# Patient Record
Sex: Male | Born: 1945 | Race: White | Hispanic: No | Marital: Married | Smoking: Former smoker
Health system: Southern US, Community
[De-identification: ages and names within clinical notes are randomized; demographics above are authoritative.]

## PROBLEM LIST (undated history)

## (undated) DIAGNOSIS — K5790 Diverticulosis of intestine, part unspecified, without perforation or abscess without bleeding: Secondary | ICD-10-CM

## (undated) DIAGNOSIS — M797 Fibromyalgia: Secondary | ICD-10-CM

## (undated) DIAGNOSIS — H269 Unspecified cataract: Secondary | ICD-10-CM

## (undated) DIAGNOSIS — K219 Gastro-esophageal reflux disease without esophagitis: Secondary | ICD-10-CM

## (undated) DIAGNOSIS — E785 Hyperlipidemia, unspecified: Secondary | ICD-10-CM

## (undated) DIAGNOSIS — Z905 Acquired absence of kidney: Secondary | ICD-10-CM

## (undated) DIAGNOSIS — N400 Enlarged prostate without lower urinary tract symptoms: Secondary | ICD-10-CM

## (undated) DIAGNOSIS — I10 Essential (primary) hypertension: Secondary | ICD-10-CM

## (undated) DIAGNOSIS — K635 Polyp of colon: Secondary | ICD-10-CM

## (undated) DIAGNOSIS — J45909 Unspecified asthma, uncomplicated: Secondary | ICD-10-CM

## (undated) HISTORY — DX: Hyperlipidemia, unspecified: E78.5

## (undated) HISTORY — DX: Essential (primary) hypertension: I10

## (undated) HISTORY — PX: OTHER SURGICAL HISTORY: SHX169

## (undated) HISTORY — DX: Acquired absence of kidney: Z90.5

## (undated) HISTORY — PX: APPENDECTOMY: SHX54

## (undated) HISTORY — DX: Polyp of colon: K63.5

## (undated) HISTORY — DX: Gastro-esophageal reflux disease without esophagitis: K21.9

## (undated) HISTORY — DX: Diverticulosis of intestine, part unspecified, without perforation or abscess without bleeding: K57.90

## (undated) HISTORY — PX: EYE SURGERY: SHX253

## (undated) HISTORY — PX: COLONOSCOPY W/ POLYPECTOMY: SHX1380

## (undated) HISTORY — DX: Unspecified asthma, uncomplicated: J45.909

## (undated) HISTORY — DX: Fibromyalgia: M79.7

## (undated) HISTORY — DX: Benign prostatic hyperplasia without lower urinary tract symptoms: N40.0

## (undated) HISTORY — PX: CHOLECYSTECTOMY: SHX55

## (undated) HISTORY — DX: Unspecified cataract: H26.9

## (undated) HISTORY — PX: ROTATOR CUFF REPAIR: SHX139

---

## 1994-04-03 HISTORY — PX: NEPHRECTOMY: SHX65

## 2010-09-08 ENCOUNTER — Encounter: Payer: Self-pay | Admitting: Physician Assistant

## 2010-09-09 ENCOUNTER — Encounter: Payer: Self-pay | Admitting: Physician Assistant

## 2011-04-06 DIAGNOSIS — E559 Vitamin D deficiency, unspecified: Secondary | ICD-10-CM | POA: Diagnosis not present

## 2011-04-06 DIAGNOSIS — E119 Type 2 diabetes mellitus without complications: Secondary | ICD-10-CM | POA: Diagnosis not present

## 2011-04-07 DIAGNOSIS — E119 Type 2 diabetes mellitus without complications: Secondary | ICD-10-CM | POA: Diagnosis not present

## 2011-07-28 DIAGNOSIS — N400 Enlarged prostate without lower urinary tract symptoms: Secondary | ICD-10-CM | POA: Diagnosis not present

## 2011-08-02 DIAGNOSIS — E109 Type 1 diabetes mellitus without complications: Secondary | ICD-10-CM | POA: Diagnosis not present

## 2011-08-02 DIAGNOSIS — H2510 Age-related nuclear cataract, unspecified eye: Secondary | ICD-10-CM | POA: Diagnosis not present

## 2011-08-02 DIAGNOSIS — H40019 Open angle with borderline findings, low risk, unspecified eye: Secondary | ICD-10-CM | POA: Diagnosis not present

## 2011-10-09 DIAGNOSIS — IMO0001 Reserved for inherently not codable concepts without codable children: Secondary | ICD-10-CM | POA: Diagnosis not present

## 2011-10-09 DIAGNOSIS — I10 Essential (primary) hypertension: Secondary | ICD-10-CM | POA: Diagnosis not present

## 2011-10-09 DIAGNOSIS — E785 Hyperlipidemia, unspecified: Secondary | ICD-10-CM | POA: Diagnosis not present

## 2011-10-09 DIAGNOSIS — E559 Vitamin D deficiency, unspecified: Secondary | ICD-10-CM | POA: Diagnosis not present

## 2012-01-10 DIAGNOSIS — E785 Hyperlipidemia, unspecified: Secondary | ICD-10-CM | POA: Diagnosis not present

## 2012-01-10 DIAGNOSIS — IMO0001 Reserved for inherently not codable concepts without codable children: Secondary | ICD-10-CM | POA: Diagnosis not present

## 2012-01-10 DIAGNOSIS — R05 Cough: Secondary | ICD-10-CM | POA: Diagnosis not present

## 2012-01-10 DIAGNOSIS — R059 Cough, unspecified: Secondary | ICD-10-CM | POA: Diagnosis not present

## 2012-01-10 DIAGNOSIS — Z23 Encounter for immunization: Secondary | ICD-10-CM | POA: Diagnosis not present

## 2012-01-10 DIAGNOSIS — I10 Essential (primary) hypertension: Secondary | ICD-10-CM | POA: Diagnosis not present

## 2012-04-16 DIAGNOSIS — E559 Vitamin D deficiency, unspecified: Secondary | ICD-10-CM | POA: Diagnosis not present

## 2012-04-16 DIAGNOSIS — Z125 Encounter for screening for malignant neoplasm of prostate: Secondary | ICD-10-CM | POA: Diagnosis not present

## 2012-04-16 DIAGNOSIS — I10 Essential (primary) hypertension: Secondary | ICD-10-CM | POA: Diagnosis not present

## 2012-04-16 DIAGNOSIS — M109 Gout, unspecified: Secondary | ICD-10-CM | POA: Diagnosis not present

## 2012-04-16 DIAGNOSIS — E785 Hyperlipidemia, unspecified: Secondary | ICD-10-CM | POA: Diagnosis not present

## 2012-04-16 DIAGNOSIS — IMO0001 Reserved for inherently not codable concepts without codable children: Secondary | ICD-10-CM | POA: Diagnosis not present

## 2012-04-16 DIAGNOSIS — N400 Enlarged prostate without lower urinary tract symptoms: Secondary | ICD-10-CM | POA: Diagnosis not present

## 2012-07-22 ENCOUNTER — Encounter: Payer: Self-pay | Admitting: Family Medicine

## 2012-07-22 ENCOUNTER — Encounter: Payer: Self-pay | Admitting: *Deleted

## 2012-07-22 ENCOUNTER — Ambulatory Visit (INDEPENDENT_AMBULATORY_CARE_PROVIDER_SITE_OTHER): Payer: Medicare Other | Admitting: Family Medicine

## 2012-07-22 VITALS — BP 125/82 | HR 61 | Temp 97.8°F | Ht 67.0 in | Wt 186.8 lb

## 2012-07-22 DIAGNOSIS — Z905 Acquired absence of kidney: Secondary | ICD-10-CM

## 2012-07-22 DIAGNOSIS — I10 Essential (primary) hypertension: Secondary | ICD-10-CM

## 2012-07-22 DIAGNOSIS — Q602 Renal agenesis, unspecified: Secondary | ICD-10-CM | POA: Diagnosis not present

## 2012-07-22 DIAGNOSIS — M109 Gout, unspecified: Secondary | ICD-10-CM

## 2012-07-22 DIAGNOSIS — Q605 Renal hypoplasia, unspecified: Secondary | ICD-10-CM

## 2012-07-22 DIAGNOSIS — E119 Type 2 diabetes mellitus without complications: Secondary | ICD-10-CM

## 2012-07-22 DIAGNOSIS — E8881 Metabolic syndrome: Secondary | ICD-10-CM

## 2012-07-22 DIAGNOSIS — E785 Hyperlipidemia, unspecified: Secondary | ICD-10-CM

## 2012-07-22 LAB — HEPATIC FUNCTION PANEL
ALT: 13 U/L (ref 0–53)
AST: 10 U/L (ref 0–37)
Albumin: 4 g/dL (ref 3.5–5.2)
Alkaline Phosphatase: 71 U/L (ref 39–117)
Bilirubin, Direct: 0.1 mg/dL (ref 0.0–0.3)
Indirect Bilirubin: 0.3 mg/dL (ref 0.0–0.9)
Total Bilirubin: 0.4 mg/dL (ref 0.3–1.2)
Total Protein: 6.9 g/dL (ref 6.0–8.3)

## 2012-07-22 LAB — BASIC METABOLIC PANEL WITH GFR
BUN: 21 mg/dL (ref 6–23)
CO2: 27 mEq/L (ref 19–32)
Calcium: 9.5 mg/dL (ref 8.4–10.5)
Chloride: 103 mEq/L (ref 96–112)
Creat: 1.28 mg/dL (ref 0.50–1.35)
GFR, Est African American: 67 mL/min
GFR, Est Non African American: 58 mL/min — ABNORMAL LOW
Glucose, Bld: 208 mg/dL — ABNORMAL HIGH (ref 70–99)
Potassium: 5.2 mEq/L (ref 3.5–5.3)
Sodium: 138 mEq/L (ref 135–145)

## 2012-07-22 LAB — URIC ACID: Uric Acid, Serum: 5.6 mg/dL (ref 4.0–6.0)

## 2012-07-22 LAB — POCT UA - MICROALBUMIN: Microalbumin Ur, POC: POSITIVE mg/dL

## 2012-07-22 NOTE — Progress Notes (Signed)
Patient ID: Jeffrey Rubio, male   DOB: Nov 11, 1945, 67 y.o.   MRN: [id number] SUBJECTIVE: HPI: Patient is here for follow up of Diabetes Mellitus/ Hyperlipidemia/hypertension. .Symptoms of DM:has had no Nocturia ,deniesUrinary Frequency ,denies Blurred vision ,deniesDizziness,denies.Dysuria,deniesparesthesias, deniesextremity pain or ulcers.Marland Kitchendenieschest pain. .has hadan annual eye exam. do check the feet. doescheck CBGs. Average CBG:_216_______.Marland Kitchen deniesto episodes of hypoglycemia. doeshave an emergency hypoglycemic plan. admits toCompliance with medications. deniesProblems with medications. Denies compliance with diet. Loves to eat.   PMH/PSH: reviewed/updated in Epic  SH/FH: reviewed/updated in Epic  Allergies: reviewed/updated in Epic  Medications: reviewed/updated in Epic  Immunizations: reviewed/updated in Epic  ROS: As above in the HPI. All other systems are stable or negative.  OBJECTIVE: APPEARANCE:  Patient in no acute distress.The patient appeared well nourished and normally developed. Acyanotic. Waist: 40 3/4 inches, central obesity VITAL SIGNS:BP 125/82  Pulse 61  Temp(Src) 97.8 F (36.6 C) (Oral)  Ht 5\' 7"  (1.702 m)  Wt 186 lb 12.8 oz (84.732 kg)  BMI 29.25 kg/m2   SKIN: warm and  Dry without overt rashes, tattoos and scars  HEAD and Neck: without JVD, Head and scalp: normal Eyes:No scleral icterus. Fundi normal, eye movements normal. Ears: Auricle normal, canal normal, Tympanic membranes normal, insufflation normal. Nose: normal Throat: normal Neck & thyroid: normal  CHEST & LUNGS: Chest wall: normal Lungs: Clear  CVS: Reveals the PMI to be normally located. Regular rhythm, First and Second Heart sounds are normal,  absence of murmurs, rubs or gallops. Peripheral vasculature: Radial pulses: normal Dorsal pedis pulses: normal Posterior pulses: normal  ABDOMEN:  Appearance: normal Benign,, no organomegaly, no masses, no Abdominal Aortic  enlargement. No Guarding , no rebound. No Bruits. Bowel sounds: normal  RECTAL: N/A GU: N/A  EXTREMETIES: nonedematous. Both Femoral and Pedal pulses are normal.  MUSCULOSKELETAL:  Spine: normal Joints: intact  NEUROLOGIC: oriented to time,place and person; nonfocal. Strength is normal Sensory is normal Reflexes are normal Cranial Nerves are normal.  ASSESSMENT: Type II or unspecified type diabetes mellitus without mention of complication, not stated as uncontrolled - Plan: POCT glycosylated hemoglobin (Hb A1C), POCT UA - Microalbumin, Microalbumin, urine  Single kidney  HTN (hypertension) - Plan: BASIC METABOLIC PANEL WITH GFR  HLD (hyperlipidemia) - Plan: Hepatic function panel, NMR Lipoprofile with Lipids  Gout - Plan: Uric acid  Metabolic syndrome  When he was young he had stones in his kidney and it was barely surviving and  Eventually it died.  PLAN: Orders Placed This Encounter  Procedures  . BASIC METABOLIC PANEL WITH GFR  . Hepatic function panel  . NMR Lipoprofile with Lipids  . Uric acid  . Microalbumin, urine  . POCT glycosylated hemoglobin (Hb A1C)  . POCT UA - Microalbumin   Results for orders placed in visit on 07/22/12 (from the past 24 hour(s))  POCT UA - MICROALBUMIN     Status: None   Collection Time    07/22/12 10:34 AM      Result Value Range   Microalbumin Ur, POC positive     Meds ordered this encounter  Medications  . amLODipine (NORVASC) 5 MG tablet    Sig:   . benazepril (LOTENSIN) 10 MG tablet    Sig:   . pioglitazone (ACTOS) 15 MG tablet    Sig:   . JANUVIA 100 MG tablet    Sig:   . traMADol (ULTRAM) 50 MG tablet    Sig:   counselled for 35 minutes on diet exercise and weight loss,  and therapeutic lifestyle changes. Handouts given in the AVS and  Discussed these as well. Annual eye exam. Daily foot checks. RTC in 3 months. Await labs.      Dr Woodroe Mode Recommendations  Diet and Exercise discussed with  patient.  For nutrition information, I recommend books:  1).Eat to Live by Dr Monico Hoar. 2).Prevent and Reverse Heart Disease by Dr Suzzette Righter. 3.) Reversing Diabetes by Dr Lequita Asal. Exercise recommendations are:  If unable to walk, then the patient can exercise in a chair 3 times a day. By flapping arms like a bird gently and raising legs outwards to the front.  If ambulatory, the patient can go for walks for 30 minutes 3 times a week. Then increase the intensity and duration as tolerated.  Goal is to try to attain exercise frequency to 5 times a week.  If applicable: Best to perform resistance exercises (machines or weights) 2 days a week and cardio type exercises 3 days per week.  Omega Durante P. [place] Charon, M.D.

## 2012-07-22 NOTE — Patient Instructions (Addendum)
Dr Woodroe Mode Recommendations  Diet and Exercise discussed with patient.  For nutrition information, I recommend books:  1).Eat to Live by Dr Monico Hoar. 2).Prevent and Reverse Heart Disease by Dr Suzzette Righter. 3.) Reversing Diabetes by Dr Lequita Asal. Exercise recommendations are:  If unable to walk, then the patient can exercise in a chair 3 times a day. By flapping arms like a bird gently and raising legs outwards to the front.  If ambulatory, the patient can go for walks for 30 minutes 3 times a week. Then increase the intensity and duration as tolerated.  Goal is to try to attain exercise frequency to 5 times a week.  If applicable: Best to perform resistance exercises (machines or weights) 2 days a week and cardio type exercises 3 days per week.  Diabetes and Exercise Regular exercise is important and can help:   Control blood glucose (sugar).  Decrease blood pressure.    Control blood lipids (cholesterol, triglycerides).  Improve overall health. BENEFITS FROM EXERCISE  Improved fitness.  Improved flexibility.  Improved endurance.  Increased bone density.  Weight control.  Increased muscle strength.  Decreased body fat.  Improvement of the body's use of insulin, a hormone.  Increased insulin sensitivity.  Reduction of insulin needs.  Reduced stress and tension.  Helps you feel better. People with diabetes who add exercise to their lifestyle gain additional benefits, including:  Weight loss.  Reduced appetite.  Improvement of the body's use of blood glucose.  Decreased risk factors for heart disease:  Lowering of cholesterol and triglycerides.  Raising the level of good cholesterol (high-density lipoproteins, HDL).  Lowering blood sugar.  Decreased blood pressure. TYPE 1 DIABETES AND EXERCISE  Exercise will usually lower your blood glucose.  If blood glucose is greater than 240 mg/dl, check urine ketones. If ketones  are present, do not exercise.  Location of the insulin injection sites may need to be adjusted with exercise. Avoid injecting insulin into areas of the body that will be exercised. For example, avoid injecting insulin into:  The arms when playing tennis.  The legs when jogging. For more information, discuss this with your caregiver.  Keep a record of:  Food intake.  Type and amount of exercise.  Expected peak times of insulin action.  Blood glucose levels. Do this before, during, and after exercise. Review your records with your caregiver. This will help you to develop guidelines for adjusting food intake and insulin amounts.  TYPE 2 DIABETES AND EXERCISE  Regular physical activity can help control blood glucose.  Exercise is important because it may:  Increase the body's sensitivity to insulin.  Improve blood glucose control.  Exercise reduces the risk of heart disease. It decreases serum cholesterol and triglycerides. It also lowers blood pressure.  Those who take insulin or oral hypoglycemic agents should watch for signs of hypoglycemia. These signs include dizziness, shaking, sweating, chills, and confusion.  Body water is lost during exercise. It must be replaced. This will help to avoid loss of body fluids (dehydration) or heat stroke. Be sure to talk to your caregiver before starting an exercise program to make sure it is safe for you. Remember, any activity is better than none.  Document Released: 06/10/2003 Document Revised: 06/12/2011 Document Reviewed: 09/24/2008 [facility] Patient Information 2013 [place], [place].   Diabetes, Type 2 Diabetes is a long-lasting (chronic) disease. In type 2 diabetes, the pancreas does not make enough insulin (a hormone), and the body does not respond normally  to the insulin that is made. This type of diabetes was also previously called adult-onset diabetes. It usually occurs after the age of 20, but it can occur at any age.  CAUSES   Type 2 diabetes happens because the pancreasis not making enough insulin or your body has trouble using the insulin that your pancreas does make properly. SYMPTOMS   Drinking more than usual.  Urinating more than usual.  Blurred vision.  Dry, itchy skin.  Frequent infections.  Feeling more tired than usual (fatigue). DIAGNOSIS The diagnosis of type 2 diabetes is usually made by one of the following tests:  Fasting blood glucose test. You will not eat for at least 8 hours and then take a blood test.  Random blood glucose test. Your blood glucose (sugar) is checked at any time of the day regardless of when you ate.  Oral glucose tolerance test (OGTT). Your blood glucose is measured after you have not eaten (fasted) and then after you drink a glucose containing beverage. TREATMENT   Healthy eating.  Exercise.  Medicine, if needed.  Monitoring blood glucose.  Seeing your caregiver regularly. HOME CARE INSTRUCTIONS   Check your blood glucose at least once a day. More frequent monitoring may be necessary, depending on your medicines and on how well your diabetes is controlled. Your caregiver will advise you.  Take your medicine as directed by your caregiver.  Do not smoke.  Make wise food choices. Ask your caregiver for information. Weight loss can improve your diabetes.  Learn about low blood glucose (hypoglycemia) and how to treat it.  Get your eyes checked regularly.  Have a yearly physical exam. Have your blood pressure checked and your blood and urine tested.  Wear a pendant or bracelet saying that you have diabetes.  Check your feet every night for cuts, sores, blisters, and redness. Let your caregiver know if you have any problems. SEEK MEDICAL CARE IF:   You have problems keeping your blood glucose in target range.  You have problems with your medicines.  You have symptoms of an illness that do not improve after 24 hours.  You have a sore or wound that  is not healing.  You notice a change in vision or a new problem with your vision.  You have a fever. MAKE SURE YOU:  Understand these instructions.  Will watch your condition.  Will get help right away if you are not doing well or get worse. Document Released: 03/20/2005 Document Revised: 06/12/2011 Document Reviewed: 09/05/2010 [facility] Patient Information 2013 [place], [place].

## 2012-07-23 LAB — NMR LIPOPROFILE WITH LIPIDS
Cholesterol, Total: 175 mg/dL (ref <200)
HDL Particle Number: 42.5 umol/L (ref >=30.5)
HDL Size: 8.6 nm — ABNORMAL LOW (ref >=9.2)
HDL-C: 44 mg/dL (ref >=40)
LDL (calc): 85 mg/dL (ref <100)
LDL Particle Number: 866 nmol/L (ref <1000)
LDL Size: 19.9 nm — ABNORMAL LOW (ref >20.5)
LP-IR Score: 65 — ABNORMAL HIGH (ref <=45)
Large HDL-P: 3.6 umol/L — ABNORMAL LOW (ref >=4.8)
Large VLDL-P: 6 nmol/L — ABNORMAL HIGH (ref <=2.7)
Small LDL Particle Number: 666 nmol/L — ABNORMAL HIGH (ref <=527)
Triglycerides: 228 mg/dL — ABNORMAL HIGH (ref <150)
VLDL Size: 42 nm (ref <=46.6)

## 2012-07-23 LAB — MICROALBUMIN, URINE: Microalb, Ur: 2.39 mg/dL — ABNORMAL HIGH (ref 0.00–1.89)

## 2012-07-24 ENCOUNTER — Other Ambulatory Visit: Payer: Self-pay | Admitting: Physician Assistant

## 2012-07-24 NOTE — Telephone Encounter (Signed)
PLEASE PRINT. MAIL ORDER. NOTIFY PT WHEN READY. LAST OV 07/22/12.

## 2012-08-03 ENCOUNTER — Telehealth: Payer: Self-pay

## 2012-08-03 ENCOUNTER — Encounter: Payer: Self-pay | Admitting: Family Medicine

## 2012-08-03 NOTE — Telephone Encounter (Signed)
April Do you have results on his Hgba1c   Thanks

## 2012-08-05 DIAGNOSIS — H40019 Open angle with borderline findings, low risk, unspecified eye: Secondary | ICD-10-CM | POA: Diagnosis not present

## 2012-08-05 DIAGNOSIS — H2510 Age-related nuclear cataract, unspecified eye: Secondary | ICD-10-CM | POA: Diagnosis not present

## 2012-08-05 DIAGNOSIS — E109 Type 1 diabetes mellitus without complications: Secondary | ICD-10-CM | POA: Diagnosis not present

## 2012-08-06 ENCOUNTER — Telehealth: Payer: Self-pay | Admitting: Family Medicine

## 2012-08-06 LAB — POCT GLYCOSYLATED HEMOGLOBIN (HGB A1C): Hemoglobin A1C: 8.7

## 2012-08-07 ENCOUNTER — Other Ambulatory Visit: Payer: Self-pay | Admitting: Family Medicine

## 2012-08-07 ENCOUNTER — Other Ambulatory Visit: Payer: Self-pay | Admitting: *Deleted

## 2012-08-07 DIAGNOSIS — E119 Type 2 diabetes mellitus without complications: Secondary | ICD-10-CM

## 2012-08-07 MED ORDER — GLIMEPIRIDE 4 MG PO TABS: 4.0000 mg | ORAL_TABLET | Freq: Every day | ORAL | Status: DC

## 2012-08-07 MED ORDER — GLIMEPIRIDE 4 MG PO TABS: 2.0000 mg | ORAL_TABLET | Freq: Every day | ORAL | Status: DC

## 2012-08-07 MED ORDER — PIOGLITAZONE HCL 30 MG PO TABS: 30.0000 mg | ORAL_TABLET | Freq: Every day | ORAL | Status: DC

## 2012-08-07 NOTE — Telephone Encounter (Signed)
rx is up front for mail order [name] notified him through [employer]

## 2012-08-08 ENCOUNTER — Telehealth: Payer: Self-pay

## 2012-08-08 DIAGNOSIS — E119 Type 2 diabetes mellitus without complications: Secondary | ICD-10-CM

## 2012-08-08 MED ORDER — SITAGLIPTIN PHOSPHATE 100 MG PO TABS: 100.0000 mg | ORAL_TABLET | Freq: Every day | ORAL | Status: DC

## 2012-08-08 NOTE — Telephone Encounter (Signed)
Pt picked up rx januvia 100mg  90 day supple

## 2012-10-22 ENCOUNTER — Encounter: Payer: Self-pay | Admitting: Family Medicine

## 2012-10-22 ENCOUNTER — Ambulatory Visit (INDEPENDENT_AMBULATORY_CARE_PROVIDER_SITE_OTHER): Payer: Medicare Other | Admitting: Family Medicine

## 2012-10-22 VITALS — BP 127/80 | HR 57 | Temp 97.0°F | Ht 65.75 in | Wt 185.0 lb

## 2012-10-22 DIAGNOSIS — E1169 Type 2 diabetes mellitus with other specified complication: Secondary | ICD-10-CM | POA: Insufficient documentation

## 2012-10-22 DIAGNOSIS — E785 Hyperlipidemia, unspecified: Secondary | ICD-10-CM

## 2012-10-22 DIAGNOSIS — E1159 Type 2 diabetes mellitus with other circulatory complications: Secondary | ICD-10-CM | POA: Insufficient documentation

## 2012-10-22 DIAGNOSIS — I10 Essential (primary) hypertension: Secondary | ICD-10-CM

## 2012-10-22 DIAGNOSIS — E119 Type 2 diabetes mellitus without complications: Secondary | ICD-10-CM | POA: Diagnosis not present

## 2012-10-22 DIAGNOSIS — I152 Hypertension secondary to endocrine disorders: Secondary | ICD-10-CM | POA: Insufficient documentation

## 2012-10-22 LAB — COMPLETE METABOLIC PANEL WITH GFR
ALT: 13 U/L (ref 0–53)
AST: 11 U/L (ref 0–37)
Albumin: 4.3 g/dL (ref 3.5–5.2)
Alkaline Phosphatase: 63 U/L (ref 39–117)
BUN: 23 mg/dL (ref 6–23)
CO2: 24 mEq/L (ref 19–32)
Calcium: 10 mg/dL (ref 8.4–10.5)
Chloride: 104 mEq/L (ref 96–112)
Creat: 1.21 mg/dL (ref 0.50–1.35)
GFR, Est African American: 72 mL/min
GFR, Est Non African American: 62 mL/min
Glucose, Bld: 118 mg/dL — ABNORMAL HIGH (ref 70–99)
Potassium: 5 mEq/L (ref 3.5–5.3)
Sodium: 137 mEq/L (ref 135–145)
Total Bilirubin: 0.4 mg/dL (ref 0.3–1.2)
Total Protein: 7 g/dL (ref 6.0–8.3)

## 2012-10-22 LAB — POCT UA - MICROALBUMIN: Microalbumin Ur, POC: 20 mg/L

## 2012-10-22 LAB — POCT GLYCOSYLATED HEMOGLOBIN (HGB A1C): Hemoglobin A1C: 6.7

## 2012-10-22 MED ORDER — BENAZEPRIL HCL 10 MG PO TABS: 10.0000 mg | ORAL_TABLET | Freq: Every day | ORAL | Status: DC

## 2012-10-22 MED ORDER — AMLODIPINE BESYLATE 5 MG PO TABS: 5.0000 mg | ORAL_TABLET | Freq: Every day | ORAL | Status: DC

## 2012-10-22 MED ORDER — PIOGLITAZONE HCL 30 MG PO TABS: 30.0000 mg | ORAL_TABLET | Freq: Every day | ORAL | Status: DC

## 2012-10-22 MED ORDER — SITAGLIPTIN PHOSPHATE 100 MG PO TABS: 100.0000 mg | ORAL_TABLET | Freq: Every day | ORAL | Status: DC

## 2012-10-22 MED ORDER — PRAVASTATIN SODIUM 80 MG PO TABS: 80.0000 mg | ORAL_TABLET | Freq: Every day | ORAL | Status: DC

## 2012-10-22 MED ORDER — GLIMEPIRIDE 4 MG PO TABS: 4.0000 mg | ORAL_TABLET | Freq: Every day | ORAL | Status: DC

## 2012-10-22 NOTE — Patient Instructions (Addendum)
      Dr Azzie Thiem's Recommendations  Diet and Exercise discussed with patient.  For nutrition information, I recommend books:  1).Eat to Live by Dr Joel Fuhrman. 2).Prevent and Reverse Heart Disease by Dr Caldwell Esselstyn. 3) Dr Neal Barnard's Book:  Program to Reverse Diabetes  Exercise recommendations are:  If unable to walk, then the patient can exercise in a chair 3 times a day. By flapping arms like a bird gently and raising legs outwards to the front.  If ambulatory, the patient can go for walks for 30 minutes 3 times a week. Then increase the intensity and duration as tolerated.  Goal is to try to attain exercise frequency to 5 times a week.  If applicable: Best to perform resistance exercises (machines or weights) 2 days a week and cardio type exercises 3 days per week.  

## 2012-10-22 NOTE — Progress Notes (Signed)
Patient ID: Jeffrey Rubio, male   DOB: [birth year], 67 y.o.   MRN: [id number] SUBJECTIVE: CC: Chief Complaint  Patient presents with  . Follow-up    3 month follow up fasting  needs written rx 90 day concerned about diabetes     HPI: 1) Patient is here for follow up of Diabetes Mellitus: Symptoms of DM: Denies Nocturia ,Denies Urinary Frequency , denies Blurred vision ,deniesDizziness,denies.Dysuria,denies paresthesias, denies extremity pain or ulcers.Marland Kitchendenies chest pain. has had an annual eye exam. do check the feet. Does check CBGs. Average CBG: Denies episodes of hypoglycemia. Does have an emergency hypoglycemic plan. admits toCompliance with medications. Denies Problems with medications.  2) Patient is here for follow up of hypertension: denies Headache;deniesChest Pain;denies weakness;denies Shortness of Breath or Orthopnea;denies Visual changes;denies palpitations;denies cough;denies pedal edema;denies symptoms of TIA or stroke; admits to Compliance with medications. denies Problems with medications.  3) Hyperlipidemia   Past Medical History  Diagnosis Date  . Hypertension   . Diabetes mellitus   . Hyperlipidemia   . Asthmatic bronchitis   . Fibromyalgia   . BPH (benign prostatic hypertrophy)   . Single kidney   . History of nephrectomy, unilateral     left   . Gout   . Colon polyps    Past Surgical History  Procedure Laterality Date  . Nephrectomy  1996    left   . Cholecystectomy     History   Social History  . Marital Status: Married    Spouse Name: N/A    Number of Children: N/A  . Years of Education: N/A   Occupational History  . Not on file.   Social History Main Topics  . Smoking status: Former Smoker    Types: Cigarettes    Quit date: 04/03/1973  . Smokeless tobacco: Not on file  . Alcohol Use: Not on file  . Drug Use: Not on file  . Sexually Active: Not on file   Other Topics Concern  . Not on file   Social History Narrative  .  No narrative on file   Family History  Problem Relation Age of Onset  . Coronary artery disease      family history   . Hypertension      family history    Current Outpatient Prescriptions on File Prior to Visit  Medication Sig Dispense Refill  . Ascorbic Acid (VITAMIN C) 1000 MG tablet Take 1,000 mg by mouth daily.        . Cholecalciferol (VITAMIN D) 2000 UNITS tablet Take 2,000 Units by mouth daily.        Marland Kitchen HYDROcodone-acetaminophen (VICODIN) 5-500 MG per tablet Take 1 tablet by mouth every 6 (six) hours as needed.        . zinc gluconate 50 MG tablet Take 50 mg by mouth daily.        Marland Kitchen albuterol (PROVENTIL,VENTOLIN) 90 MCG/ACT inhaler Inhale 2 puffs into the lungs 4 (four) times daily as needed.        . traMADol (ULTRAM) 50 MG tablet       . valACYclovir (VALTREX) 1000 MG tablet Take 1,000 mg by mouth 2 (two) times daily.         No current facility-administered medications on file prior to visit.   Allergies  Allergen Reactions  . Glimepiride     Pt stated not allergic to this med   . Metformin And Related     Pt stated not allergic to med  . Viibryd (Vilazodone Hcl)  There is no immunization history on file for this patient. Prior to Admission medications   Medication Sig Start Date End Date Taking? Authorizing Provider  Ascorbic Acid (VITAMIN C) 1000 MG tablet Take 1,000 mg by mouth daily.     Yes Historical Provider, MD  Cholecalciferol (VITAMIN D) 2000 UNITS tablet Take 2,000 Units by mouth daily.     Yes Historical Provider, MD  glimepiride (AMARYL) 4 MG tablet Take 1 tablet (4 mg total) by mouth daily before breakfast. 10/22/12  Yes Ileana Ladd, MD  HYDROcodone-acetaminophen (VICODIN) 5-500 MG per tablet Take 1 tablet by mouth every 6 (six) hours as needed.     Yes Historical Provider, MD  pioglitazone (ACTOS) 30 MG tablet Take 1 tablet (30 mg total) by mouth daily. 10/22/12  Yes Ileana Ladd, MD  pravastatin (PRAVACHOL) 80 MG tablet Take 1 tablet (80 mg  total) by mouth daily. 10/22/12  Yes Ileana Ladd, MD  sitaGLIPtin (JANUVIA) 100 MG tablet Take 1 tablet (100 mg total) by mouth daily. 10/22/12  Yes Ileana Ladd, MD  zinc gluconate 50 MG tablet Take 50 mg by mouth daily.     Yes Historical Provider, MD  albuterol (PROVENTIL,VENTOLIN) 90 MCG/ACT inhaler Inhale 2 puffs into the lungs 4 (four) times daily as needed.      Historical Provider, MD  amLODipine (NORVASC) 5 MG tablet Take 1 tablet (5 mg total) by mouth daily. 10/22/12   Ileana Ladd, MD  benazepril (LOTENSIN) 10 MG tablet Take 1 tablet (10 mg total) by mouth daily. 10/22/12   Ileana Ladd, MD  traMADol Janean Sark) 50 MG tablet  04/23/12   Historical Provider, MD  valACYclovir (VALTREX) 1000 MG tablet Take 1,000 mg by mouth 2 (two) times daily.      Historical Provider, MD    ROS: As above in the HPI. All other systems are stable or negative.  OBJECTIVE: APPEARANCE:  Patient in no acute distress.The patient appeared well nourished and normally developed. Acyanotic. Waist: VITAL SIGNS:  SKIN: warm and  Dry without overt rashes, tattoos and scars  HEAD and Neck: without JVD, Head and scalp: normal Eyes:No scleral icterus. Fundi normal, eye movements normal. Ears: Auricle normal, canal normal, Tympanic membranes normal, insufflation normal. Nose: normal Throat: normal Neck & thyroid: normal  CHEST & LUNGS: Chest wall: normal Lungs: Clear  CVS: Reveals the PMI to be normally located. Regular rhythm, First and Second Heart sounds are normal,  absence of murmurs, rubs or gallops. Peripheral vasculature: Radial pulses: normal Dorsal pedis pulses: normal Posterior pulses: normal  ABDOMEN:  Appearance: normal Benign, no organomegaly, no masses, no Abdominal Aortic enlargement. No Guarding , no rebound. No Bruits. Bowel sounds: normal  RECTAL: N/A GU: N/A  EXTREMETIES: nonedematous. Both Femoral and Pedal pulses are normal.  MUSCULOSKELETAL:  Spine:  normal Joints: intact  NEUROLOGIC: oriented to time,place and person; nonfocal. Strength is normal Sensory is normal Reflexes are normal Cranial Nerves are normal.  ASSESSMENT: DM (diabetes mellitus) - Plan: POCT glycosylated hemoglobin (Hb A1C), COMPLETE METABOLIC PANEL WITH GFR, POCT UA - Microalbumin, glimepiride (AMARYL) 4 MG tablet, pioglitazone (ACTOS) 30 MG tablet, sitaGLIPtin (JANUVIA) 100 MG tablet, Microalbumin, urine  Diabetes - Plan: sitaGLIPtin (JANUVIA) 100 MG tablet, Microalbumin, urine  HLD (hyperlipidemia) - Plan: COMPLETE METABOLIC PANEL WITH GFR, NMR Lipoprofile with Lipids, pravastatin (PRAVACHOL) 80 MG tablet  HTN (hypertension) - Plan: COMPLETE METABOLIC PANEL WITH GFR, amLODipine (NORVASC) 5 MG tablet, benazepril (LOTENSIN) 10 MG tablet  PLAN: Orders  Placed This Encounter  Procedures  . COMPLETE METABOLIC PANEL WITH GFR  . NMR Lipoprofile with Lipids  . Microalbumin, urine  . POCT glycosylated hemoglobin (Hb A1C)  . POCT UA - Microalbumin   Meds ordered this encounter  Medications  . DISCONTD: glimepiride (AMARYL) 4 MG tablet    Sig: 4 mg.  . glimepiride (AMARYL) 4 MG tablet    Sig: Take 1 tablet (4 mg total) by mouth daily before breakfast.    Dispense:  90 tablet    Refill:  3  . pioglitazone (ACTOS) 30 MG tablet    Sig: Take 1 tablet (30 mg total) by mouth daily.    Dispense:  90 tablet    Refill:  3  . pravastatin (PRAVACHOL) 80 MG tablet    Sig: Take 1 tablet (80 mg total) by mouth daily.    Dispense:  90 tablet    Refill:  3  . sitaGLIPtin (JANUVIA) 100 MG tablet    Sig: Take 1 tablet (100 mg total) by mouth daily.    Dispense:  90 tablet    Refill:  3  . amLODipine (NORVASC) 5 MG tablet    Sig: Take 1 tablet (5 mg total) by mouth daily.    Dispense:  90 tablet    Refill:  3  . benazepril (LOTENSIN) 10 MG tablet    Sig: Take 1 tablet (10 mg total) by mouth daily.    Dispense:  90 tablet    Refill:  3         Dr Woodroe Mode  Recommendations  Diet and Exercise discussed with patient.  For nutrition information, I recommend books:  1).Eat to Live by Dr Monico Hoar. 2).Prevent and Reverse Heart Disease by Dr Suzzette Righter. 3) Dr Katherina Right Book:  Program to Reverse Diabetes  Exercise recommendations are:  If unable to walk, then the patient can exercise in a chair 3 times a day. By flapping arms like a bird gently and raising legs outwards to the front.  If ambulatory, the patient can go for walks for 30 minutes 3 times a week. Then increase the intensity and duration as tolerated.  Goal is to try to attain exercise frequency to 5 times a week.  If applicable: Best to perform resistance exercises (machines or weights) 2 days a week and cardio type exercises 3 days per week.  Return in about 3 months (around 01/22/2013) for Recheck medical problems.  Benelli Winther P. [place] Charon, M.D.

## 2012-10-23 LAB — MICROALBUMIN, URINE: Microalb, Ur: 0.77 mg/dL (ref 0.00–1.89)

## 2012-10-23 LAB — NMR LIPOPROFILE WITH LIPIDS
Cholesterol, Total: 161 mg/dL (ref <200)
HDL Particle Number: 34.6 umol/L (ref >=30.5)
HDL Size: 8.5 nm — ABNORMAL LOW (ref >=9.2)
HDL-C: 46 mg/dL (ref >=40)
LDL (calc): 80 mg/dL (ref <100)
LDL Particle Number: 753 nmol/L (ref <1000)
LDL Size: 20.2 nm — ABNORMAL LOW (ref >20.5)
LP-IR Score: 67 — ABNORMAL HIGH (ref <=45)
Large HDL-P: <1.3 umol/L — ABNORMAL LOW (ref >=4.8)
Large VLDL-P: 2.7 nmol/L (ref <=2.7)
Small LDL Particle Number: 385 nmol/L (ref <=527)
Triglycerides: 175 mg/dL — ABNORMAL HIGH (ref <150)
VLDL Size: 44.4 nm (ref <=46.6)

## 2012-10-23 NOTE — Progress Notes (Signed)
Quick Note:  Lab result closer to goal. No change in Medications for now. No Change in plans and follow up. Dietary changes and Exercise can bring him to goal and even reduce the amount of medications needed.  He needs to acquire the book Dr Lequita Asal: program to reverse Diabetes. ______

## 2012-11-06 ENCOUNTER — Other Ambulatory Visit: Payer: Self-pay

## 2012-11-15 ENCOUNTER — Encounter: Payer: Self-pay | Admitting: Family Medicine

## 2012-12-03 ENCOUNTER — Encounter: Payer: Self-pay | Admitting: Pharmacist Clinician (PhC)/ Clinical Pharmacy Specialist

## 2012-12-03 ENCOUNTER — Ambulatory Visit (INDEPENDENT_AMBULATORY_CARE_PROVIDER_SITE_OTHER): Payer: Medicare Other | Admitting: Pharmacist Clinician (PhC)/ Clinical Pharmacy Specialist

## 2012-12-03 DIAGNOSIS — E119 Type 2 diabetes mellitus without complications: Secondary | ICD-10-CM

## 2012-12-03 NOTE — Progress Notes (Signed)
Subjective:    Patient ID: Jeffrey Rubio, male    DOB: Sep 26, 1945, 67 y.o.   MRN: [id number]  HPI    Review of Systems  Constitutional: Positive for activity change and fatigue. Negative for fever, chills, diaphoresis, appetite change and unexpected weight change.  HENT: Positive for tinnitus. Negative for hearing loss, ear pain, nosebleeds, congestion, facial swelling, rhinorrhea, sneezing, neck pain, neck stiffness, postnasal drip and ear discharge.   Eyes: Negative.   Respiratory: Negative.   Endocrine: Negative.   Musculoskeletal: Positive for myalgias and back pain.  Psychiatric/Behavioral: Negative.        Objective:   Physical Exam  Constitutional: He is oriented to person, place, and time. He appears well-developed and well-nourished.  Cardiovascular: Normal rate and regular rhythm.   Musculoskeletal: He exhibits no edema and no tenderness.  Neurological: He is alert and oriented to person, place, and time.  Skin: Skin is warm and dry.  Psychiatric: He has a normal mood and affect. His behavior is normal. Judgment and thought content normal.          Assessment & Plan:   Diabetes Follow-Up Visit Chief Complaint:  No chief complaint on file.    Exam Regularity:  RRR Edema:  neg  Polyuria:  neg  Polydipsia:  neg Polyphagia:  neg  BMI:  There is no weight on file to calculate BMI.   Weight changes:  increase General Appearance:  alert, oriented, no acute distress Mood/Affect:  normal  HPI:  Type 2 Diabetes for years  Low fat/carbohydrate diet?  Yes Nicotine Abuse?  No Medication Compliance?  No Exercise?  Yes Alcohol Abuse?  No  Home BG Monitoring:  Checking 2 times a day. Average:  120  High: 200  Low:  100   Lab Results  Component Value Date   HGBA1C 6.7% 10/22/2012    Lab Results  Component Value Date   MICROALBUR 0.77 10/22/2012    Lab Results  Component Value Date   LDLCALC 80 10/22/2012   TRIG 175* 10/22/2012      Assessment: 1.   Diabetes.  Excellent improvement in control, patient highly motivated to make changes 2.  Blood Pressure.  At goal  3.  Lipids.  Excellent control on pravastatin 80mg  4.  Foot Care.  Daily care reviewed 5.  Dental Care.  annual 6.  Eye Care/Exam.  Just had an eye exam, no problems  Recommendations: 1.  Patient is counseled on appropriate foot care. 2.  BP goal < 130/80. 3.  LDL goal of < 100, HDL > 40 and TG < 150. 4.  Eye Exam yearly and Dental Exam every 6 months. 5.  Dietary recommendations:  1700 calorie ADA diet reviewed with patient with extensive CHO counseling 6.  Physical Activity recommendations:  30-45 minutes of walking a day 7.  Medication recommendations at this time are as follows:  Patient stopped [place] 2 weeks ago due to cost >$400 and also stopped actos this past week.  BG reading still around 120 due to patient changing diet at exercising.  He is still taking glmepiride 4mg  a day.  Add back metformin 500mg  bid, patient took it on an empty stomach last time he tried it and got diarrhea.  He will take it after a full meal and let me know if she still has any problems.  He can cut glimepiride to 2mg  as BG levels fall.  His recent serum creatinine level was 1.2.  Will monitor closely since patient has only  1 kidney. 8.  Return to clinic in 4-6 wks   Time spent counseling patient:  45 minutes  Physician time spent with patient:  0 Referring provider:  Modesto Charon   PharmD:  Chari Manning, [facility]

## 2013-01-28 ENCOUNTER — Ambulatory Visit (INDEPENDENT_AMBULATORY_CARE_PROVIDER_SITE_OTHER): Payer: Medicare Other | Admitting: Family Medicine

## 2013-01-28 ENCOUNTER — Encounter: Payer: Self-pay | Admitting: Family Medicine

## 2013-01-28 ENCOUNTER — Ambulatory Visit: Payer: No Typology Code available for payment source | Admitting: Family Medicine

## 2013-01-28 VITALS — BP 136/79 | HR 53 | Temp 97.2°F | Ht 67.5 in | Wt 184.6 lb

## 2013-01-28 DIAGNOSIS — E785 Hyperlipidemia, unspecified: Secondary | ICD-10-CM | POA: Diagnosis not present

## 2013-01-28 DIAGNOSIS — Z23 Encounter for immunization: Secondary | ICD-10-CM | POA: Diagnosis not present

## 2013-01-28 DIAGNOSIS — E119 Type 2 diabetes mellitus without complications: Secondary | ICD-10-CM | POA: Diagnosis not present

## 2013-01-28 DIAGNOSIS — I10 Essential (primary) hypertension: Secondary | ICD-10-CM

## 2013-01-28 LAB — POCT GLYCOSYLATED HEMOGLOBIN (HGB A1C): Hemoglobin A1C: 6.3

## 2013-01-28 NOTE — Progress Notes (Signed)
Patient ID: Jeffrey Rubio, male   DOB: 01-Jul-1945, 67 y.o.   MRN: [id number] SUBJECTIVE: CC: Chief Complaint  Patient presents with  . Follow-up    3 month follow up saw Tammy and following recommendations     HPI: Patient is here for follow up of Diabetes Mellitus/HTN/HLD: Symptoms evaluated: Nocturia once in a while , Denies Urinary Frequency , denies Blurred vision ,deniesDizziness,denies.Dysuria,denies paresthesias, denies extremity pain or ulcers.Marland Kitchendenies chest pain. has had an annual eye exam. do check the feet. Does check CBGs. Average CBG:120s Denies episodes of hypoglycemia. Does have an emergency hypoglycemic plan. admits toCompliance with medications. Denies Problems with medications.  Doing better since seeing the pharmacist for education and medication adjustment. Feels better.  Daily walks and doing well with this.   Past Medical History  Diagnosis Date  . Hypertension   . Diabetes mellitus   . Hyperlipidemia   . Asthmatic bronchitis   . Fibromyalgia   . BPH (benign prostatic hypertrophy)   . Single kidney   . History of nephrectomy, unilateral     left   . Gout   . Colon polyps    Past Surgical History  Procedure Laterality Date  . Nephrectomy  1996    left   . Cholecystectomy     History   Social History  . Marital Status: Married    Spouse Name: N/A    Number of Children: N/A  . Years of Education: N/A   Occupational History  . Not on file.   Social History Main Topics  . Smoking status: Former Smoker    Types: Cigarettes    Quit date: 04/03/1973  . Smokeless tobacco: Not on file  . Alcohol Use: Not on file  . Drug Use: Not on file  . Sexual Activity: Not on file   Other Topics Concern  . Not on file   Social History Narrative  . No narrative on file   Family History  Problem Relation Age of Onset  . Coronary artery disease      family history   . Hypertension      family history    Current Outpatient Prescriptions on  File Prior to Visit  Medication Sig Dispense Refill  . amLODipine (NORVASC) 5 MG tablet Take 1 tablet (5 mg total) by mouth daily.  90 tablet  3  . Ascorbic Acid (VITAMIN C) 1000 MG tablet Take 1,000 mg by mouth daily.        . benazepril (LOTENSIN) 10 MG tablet Take 1 tablet (10 mg total) by mouth daily.  90 tablet  3  . Cholecalciferol (VITAMIN D) 2000 UNITS tablet Take 2,000 Units by mouth daily.        Marland Kitchen glimepiride (AMARYL) 4 MG tablet Take 1 tablet (4 mg total) by mouth daily before breakfast.  90 tablet  3  . pravastatin (PRAVACHOL) 80 MG tablet Take 1 tablet (80 mg total) by mouth daily.  90 tablet  3  . traMADol (ULTRAM) 50 MG tablet       . zinc gluconate 50 MG tablet Take 50 mg by mouth daily.        . valACYclovir (VALTREX) 1000 MG tablet Take 1,000 mg by mouth 2 (two) times daily.         No current facility-administered medications on file prior to visit.   Allergies  Allergen Reactions  . Glimepiride     Pt stated not allergic to this med   . Metformin And Related  Pt stated not allergic to med  . Viibryd [employer] [facility]]    Immunization History  Administered Date(s) Administered  . Influenza,inj,Quad PF,36+ Mos 01/28/2013  . Pneumococcal Conjugate 01/28/2013  . Zoster 01/09/2013   Prior to Admission medications   Medication Sig Start Date End Date Taking? Authorizing Provider  albuterol (PROVENTIL,VENTOLIN) 90 MCG/ACT inhaler Inhale 2 puffs into the lungs 4 (four) times daily as needed.      Historical Provider, MD  amLODipine (NORVASC) 5 MG tablet Take 1 tablet (5 mg total) by mouth daily. 10/22/12   Ileana Ladd, MD  Ascorbic Acid (VITAMIN C) 1000 MG tablet Take 1,000 mg by mouth daily.      Historical Provider, MD  benazepril (LOTENSIN) 10 MG tablet Take 1 tablet (10 mg total) by mouth daily. 10/22/12   Ileana Ladd, MD  Cholecalciferol (VITAMIN D) 2000 UNITS tablet Take 2,000 Units by mouth daily.      Historical Provider, MD  glimepiride (AMARYL) 4 MG  tablet Take 1 tablet (4 mg total) by mouth daily before breakfast. 10/22/12   Ileana Ladd, MD  HYDROcodone-acetaminophen (VICODIN) 5-500 MG per tablet Take 1 tablet by mouth every 6 (six) hours as needed.      Historical Provider, MD  pravastatin (PRAVACHOL) 80 MG tablet Take 1 tablet (80 mg total) by mouth daily. 10/22/12   Ileana Ladd, MD  traMADol Janean Sark) 50 MG tablet  04/23/12   Historical Provider, MD  valACYclovir (VALTREX) 1000 MG tablet Take 1,000 mg by mouth 2 (two) times daily.      Historical Provider, MD  zinc gluconate 50 MG tablet Take 50 mg by mouth daily.      Historical Provider, MD     ROS: As above in the HPI. All other systems are stable or negative.  OBJECTIVE: APPEARANCE:  Patient in no acute distress.The patient appeared well nourished and normally developed. Acyanotic. Waist: VITAL SIGNS:BP 136/79  Pulse 53  Temp(Src) 97.2 F (36.2 C) (Oral)  Ht 5' 7.5" (1.715 m)  Wt 184 lb 9.6 oz (83.734 kg)  BMI 28.47 kg/m2  WM  SKIN: warm and  Dry without overt rashes, tattoos and scars  HEAD and Neck: without JVD, Head and scalp: normal Eyes:No scleral icterus. Fundi normal, eye movements normal. Ears: Auricle normal, canal normal, Tympanic membranes normal, insufflation normal. Nose: normal Throat: normal Neck & thyroid: normal  CHEST & LUNGS: Chest wall: normal Lungs: Clear  CVS: Reveals the PMI to be normally located. Regular rhythm, First and Second Heart sounds are normal,  absence of murmurs, rubs or gallops. Peripheral vasculature: Radial pulses: normal Dorsal pedis pulses: normal Posterior pulses: normal  ABDOMEN:  Appearance: normal Benign, no organomegaly, no masses, no Abdominal Aortic enlargement. No Guarding , no rebound. No Bruits. Bowel sounds: normal  RECTAL: N/A GU: N/A  EXTREMETIES: nonedematous.  MUSCULOSKELETAL:  Spine: normal Joints: intact  NEUROLOGIC: oriented to time,place and person; nonfocal. Strength is  normal Sensory is normal Reflexes are normal Cranial Nerves are normal.  ASSESSMENT: DM (diabetes mellitus) - Plan: POCT glycosylated hemoglobin (Hb A1C), Pneumococcal conjugate vaccine 13-valent less than 5yo IM, Flu Vaccine QUAD 36+ mos PF IM (Fluarix)  HLD (hyperlipidemia) - Plan: CMP14+EGFR, NMR, lipoprofile  HTN (hypertension) - Plan: CMP14+EGFR, Pneumococcal conjugate vaccine 13-valent less than 5yo IM, Flu Vaccine QUAD 36+ mos PF IM (Fluarix)  PLAN:  Orders Placed This Encounter  Procedures  . Pneumococcal conjugate vaccine 13-valent less than 5yo IM  . Flu Vaccine QUAD 36+  mos PF IM (Fluarix)  . CMP14+EGFR  . NMR, lipoprofile  . POCT glycosylated hemoglobin (Hb A1C)   Meds ordered this encounter  Medications  . metFORMIN (GLUCOPHAGE) 500 MG tablet    Sig: 500 mg 2 (two) times daily with a meal.   . DISCONTD: pioglitazone (ACTOS) 30 MG tablet    Sig:   . DISCONTD: ZOSTAVAX 16109 UNT/0.65ML injection    Sig:    Medications Discontinued During This Encounter  Medication Reason  . albuterol (PROVENTIL,VENTOLIN) 90 MCG/ACT inhaler Completed Course  . HYDROcodone-acetaminophen (VICODIN) 5-500 MG per tablet Completed Course  . pioglitazone (ACTOS) 30 MG tablet Completed Course  . ZOSTAVAX 60454 UNT/0.65ML injection Completed Course       Dr Woodroe Mode Recommendations  For nutrition information, I recommend books:  1).Eat to Live by Dr Monico Hoar. 2).Prevent and Reverse Heart Disease by Dr Suzzette Righter. 3) Dr Katherina Right Book:  Program to Reverse Diabetes  Continue with daily walks and exercise.  Return in about 4 months (around 05/31/2013) for Recheck medical problems.  Zorianna Taliaferro P. Modesto Charon, M.D.

## 2013-01-28 NOTE — Patient Instructions (Signed)
      Dr Woodroe Mode Recommendations  For nutrition information, I recommend books:  1).Eat to Live by Dr Monico Hoar. 2).Prevent and Reverse Heart Disease by Dr Suzzette Righter. 3) Dr Katherina Right Book:  Program to Reverse Diabetes  Diabetes and Foot Care Diabetes may cause you to have a poor blood supply (circulation) to your legs and feet. Because of this, the skin may be thinner, break easier, and heal more slowly. You also may have nerve damage in your legs and feet causing decreased feeling. You may not notice minor injuries to your feet that could lead to serious problems or infections. Taking care of your feet is one of the most important things you can do for yourself.  HOME CARE INSTRUCTIONS  Do not go barefoot. Bare feet are easily injured.  Check your feet daily for blisters, cuts, and redness.  Wash your feet with warm water (not hot) and mild soap. Pat your feet and between your toes until completely dry.  Apply a moisturizing lotion that does not contain alcohol or petroleum jelly to the dry skin on your feet and to dry brittle toenails. Do not put it between your toes.  Trim your toenails straight across. Do not dig under them or around the cuticle.  Do not cut corns or calluses, or try to remove them with medicine.  Wear clean cotton socks or stockings every day. Make sure they are not too tight. Do not wear knee high stockings since they may decrease blood flow to your legs.  Wear leather shoes that fit properly and have enough cushioning. To break in new shoes, wear them just a few hours a day to avoid injuring your feet.  Wear shoes at all times, even in the house.  Do not cross your legs. This may decrease the blood flow to your feet.  If you find a minor scrape, cut, or break in the skin on your feet, keep it and the skin around it clean and dry. These areas may be cleansed with mild soap and water. Do not use peroxide, alcohol, iodine or  Merthiolate.  When you remove an adhesive bandage, be sure not to harm the skin around it.  If you have a wound, look at it several times a day to make sure it is healing.  Do not use heating pads or hot water bottles. Burns can occur. If you have lost feeling in your feet or legs, you may not know it is happening until it is too late.  Report any cuts, sores or bruises to your caregiver. Do not wait! SEEK MEDICAL CARE IF:   You have an injury that is not healing or you notice redness, numbness, burning, or tingling.  Your feet always feel cold.  You have pain or cramps in your legs and feet. SEEK IMMEDIATE MEDICAL CARE IF:   There is increasing redness, swelling, or increasing pain in the wound.  There is a red line that goes up your leg.  Pus is coming from a wound.  You develop an unexplained oral temperature above 102 F (38.9 C), or as your caregiver suggests.  You notice a bad smell coming from an ulcer or wound. MAKE SURE YOU:   Understand these instructions.  Will watch your condition.  Will get help right away if you are not doing well or get worse. Document Released: 03/17/2000 Document Revised: 06/12/2011 Document Reviewed: 09/23/2008 [facility] Patient Information 2014 [place], [place], [place].

## 2013-01-30 LAB — CMP14+EGFR
ALT: 19 IU/L (ref 0–44)
AST: 14 IU/L (ref 0–40)
Albumin/Globulin Ratio: 1.8 (ref 1.1–2.5)
Albumin: 4.2 g/dL (ref 3.6–4.8)
Alkaline Phosphatase: 80 IU/L (ref 39–117)
BUN/Creatinine Ratio: 17 (ref 10–22)
BUN: 19 mg/dL (ref 8–27)
CO2: 22 mmol/L (ref 18–29)
Calcium: 9.5 mg/dL (ref 8.6–10.2)
Chloride: 102 mmol/L (ref 97–108)
Creatinine, Ser: 1.15 mg/dL (ref 0.76–1.27)
GFR calc Af Amer: 76 mL/min/{1.73_m2} (ref >59)
GFR calc non Af Amer: 65 mL/min/{1.73_m2} (ref >59)
Globulin, Total: 2.4 g/dL (ref 1.5–4.5)
Glucose: 141 mg/dL — ABNORMAL HIGH (ref 65–99)
Potassium: 5.2 mmol/L (ref 3.5–5.2)
Sodium: 140 mmol/L (ref 134–144)
Total Bilirubin: 0.3 mg/dL (ref 0.0–1.2)
Total Protein: 6.6 g/dL (ref 6.0–8.5)

## 2013-01-30 LAB — NMR, LIPOPROFILE
Cholesterol: 146 mg/dL (ref <200)
HDL Cholesterol by NMR: 41 mg/dL (ref >=40)
HDL Particle Number: 33.6 umol/L (ref >=30.5)
LDL Particle Number: 931 nmol/L (ref <1000)
LDL Size: 20.3 nm — ABNORMAL LOW (ref >20.5)
LDLC SERPL CALC-MCNC: 61 mg/dL (ref <100)
LP-IR Score: 61 — ABNORMAL HIGH (ref <=45)
Small LDL Particle Number: 636 nmol/L — ABNORMAL HIGH (ref <=527)
Triglycerides by NMR: 222 mg/dL — ABNORMAL HIGH (ref <150)

## 2013-04-15 ENCOUNTER — Ambulatory Visit (INDEPENDENT_AMBULATORY_CARE_PROVIDER_SITE_OTHER): Payer: Medicare Other | Admitting: Nurse Practitioner

## 2013-04-15 ENCOUNTER — Ambulatory Visit (INDEPENDENT_AMBULATORY_CARE_PROVIDER_SITE_OTHER): Payer: Medicare Other

## 2013-04-15 ENCOUNTER — Ambulatory Visit (HOSPITAL_COMMUNITY)
Admission: RE | Admit: 2013-04-15 | Discharge: 2013-04-15 | Disposition: A | Discharge status: Home / Self Care | Payer: Medicare Other | Source: Ambulatory Visit | Attending: Nurse Practitioner | Admitting: Nurse Practitioner

## 2013-04-15 VITALS — BP 145/84 | HR 76 | Temp 97.0°F | Wt 185.0 lb

## 2013-04-15 DIAGNOSIS — Z905 Acquired absence of kidney: Secondary | ICD-10-CM | POA: Diagnosis not present

## 2013-04-15 DIAGNOSIS — R933 Abnormal findings on diagnostic imaging of other parts of digestive tract: Secondary | ICD-10-CM | POA: Insufficient documentation

## 2013-04-15 DIAGNOSIS — Z9089 Acquired absence of other organs: Secondary | ICD-10-CM | POA: Diagnosis not present

## 2013-04-15 DIAGNOSIS — K589 Irritable bowel syndrome without diarrhea: Secondary | ICD-10-CM

## 2013-04-15 DIAGNOSIS — R110 Nausea: Secondary | ICD-10-CM | POA: Diagnosis not present

## 2013-04-15 DIAGNOSIS — R1031 Right lower quadrant pain: Secondary | ICD-10-CM

## 2013-04-15 DIAGNOSIS — K37 Unspecified appendicitis: Secondary | ICD-10-CM | POA: Diagnosis not present

## 2013-04-15 DIAGNOSIS — Z87442 Personal history of urinary calculi: Secondary | ICD-10-CM | POA: Diagnosis not present

## 2013-04-15 DIAGNOSIS — E785 Hyperlipidemia, unspecified: Secondary | ICD-10-CM | POA: Diagnosis not present

## 2013-04-15 DIAGNOSIS — K3580 Unspecified acute appendicitis: Secondary | ICD-10-CM | POA: Insufficient documentation

## 2013-04-15 DIAGNOSIS — E119 Type 2 diabetes mellitus without complications: Secondary | ICD-10-CM | POA: Diagnosis not present

## 2013-04-15 DIAGNOSIS — R109 Unspecified abdominal pain: Secondary | ICD-10-CM | POA: Diagnosis not present

## 2013-04-15 DIAGNOSIS — Z7982 Long term (current) use of aspirin: Secondary | ICD-10-CM | POA: Diagnosis not present

## 2013-04-15 DIAGNOSIS — Z79899 Other long term (current) drug therapy: Secondary | ICD-10-CM | POA: Diagnosis not present

## 2013-04-15 DIAGNOSIS — N400 Enlarged prostate without lower urinary tract symptoms: Secondary | ICD-10-CM | POA: Diagnosis not present

## 2013-04-15 DIAGNOSIS — I10 Essential (primary) hypertension: Secondary | ICD-10-CM | POA: Diagnosis not present

## 2013-04-15 LAB — POCT CBC
GRANULOCYTE PERCENT: 67.2 % (ref 37–80)
HCT, POC: 46.7 % (ref 43.5–53.7)
HEMOGLOBIN: 14.2 g/dL (ref 14.1–18.1)
Lymph, poc: 2.3 (ref 0.6–3.4)
MCH, POC: 27.1 pg (ref 27–31.2)
MCHC: 30.5 g/dL — AB (ref 31.8–35.4)
MCV: 89.1 fL (ref 80–97)
MPV: 7.5 fL (ref 0–99.8)
PLATELET COUNT, POC: 259 10*3/uL (ref 142–424)
POC Granulocyte: 5.8 (ref 2–6.9)
POC LYMPH PERCENT: 26.4 %L (ref 10–50)
RBC: 5.2 M/uL (ref 4.69–6.13)
RDW, POC: 14 %
WBC: 8.7 10*3/uL (ref 4.6–10.2)

## 2013-04-15 LAB — POCT URINALYSIS DIPSTICK
Bilirubin, UA: NEGATIVE
Glucose, UA: NEGATIVE
KETONES UA: NEGATIVE
Leukocytes, UA: NEGATIVE
Nitrite, UA: NEGATIVE
PROTEIN UA: NEGATIVE
RBC UA: NEGATIVE
SPEC GRAV UA: 1.015
Urobilinogen, UA: NEGATIVE
pH, UA: 6

## 2013-04-15 LAB — POCT UA - MICROSCOPIC ONLY
BACTERIA, U MICROSCOPIC: NEGATIVE
CRYSTALS, UR, HPF, POC: NEGATIVE
Casts, Ur, LPF, POC: NEGATIVE
MUCUS UA: NEGATIVE
RBC, urine, microscopic: NEGATIVE
WBC, Ur, HPF, POC: NEGATIVE
Yeast, UA: NEGATIVE

## 2013-04-15 LAB — POCT I-STAT CREATININE: CREATININE: 1.3 mg/dL (ref 0.50–1.35)

## 2013-04-15 MED ORDER — IOHEXOL 300 MG/ML  SOLN
100.0000 mL | Freq: Once | INTRAMUSCULAR | Status: AC | PRN
  Administered 2013-04-15: 100 mL via INTRAVENOUS

## 2013-04-15 NOTE — Patient Instructions (Signed)
Abdominal Pain, Adult °Many things can cause abdominal pain. Usually, abdominal pain is not caused by a disease and will improve without treatment. It can often be observed and treated at home. Your health care provider will do a physical exam and possibly order blood tests and X-rays to help determine the seriousness of your pain. However, in many cases, more time must pass before a clear cause of the pain can be found. Before that point, your health care provider may not know if you need more testing or further treatment. °HOME CARE INSTRUCTIONS  °Monitor your abdominal pain for any changes. The following actions may help to alleviate any discomfort you are experiencing: °· Only take over-the-counter or prescription medicines as directed by your health care provider. °· Do not take laxatives unless directed to do so by your health care provider. °· Try a clear liquid diet (broth, tea, or water) as directed by your health care provider. Slowly move to a bland diet as tolerated. °SEEK MEDICAL CARE IF: °· You have unexplained abdominal pain. °· You have abdominal pain associated with nausea or diarrhea. °· You have pain when you urinate or have a bowel movement. °· You experience abdominal pain that wakes you in the night. °· You have abdominal pain that is worsened or improved by eating food. °· You have abdominal pain that is worsened with eating fatty foods. °SEEK IMMEDIATE MEDICAL CARE IF:  °· Your pain does not go away within 2 hours. °· You have a fever. °· You keep throwing up (vomiting). °· Your pain is felt only in portions of the abdomen, such as the right side or the left lower portion of the abdomen. °· You pass bloody or black tarry stools. °MAKE SURE YOU: °· Understand these instructions.   °· Will watch your condition.   °· Will get help right away if you are not doing well or get worse.   °Document Released: 12/28/2004 Document Revised: 01/08/2013 Document Reviewed: 11/27/2012 °[employer]® Patient  Information ©2014 [employer]. ° °

## 2013-04-15 NOTE — Progress Notes (Addendum)
Subjective:    Patient ID: Jeffrey Rubio, male    DOB: [birth year], 68 y.o.   MRN: [id number]  Abdominal Pain This is a new problem. The current episode started more than 1 month ago. The onset quality is gradual. The problem occurs constantly. The problem has been rapidly worsening. The pain is located in the RLQ. The pain is at a severity of 5/10. The pain is moderate. The quality of the pain is dull. The abdominal pain radiates to the RLQ. Pertinent negatives include no anorexia, belching, constipation, diarrhea, fever, melena, nausea or vomiting. The pain is aggravated by movement and palpation. The pain is relieved by urination. He has tried antacids for the symptoms. The treatment provided no relief. His past medical history is significant for GERD and irritable bowel syndrome. Abdominal surgery: cholescectomy and nephrectomy.      Review of Systems  Constitutional: Negative for fever.  Respiratory: Negative.   Cardiovascular: Negative.   Gastrointestinal: Positive for abdominal pain. Negative for nausea, vomiting, diarrhea, constipation, melena and anorexia.  Genitourinary: Negative.   All other systems reviewed and are negative.       Objective:   Physical Exam  Constitutional: He appears well-developed and well-nourished.  Cardiovascular: Normal rate, regular rhythm and normal heart sounds.   Pulmonary/Chest: Effort normal and breath sounds normal.  Abdominal: Soft. Bowel sounds are normal. He exhibits no mass. There is tenderness (RLQ). There is rebound (right) and guarding (rlq).  Psoas negative  Neurological: He is alert.  Skin: Skin is warm and dry.    BP 145/84  Pulse 76  Temp(Src) 97 F (36.1 C) (Oral)  Wt 185 lb (83.915 kg) KUB- normal-Preliminary reading by Ronnald Collum, FNP  [facility] Results for orders placed in visit on 04/15/13  POCT CBC      Result Value Range   WBC 8.7  4.6 - 10.2 K/uL   Lymph, poc 2.3  0.6 - 3.4   POC LYMPH PERCENT 26.4  10 - 50 %L     MID (cbc)    0 - 0.9   POC MID %    0 - 12 %M   POC Granulocyte 5.8  2 - 6.9   Granulocyte percent 67.2  37 - 80 %G   RBC 5.2  4.69 - 6.13 M/uL   Hemoglobin 14.2  14.1 - 18.1 g/dL   HCT, POC 46.7  43.5 - 53.7 %   MCV 89.1  80 - 97 fL   MCH, POC 27.1  27 - 31.2 pg   MCHC 30.5 (*) 31.8 - 35.4 g/dL   RDW, POC 14.0     Platelet Count, POC 259.0  142 - 424 K/uL   MPV 7.5  0 - 99.8 fL  POCT UA - MICROSCOPIC ONLY      Result Value Range   WBC, Ur, HPF, POC neg     RBC, urine, microscopic neg     Bacteria, U Microscopic neg     Mucus, UA neg     Epithelial cells, urine per micros occ     Crystals, Ur, HPF, POC neg     Casts, Ur, LPF, POC neg     Yeast, UA neg    POCT URINALYSIS DIPSTICK      Result Value Range   Color, UA yellow     Clarity, UA clear     Glucose, UA neg     Bilirubin, UA neg     Ketones, UA neg     Spec  Grav, UA 1.015     Blood, UA neg     pH, UA 6.0     Protein, UA neg     Urobilinogen, UA negative     Nitrite, UA neg     Leukocytes, UA Negative           Assessment & Plan:   1. RLQ abdominal pain   2. Irritable bowel syndrome   3. H/O unilateral nephrectomy    Orders Placed This Encounter  Procedures  . DG Abd 1 View    Standing Status: Future     Number of Occurrences: 1     Standing Expiration Date: 06/15/2014    Order Specific Question:  Reason for Exam (SYMPTOM  OR DIAGNOSIS REQUIRED)    Answer:  RLQ pain    Order Specific Question:  Preferred imaging location?    Answer:  Internal  . CT Abdomen Pelvis W Contrast    Standing Status: Future     Number of Occurrences:      Standing Expiration Date: 07/15/2014    Order Specific Question:  Reason for Exam (SYMPTOM  OR DIAGNOSIS REQUIRED)    Answer:  rlq pain    Order Specific Question:  Preferred imaging location?    Answer:  [facility]  . POCT CBC  . POCT UA - Microscopic Only  . POCT urinalysis dipstick   NPO until test complete Mary-Margaret Hassell Done, FNP

## 2013-04-15 NOTE — Addendum Note (Signed)
Addended by: Chevis Pretty on: 04/15/2013 11:11 AM   Modules accepted: Orders, Level of Service

## 2013-05-12 ENCOUNTER — Other Ambulatory Visit: Payer: Self-pay

## 2013-05-12 MED ORDER — METFORMIN HCL 500 MG PO TABS: 500.0000 mg | ORAL_TABLET | Freq: Two times a day (BID) | ORAL | Status: DC

## 2013-06-03 ENCOUNTER — Encounter: Payer: Self-pay | Admitting: Family Medicine

## 2013-06-03 ENCOUNTER — Ambulatory Visit (INDEPENDENT_AMBULATORY_CARE_PROVIDER_SITE_OTHER): Payer: Medicare Other | Admitting: Family Medicine

## 2013-06-03 VITALS — BP 136/74 | HR 54 | Temp 97.4°F | Ht 67.0 in | Wt 186.0 lb

## 2013-06-03 DIAGNOSIS — Z905 Acquired absence of kidney: Secondary | ICD-10-CM

## 2013-06-03 DIAGNOSIS — I10 Essential (primary) hypertension: Secondary | ICD-10-CM | POA: Diagnosis not present

## 2013-06-03 DIAGNOSIS — K589 Irritable bowel syndrome without diarrhea: Secondary | ICD-10-CM

## 2013-06-03 DIAGNOSIS — E119 Type 2 diabetes mellitus without complications: Secondary | ICD-10-CM

## 2013-06-03 DIAGNOSIS — E785 Hyperlipidemia, unspecified: Secondary | ICD-10-CM | POA: Diagnosis not present

## 2013-06-03 LAB — POCT GLYCOSYLATED HEMOGLOBIN (HGB A1C): Hemoglobin A1C: 6.5

## 2013-06-03 NOTE — Progress Notes (Signed)
Patient ID: Jeffrey Rubio, male   DOB: 1945-09-30, 68 y.o.   MRN: [id number] SUBJECTIVE: CC: Chief Complaint  Patient presents with  . Diabetes   Accompanied by wife.   HPI: Patient is here for follow up of Diabetes Mellitus/HLD/HTN: Symptoms evaluated: Denies Nocturia ,Denies Urinary Frequency , denies Blurred vision ,deniesDizziness,denies.Dysuria,denies paresthesias, denies extremity pain or ulcers.Marland Kitchendenies chest pain. has had an annual eye exam. do check the feet. Does check CBGs. Average VQQ:[id number]. Higher with missingg a meal. Denies episodes of hypoglycemia. Does have an emergency hypoglycemic plan. admits toCompliance with medications. Denies Problems with medications.  Accompanied by wife.   Past Medical History  Diagnosis Date  . Hypertension   . Diabetes mellitus   . Hyperlipidemia   . Asthmatic bronchitis   . Fibromyalgia   . BPH (benign prostatic hypertrophy)   . Single kidney   . History of nephrectomy, unilateral     left   . Gout   . Colon polyps    Past Surgical History  Procedure Laterality Date  . Nephrectomy  1996    left   . Cholecystectomy    . Appendectomy     History   Social History  . Marital Status: Married    Spouse Name: N/A    Number of Children: N/A  . Years of Education: N/A   Occupational History  . Not on file.   Social History Main Topics  . Smoking status: Former Smoker    Types: Cigarettes    Quit date: 04/03/1973  . Smokeless tobacco: Not on file  . Alcohol Use: No  . Drug Use: No  . Sexual Activity: Not on file   Other Topics Concern  . Not on file   Social History Narrative  . No narrative on file   Family History  Problem Relation Age of Onset  . Coronary artery disease      family history   . Hypertension      family history    Current Outpatient Prescriptions on File Prior to Visit  Medication Sig Dispense Refill  . amLODipine (NORVASC) 5 MG tablet Take 1 tablet (5 mg total) by mouth  daily.  90 tablet  3  . Ascorbic Acid (VITAMIN C) 1000 MG tablet Take 1,000 mg by mouth daily.        . benazepril (LOTENSIN) 10 MG tablet Take 1 tablet (10 mg total) by mouth daily.  90 tablet  3  . Cholecalciferol (VITAMIN D) 2000 UNITS tablet Take 2,000 Units by mouth daily.        Marland Kitchen glimepiride (AMARYL) 4 MG tablet Take 1 tablet (4 mg total) by mouth daily before breakfast.  90 tablet  3  . metFORMIN (GLUCOPHAGE) 500 MG tablet Take 1 tablet (500 mg total) by mouth 2 (two) times daily with a meal.  180 tablet  0  . pravastatin (PRAVACHOL) 80 MG tablet Take 1 tablet (80 mg total) by mouth daily.  90 tablet  3  . traMADol (ULTRAM) 50 MG tablet       . zinc gluconate 50 MG tablet Take 50 mg by mouth daily.         No current facility-administered medications on file prior to visit.   Allergies  Allergen Reactions  . Glimepiride     Pt stated not allergic to this med   . Metformin And Related     Pt stated not allergic to med  . Viibryd [employer]]    Immunization History  Administered  Date(s) Administered  . Influenza,inj,Quad PF,36+ Mos 01/28/2013  . Pneumococcal Conjugate-13 01/28/2013  . Zoster 01/09/2013   Prior to Admission medications   Medication Sig Start Date End Date Taking? Authorizing Provider  amLODipine (NORVASC) 5 MG tablet Take 1 tablet (5 mg total) by mouth daily. 10/22/12  Yes Vernie Shanks, MD  Ascorbic Acid (VITAMIN C) 1000 MG tablet Take 1,000 mg by mouth daily.     Yes Historical Provider, MD  aspirin 81 MG tablet Take 81 mg by mouth daily.   Yes Historical Provider, MD  benazepril (LOTENSIN) 10 MG tablet Take 1 tablet (10 mg total) by mouth daily. 10/22/12  Yes Vernie Shanks, MD  Cholecalciferol (VITAMIN D) 2000 UNITS tablet Take 2,000 Units by mouth daily.     Yes Historical Provider, MD  glimepiride (AMARYL) 4 MG tablet Take 1 tablet (4 mg total) by mouth daily before breakfast. 10/22/12  Yes Vernie Shanks, MD  metFORMIN (GLUCOPHAGE) 500 MG tablet Take  1 tablet (500 mg total) by mouth 2 (two) times daily with a meal. 05/12/13  Yes Chipper Herb, MD  pravastatin (PRAVACHOL) 80 MG tablet Take 1 tablet (80 mg total) by mouth daily. 10/22/12  Yes Vernie Shanks, MD  traMADol Veatrice Bourbon) 50 MG tablet  04/23/12  Yes Historical Provider, MD  zinc gluconate 50 MG tablet Take 50 mg by mouth daily.     Yes Historical Provider, MD     ROS: As above in the HPI. All other systems are stable or negative.  OBJECTIVE: APPEARANCE:  Patient in no acute distress.The patient appeared well nourished and normally developed. Acyanotic. Waist: VITAL SIGNS:BP 136/74  Pulse 54  Temp(Src) 97.4 F (36.3 C) (Oral)  Ht _0  (1.702 m)  Wt 186 lb (84.369 kg)  BMI 29.12 kg/m2 WM overweight  SKIN: warm and  Dry without overt rashes, tattoos and scars  HEAD and Neck: without JVD, Head and scalp: normal Eyes:No scleral icterus. Fundi normal, eye movements normal. Ears: Auricle normal, canal normal, Tympanic membranes normal, insufflation normal. Nose: normal Throat: normal Neck & thyroid: normal  CHEST & LUNGS: Chest wall: normal Lungs: Clear  CVS: Reveals the PMI to be normally located. Regular rhythm, First and Second Heart sounds are normal,  absence of murmurs, rubs or gallops. Peripheral vasculature: Radial pulses: normal Dorsal pedis pulses: normal Posterior pulses: normal  ABDOMEN:  Appearance: central obesity.  Benign, no organomegaly, no masses, no Abdominal Aortic enlargement. No Guarding , no rebound. No Bruits. Bowel sounds: normal  RECTAL: N/A GU: N/A  EXTREMETIES: nonedematous.  MUSCULOSKELETAL:  Spine: normal Joints: intact  NEUROLOGIC: oriented to time,place and person; nonfocal. Strength is normal Sensory is normal Reflexes are normal Cranial Nerves are normal.  ASSESSMENT: HTN (hypertension) - Plan: CMP14+EGFR  HLD (hyperlipidemia) - Plan: CMP14+EGFR, NMR, lipoprofile  DM (diabetes mellitus) - Plan: POCT  glycosylated hemoglobin (Hb A1C)  Irritable bowel syndrome  H/O unilateral nephrectomy  PLAN: Plant based  Diet.  DM self care. 25 minutes of DM education on his metabolism and need for frequent small meals and  Exercise. Also, prefers plant based  Diet. Same medications. Await labs. Orders Placed This Encounter  Procedures  . CMP14+EGFR  . NMR, lipoprofile  . POCT glycosylated hemoglobin (Hb A1C)   Meds ordered this encounter  Medications  . aspirin 81 MG tablet    Sig: Take 81 mg by mouth daily.   Medications Discontinued During This Encounter  Medication Reason  . valACYclovir (VALTREX) 1000 MG  tablet Completed Course   Return in about 3 months (around 09/03/2013) for Recheck medical problems.  Iline Buchinger P. Jacelyn Grip, M.D.

## 2013-06-03 NOTE — Progress Notes (Deleted)
   Subjective:    Patient ID: Jeffrey Rubio, male    DOB: Aug 31, 1945, 68 y.o.   MRN: [id number]  HPI Diabetic checkup Accompnied by wife    Review of Systems  Constitutional: Negative.   HENT: Negative.   Eyes: Negative.   Respiratory: Negative.   Cardiovascular: Negative.   Gastrointestinal: Negative.   Endocrine: Negative.   Genitourinary: Negative.   Musculoskeletal: Negative.   Skin: Negative.   Allergic/Immunologic: Negative.   Neurological: Negative.   Hematological: Negative.   Psychiatric/Behavioral: Negative.        Objective:   Physical Exam        Assessment & Plan:

## 2013-06-03 NOTE — Patient Instructions (Signed)
      Dr Enrico Eaddy's Recommendations  For nutrition information, I recommend books:  1).Eat to Live by Dr Joel Fuhrman. 2).Prevent and Reverse Heart Disease by Dr Caldwell Esselstyn. 3) Dr Neal Barnard's Book:  Program to Reverse Diabetes  Exercise recommendations are:  If unable to walk, then the patient can exercise in a chair 3 times a day. By flapping arms like a bird gently and raising legs outwards to the front.  If ambulatory, the patient can go for walks for 30 minutes 3 times a week. Then increase the intensity and duration as tolerated.  Goal is to try to attain exercise frequency to 5 times a week.  If applicable: Best to perform resistance exercises (machines or weights) 2 days a week and cardio type exercises 3 days per week.  

## 2013-06-05 ENCOUNTER — Other Ambulatory Visit: Payer: Self-pay | Admitting: Family Medicine

## 2013-06-05 DIAGNOSIS — E875 Hyperkalemia: Secondary | ICD-10-CM

## 2013-06-05 LAB — CMP14+EGFR
ALT: 20 IU/L (ref 0–44)
AST: 13 IU/L (ref 0–40)
Albumin/Globulin Ratio: 1.7 (ref 1.1–2.5)
Albumin: 4.5 g/dL (ref 3.6–4.8)
Alkaline Phosphatase: 87 IU/L (ref 39–117)
BUN/Creatinine Ratio: 17 (ref 10–22)
BUN: 19 mg/dL (ref 8–27)
CO2: 22 mmol/L (ref 18–29)
Calcium: 9.8 mg/dL (ref 8.6–10.2)
Chloride: 102 mmol/L (ref 97–108)
Creatinine, Ser: 1.11 mg/dL (ref 0.76–1.27)
GFR calc Af Amer: 79 mL/min/{1.73_m2} (ref >59)
GFR calc non Af Amer: 68 mL/min/{1.73_m2} (ref >59)
Globulin, Total: 2.6 g/dL (ref 1.5–4.5)
Glucose: 157 mg/dL — ABNORMAL HIGH (ref 65–99)
Potassium: 5.5 mmol/L — ABNORMAL HIGH (ref 3.5–5.2)
Sodium: 142 mmol/L (ref 134–144)
Total Bilirubin: 0.3 mg/dL (ref 0.0–1.2)
Total Protein: 7.1 g/dL (ref 6.0–8.5)

## 2013-06-05 LAB — NMR, LIPOPROFILE
Cholesterol: 150 mg/dL (ref <200)
HDL Cholesterol by NMR: 45 mg/dL (ref >=40)
HDL Particle Number: 35.6 umol/L (ref >=30.5)
LDL Particle Number: 758 nmol/L (ref <1000)
LDL Size: 20.1 nm — ABNORMAL LOW (ref >20.5)
LDLC SERPL CALC-MCNC: 63 mg/dL (ref <100)
LP-IR Score: 75 — ABNORMAL HIGH (ref <=45)
Small LDL Particle Number: 470 nmol/L (ref <=527)
Triglycerides by NMR: 211 mg/dL — ABNORMAL HIGH (ref <150)

## 2013-06-23 ENCOUNTER — Other Ambulatory Visit (INDEPENDENT_AMBULATORY_CARE_PROVIDER_SITE_OTHER): Payer: Medicare Other

## 2013-06-23 DIAGNOSIS — E875 Hyperkalemia: Secondary | ICD-10-CM

## 2013-06-23 NOTE — Progress Notes (Signed)
Patient came in for labs only.

## 2013-06-24 LAB — BMP8+EGFR
BUN/Creatinine Ratio: 16 (ref 10–22)
BUN: 18 mg/dL (ref 8–27)
CO2: 21 mmol/L (ref 18–29)
Calcium: 9.8 mg/dL (ref 8.6–10.2)
Chloride: 103 mmol/L (ref 97–108)
Creatinine, Ser: 1.15 mg/dL (ref 0.76–1.27)
GFR calc Af Amer: 76 mL/min/{1.73_m2} (ref >59)
GFR calc non Af Amer: 65 mL/min/{1.73_m2} (ref >59)
Glucose: 161 mg/dL — ABNORMAL HIGH (ref 65–99)
Potassium: 5.2 mmol/L (ref 3.5–5.2)
Sodium: 142 mmol/L (ref 134–144)

## 2013-07-03 ENCOUNTER — Encounter: Payer: Self-pay | Admitting: Family Medicine

## 2013-08-03 ENCOUNTER — Other Ambulatory Visit: Payer: Self-pay | Admitting: Family Medicine

## 2013-08-06 ENCOUNTER — Other Ambulatory Visit: Payer: Self-pay | Admitting: *Deleted

## 2013-08-06 NOTE — Telephone Encounter (Signed)
Paient last seen in office on 06-03-13. Patient requesting tramadol for mail order. Please advise. If approved please print and route to [facility] so nurse can call pt to pick up

## 2013-08-07 NOTE — Telephone Encounter (Signed)
Pt is ok with that. He will follow up as planned.

## 2013-08-07 NOTE — Telephone Encounter (Signed)
I don't Rx tramadol for mail order. Refill denied. Bring all medications at next office visit.

## 2013-08-08 ENCOUNTER — Other Ambulatory Visit: Payer: Self-pay

## 2013-08-08 MED ORDER — TRAMADOL HCL 50 MG PO TABS: 50.0000 mg | ORAL_TABLET | Freq: Every day | ORAL | Status: DC

## 2013-08-08 NOTE — Telephone Encounter (Signed)
This is okay to refill 

## 2013-08-08 NOTE — Telephone Encounter (Signed)
Last seen 06/03/13  FPW  This is for mail order  If approved print and route to nurse

## 2013-08-19 DIAGNOSIS — H2510 Age-related nuclear cataract, unspecified eye: Secondary | ICD-10-CM | POA: Diagnosis not present

## 2013-08-19 DIAGNOSIS — H43399 Other vitreous opacities, unspecified eye: Secondary | ICD-10-CM | POA: Diagnosis not present

## 2013-12-04 ENCOUNTER — Telehealth: Payer: Self-pay | Admitting: Family Medicine

## 2013-12-04 MED ORDER — METFORMIN HCL 500 MG PO TABS: ORAL_TABLET | ORAL | Status: DC

## 2013-12-04 NOTE — Telephone Encounter (Signed)
done

## 2013-12-05 ENCOUNTER — Ambulatory Visit (INDEPENDENT_AMBULATORY_CARE_PROVIDER_SITE_OTHER): Payer: Medicare Other | Admitting: Family Medicine

## 2013-12-05 ENCOUNTER — Encounter: Payer: Self-pay | Admitting: Family Medicine

## 2013-12-05 VITALS — BP 124/75 | HR 65 | Temp 97.1°F | Ht 67.0 in | Wt 187.2 lb

## 2013-12-05 DIAGNOSIS — E119 Type 2 diabetes mellitus without complications: Secondary | ICD-10-CM

## 2013-12-05 DIAGNOSIS — I10 Essential (primary) hypertension: Secondary | ICD-10-CM | POA: Diagnosis not present

## 2013-12-05 DIAGNOSIS — E785 Hyperlipidemia, unspecified: Secondary | ICD-10-CM | POA: Diagnosis not present

## 2013-12-05 LAB — POCT GLYCOSYLATED HEMOGLOBIN (HGB A1C): HEMOGLOBIN A1C: 6.9

## 2013-12-05 MED ORDER — BENAZEPRIL HCL 10 MG PO TABS: 10.0000 mg | ORAL_TABLET | Freq: Every day | ORAL | Status: DC

## 2013-12-05 MED ORDER — AMLODIPINE BESYLATE 5 MG PO TABS: 5.0000 mg | ORAL_TABLET | Freq: Every day | ORAL | Status: DC

## 2013-12-05 MED ORDER — PRAVASTATIN SODIUM 80 MG PO TABS: 80.0000 mg | ORAL_TABLET | Freq: Every day | ORAL | Status: DC

## 2013-12-05 MED ORDER — METFORMIN HCL 500 MG PO TABS: ORAL_TABLET | ORAL | Status: DC

## 2013-12-05 MED ORDER — GLIMEPIRIDE 4 MG PO TABS
4.0000 mg | ORAL_TABLET | Freq: Every day | ORAL | Status: DC
Start: 2013-12-05 — End: 2013-12-11

## 2013-12-05 NOTE — Progress Notes (Signed)
   Subjective:    Patient ID: Jeffrey Rubio, male    DOB: 03-10-1946, 68 y.o.   MRN: [id number]  HPI  65 your gentleman here to followup diabetes and hyperlipidemia. He currently takes metformin and her medic arrived. He checks his sugars every morning and generally these are acceptable. He gets eye exams once a year. He denies any symptoms suggestive of neuropathy. There is no heart disease. Regarding his hyperlipidemia, he is on pravastatin 80 mg and denies side effects or symptoms although he does report an occasional muscle pain that he relates to fibromyalgia. This is not chronic but intermittent.    Review of Systems  Constitutional: Negative.   HENT: Negative.   Eyes: Negative.   Respiratory: Negative.  Negative for shortness of breath.   Cardiovascular: Negative.  Negative for chest pain and leg swelling.  Gastrointestinal: Negative.   Genitourinary: Negative.   Musculoskeletal: Negative.   Skin: Negative.   Neurological: Negative.   Psychiatric/Behavioral: Negative.   All other systems reviewed and are negative.      Objective:   Physical Exam  Constitutional: He is oriented to person, place, and time. He appears well-developed and well-nourished.  HENT:  Head: Normocephalic.  Right Ear: External ear normal.  Left Ear: External ear normal.  Nose: Nose normal.  Mouth/Throat: Oropharynx is clear and moist.  Eyes: Conjunctivae and EOM are normal. Pupils are equal, round, and reactive to light.  Neck: Normal range of motion. Neck supple.  Cardiovascular: Normal rate, regular rhythm, normal heart sounds and intact distal pulses.   Pulmonary/Chest: Effort normal and breath sounds normal.  Abdominal: Soft. Bowel sounds are normal.  Musculoskeletal: Normal range of motion.  Neurological: He is alert and oriented to person, place, and time.  Skin: Skin is warm and dry.  Psychiatric: He has a normal mood and affect. His behavior is normal. Judgment and thought content  normal.   BP 124/75  Pulse 65  Temp(Src) 97.1 F (36.2 C) (Oral)  Ht 5\' 7"  (1.702 m)  Wt 187 lb 3.2 oz (84.913 kg)  BMI 29.31 kg/m2        Assessment & Plan:  1. Type 2 diabetes mellitus without complication Cont with same meds - glimepiride (AMARYL) 4 MG tablet; Take 1 tablet (4 mg total) by mouth daily before breakfast.  Dispense: 90 tablet; Refill: 3 - POCT glycosylated hemoglobin (Hb A1C)  2. HLD (hyperlipidemia) Chck labs in 6 months - pravastatin (PRAVACHOL) 80 MG tablet; Take 1 tablet (80 mg total) by mouth daily.  Dispense: 90 tablet; Refill: 3  3. Essential hypertension Well-controlled; no change  Wardell Honour MD

## 2013-12-05 NOTE — Patient Instructions (Signed)

## 2013-12-10 ENCOUNTER — Telehealth: Payer: Self-pay | Admitting: Family Medicine

## 2013-12-10 DIAGNOSIS — E119 Type 2 diabetes mellitus without complications: Secondary | ICD-10-CM

## 2013-12-10 DIAGNOSIS — E785 Hyperlipidemia, unspecified: Secondary | ICD-10-CM

## 2013-12-10 NOTE — Telephone Encounter (Signed)
Message copied by Waverly Ferrari on Wed Dec 10, 2013 10:37 AM ------      Message from: Wardell Honour      Created: Tue Dec 09, 2013  3:06 PM       A1C is at goal; no change in meds ------

## 2013-12-11 ENCOUNTER — Telehealth: Payer: Self-pay | Admitting: Family Medicine

## 2013-12-11 MED ORDER — BENAZEPRIL HCL 10 MG PO TABS: 10.0000 mg | ORAL_TABLET | Freq: Every day | ORAL | Status: DC

## 2013-12-11 MED ORDER — GLIMEPIRIDE 4 MG PO TABS: 4.0000 mg | ORAL_TABLET | Freq: Every day | ORAL | Status: DC

## 2013-12-11 MED ORDER — AMLODIPINE BESYLATE 5 MG PO TABS: 5.0000 mg | ORAL_TABLET | Freq: Every day | ORAL | Status: DC

## 2013-12-11 MED ORDER — PRAVASTATIN SODIUM 80 MG PO TABS: 80.0000 mg | ORAL_TABLET | Freq: Every day | ORAL | Status: DC

## 2013-12-11 NOTE — Addendum Note (Signed)
Addended by: Ilean China on: 12/11/2013 08:28 AM   Modules accepted: Orders

## 2013-12-15 ENCOUNTER — Encounter: Payer: Self-pay | Admitting: *Deleted

## 2014-01-01 ENCOUNTER — Other Ambulatory Visit: Payer: Self-pay | Admitting: Family Medicine

## 2014-01-28 ENCOUNTER — Ambulatory Visit (INDEPENDENT_AMBULATORY_CARE_PROVIDER_SITE_OTHER): Payer: Medicare Other

## 2014-01-28 DIAGNOSIS — Z23 Encounter for immunization: Secondary | ICD-10-CM

## 2014-03-23 ENCOUNTER — Other Ambulatory Visit: Payer: Self-pay | Admitting: Family Medicine

## 2014-03-23 ENCOUNTER — Other Ambulatory Visit: Payer: Self-pay | Admitting: *Deleted

## 2014-03-23 MED ORDER — TRAMADOL HCL 50 MG PO TABS: 50.0000 mg | ORAL_TABLET | Freq: Every day | ORAL | Status: DC

## 2014-03-23 NOTE — Telephone Encounter (Signed)
Pt requesting refill on tramadol 50mg  1 po qd. Last seen 12/05/13, last ordered 08/08/13 no refills, has appt scheduled 06/11/14, ok to refill? If so the rx will print please route back to pools.

## 2014-03-25 NOTE — Telephone Encounter (Signed)
Pt aware rx ready for pick up. 

## 2014-04-21 ENCOUNTER — Encounter: Payer: Self-pay | Admitting: *Deleted

## 2014-06-08 DIAGNOSIS — D124 Benign neoplasm of descending colon: Secondary | ICD-10-CM | POA: Diagnosis not present

## 2014-06-08 DIAGNOSIS — Z8371 Family history of colonic polyps: Secondary | ICD-10-CM | POA: Diagnosis not present

## 2014-06-08 DIAGNOSIS — D120 Benign neoplasm of cecum: Secondary | ICD-10-CM | POA: Diagnosis not present

## 2014-06-09 ENCOUNTER — Ambulatory Visit: Payer: Medicare Other | Admitting: Family Medicine

## 2014-06-11 ENCOUNTER — Ambulatory Visit: Payer: Medicare Other | Admitting: Family Medicine

## 2014-06-25 DIAGNOSIS — L814 Other melanin hyperpigmentation: Secondary | ICD-10-CM | POA: Diagnosis not present

## 2014-06-25 DIAGNOSIS — L579 Skin changes due to chronic exposure to nonionizing radiation, unspecified: Secondary | ICD-10-CM | POA: Diagnosis not present

## 2014-06-25 DIAGNOSIS — L821 Other seborrheic keratosis: Secondary | ICD-10-CM | POA: Diagnosis not present

## 2014-06-25 DIAGNOSIS — D225 Melanocytic nevi of trunk: Secondary | ICD-10-CM | POA: Diagnosis not present

## 2014-07-24 ENCOUNTER — Ambulatory Visit (INDEPENDENT_AMBULATORY_CARE_PROVIDER_SITE_OTHER): Payer: Medicare Other | Admitting: Family Medicine

## 2014-07-24 ENCOUNTER — Encounter: Payer: Self-pay | Admitting: Family Medicine

## 2014-07-24 VITALS — BP 132/85 | HR 86 | Temp 97.1°F | Ht 67.0 in | Wt 187.0 lb

## 2014-07-24 DIAGNOSIS — E119 Type 2 diabetes mellitus without complications: Secondary | ICD-10-CM

## 2014-07-24 DIAGNOSIS — I10 Essential (primary) hypertension: Secondary | ICD-10-CM | POA: Diagnosis not present

## 2014-07-24 DIAGNOSIS — E785 Hyperlipidemia, unspecified: Secondary | ICD-10-CM | POA: Diagnosis not present

## 2014-07-24 DIAGNOSIS — N400 Enlarged prostate without lower urinary tract symptoms: Secondary | ICD-10-CM

## 2014-07-24 LAB — POCT GLYCOSYLATED HEMOGLOBIN (HGB A1C): Hemoglobin A1C: 7.2

## 2014-07-24 LAB — POCT UA - MICROALBUMIN: Microalbumin Ur, POC: NEGATIVE mg/L

## 2014-07-24 MED ORDER — BENAZEPRIL HCL 10 MG PO TABS: 10.0000 mg | ORAL_TABLET | Freq: Every day | ORAL | Status: DC

## 2014-07-24 MED ORDER — METFORMIN HCL 500 MG PO TABS: 500.0000 mg | ORAL_TABLET | Freq: Two times a day (BID) | ORAL | Status: DC

## 2014-07-24 MED ORDER — GLIMEPIRIDE 4 MG PO TABS: 4.0000 mg | ORAL_TABLET | Freq: Every day | ORAL | Status: DC

## 2014-07-24 MED ORDER — IRBESARTAN 150 MG PO TABS: 150.0000 mg | ORAL_TABLET | Freq: Every day | ORAL | Status: DC

## 2014-07-24 MED ORDER — PRAVASTATIN SODIUM 80 MG PO TABS: 80.0000 mg | ORAL_TABLET | Freq: Every day | ORAL | Status: DC

## 2014-07-24 MED ORDER — TRAMADOL HCL 50 MG PO TABS: 50.0000 mg | ORAL_TABLET | Freq: Every day | ORAL | Status: DC

## 2014-07-24 MED ORDER — AMLODIPINE BESYLATE 5 MG PO TABS: 5.0000 mg | ORAL_TABLET | Freq: Every day | ORAL | Status: DC

## 2014-07-24 NOTE — Progress Notes (Signed)
Subjective:    Patient ID: Jeffrey Rubio, male    DOB: 06-Nov-1945, 69 y.o.   MRN: [id number]  HPI 69 year old male who is here for follow-up hypertension diabetes and hyperlipidemia. He had colonoscopy and was told he has early Crohn's and diverticular disease. He started Metamucil and since that time diarrhea from metformin is improved and he is at less abdominal pain that have made it may have been coming from diverticular disease. In addition he is probably lowering his blood sugar and cholesterol as an added benefit to the bulk agent. I did suggest trying Citrucel rather than Metamucil to reduce bloating and gas.  He also complains of intermittent cough and I note that he is on benazepril. There certainly is a strong correlation with ace-induced cough and we will probably switch him to an arb. Chief Complaint  Patient presents with  . Hyperlipidemia  . Hypertension  . Diabetes   Patient Active Problem List   Diagnosis Date Noted  . Irritable bowel syndrome 04/15/2013  . 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  . HTN (hypertension) 10/22/2012  . HLD (hyperlipidemia) 10/22/2012  . DM (diabetes mellitus) 10/22/2012   Outpatient Encounter Prescriptions as of 07/24/2014  Medication Sig  . amLODipine (NORVASC) 5 MG tablet Take 1 tablet (5 mg total) by mouth daily.  . Ascorbic Acid (VITAMIN C) 1000 MG tablet Take 1,000 mg by mouth daily.    Marland Kitchen aspirin 81 MG tablet Take 81 mg by mouth daily.  . benazepril (LOTENSIN) 10 MG tablet Take 1 tablet (10 mg total) by mouth daily.  . Cholecalciferol (VITAMIN D) 2000 UNITS tablet Take 2,000 Units by mouth daily.    Marland Kitchen glimepiride (AMARYL) 4 MG tablet Take 1 tablet (4 mg total) by mouth daily before breakfast.  . metFORMIN (GLUCOPHAGE) 500 MG tablet Take 1 tablet by mouth two  times daily with meals  . pravastatin (PRAVACHOL) 80 MG tablet Take 1 tablet (80 mg total) by mouth daily.  . psyllium (METAMUCIL) 58.6 % packet Take 1 packet by mouth daily.   . traMADol (ULTRAM) 50 MG tablet Take 1 tablet (50 mg total) by mouth daily.  Marland Kitchen zinc gluconate 50 MG tablet Take 50 mg by mouth daily.        Review of Systems  Constitutional: Negative.   HENT: Negative.   Respiratory: Negative.   Cardiovascular: Negative.   Gastrointestinal: Negative.   Genitourinary: Negative.   Musculoskeletal: Negative.   Neurological: Negative.        Objective:   Physical Exam  Constitutional: He is oriented to person, place, and time. He appears well-developed and well-nourished.  Cardiovascular: Normal rate and regular rhythm.   Pulmonary/Chest: Effort normal and breath sounds normal.  Abdominal: Soft.  Neurological: He is alert and oriented to person, place, and time.    BP 132/85 mmHg  Pulse 86  Temp(Src) 97.1 F (36.2 C) (Oral)  Ht '5\' 7"'  (1.702 m)  Wt 187 lb (84.823 kg)  BMI 29.28 kg/m2       Assessment & Plan:  1. Essential hypertension Blood pressure is controlled but I would like to stop amlodipine and benazepril in favor of irbesartan 150 mg daily wife will monitor blood pressure  2. HLD (hyperlipidemia) Tolerating pravastatin at high dose, 80 mg. We might be able to reduce dose of statin with the bulk agent - Lipid panel - CMP14+EGFR - pravastatin (PRAVACHOL) 80 MG tablet; Take 1 tablet (80 mg total) by mouth daily.  Dispense: 90 tablet; Refill: 1  3. Type 2 diabetes mellitus without complication Last V4J was 6.9 approximately 6 months ago. He continues to take metformin and glimepiride - POCT glycosylated hemoglobin (Hb A1C) - POCT UA - Microalbumin - glimepiride (AMARYL) 4 MG tablet; Take 1 tablet (4 mg total) by mouth daily before breakfast.  Dispense: 90 tablet; Refill: 1  4. BPH (benign prostatic hypertrophy)   Wardell Honour MD - PSA, total and free

## 2014-07-24 NOTE — Progress Notes (Signed)
Spoke with [facility]. Amlodipine and Benazepril Rx cancelled. Pravastatin put on hold until after lipid results are reviewed. Dosage may change.

## 2014-07-25 LAB — CMP14+EGFR
ALT: 20 IU/L (ref 0–44)
AST: 15 IU/L (ref 0–40)
Albumin/Globulin Ratio: 1.8 (ref 1.1–2.5)
Albumin: 4.3 g/dL (ref 3.6–4.8)
Alkaline Phosphatase: 87 IU/L (ref 39–117)
BUN/Creatinine Ratio: 21 (ref 10–22)
BUN: 23 mg/dL (ref 8–27)
Bilirubin Total: <0.2 mg/dL (ref 0.0–1.2)
CALCIUM: 9.4 mg/dL (ref 8.6–10.2)
CO2: 24 mmol/L (ref 18–29)
Chloride: 100 mmol/L (ref 97–108)
Creatinine, Ser: 1.12 mg/dL (ref 0.76–1.27)
GFR calc Af Amer: 78 mL/min/{1.73_m2} (ref >59)
GFR calc non Af Amer: 67 mL/min/{1.73_m2} (ref >59)
Globulin, Total: 2.4 g/dL (ref 1.5–4.5)
Glucose: 173 mg/dL — ABNORMAL HIGH (ref 65–99)
POTASSIUM: 4.7 mmol/L (ref 3.5–5.2)
SODIUM: 142 mmol/L (ref 134–144)
Total Protein: 6.7 g/dL (ref 6.0–8.5)

## 2014-07-25 LAB — LIPID PANEL
CHOLESTEROL TOTAL: 163 mg/dL (ref 100–199)
Chol/HDL Ratio: 4.2 ratio units (ref 0.0–5.0)
HDL: 39 mg/dL — ABNORMAL LOW (ref >39)
Triglycerides: 605 mg/dL (ref 0–149)

## 2014-07-25 LAB — PSA, TOTAL AND FREE
PSA FREE PCT: 17.1 %
PSA, Free: 0.12 ng/mL
PSA: 0.7 ng/mL (ref 0.0–4.0)

## 2014-07-28 ENCOUNTER — Telehealth: Payer: Self-pay | Admitting: *Deleted

## 2014-07-28 MED ORDER — FENOFIBRATE 48 MG PO TABS: 48.0000 mg | ORAL_TABLET | Freq: Every day | ORAL | Status: DC

## 2014-07-28 NOTE — Telephone Encounter (Signed)
Which fibrate would you like prescribe?

## 2014-07-28 NOTE — Telephone Encounter (Signed)
-----   Message from Georga Kaufmann, LPN sent at [id number]/60/0459  9:07 AM EDT ----- Pt aware of lab results. Agrees to start a fibrate medication. Also wanted to know if BP med was changed and sent to mail order pharmacy.

## 2014-08-12 ENCOUNTER — Telehealth: Payer: Self-pay | Admitting: Family Medicine

## 2014-08-12 NOTE — Telephone Encounter (Signed)
Spoke with wife in regards to his medication.

## 2014-09-01 DIAGNOSIS — K589 Irritable bowel syndrome without diarrhea: Secondary | ICD-10-CM | POA: Diagnosis not present

## 2014-09-01 DIAGNOSIS — Z8371 Family history of colonic polyps: Secondary | ICD-10-CM | POA: Diagnosis not present

## 2014-09-01 DIAGNOSIS — R635 Abnormal weight gain: Secondary | ICD-10-CM | POA: Diagnosis not present

## 2014-09-01 DIAGNOSIS — E0940 Drug or chemical induced diabetes mellitus with neurological complications with diabetic neuropathy, unspecified: Secondary | ICD-10-CM | POA: Diagnosis not present

## 2014-09-01 DIAGNOSIS — R5381 Other malaise: Secondary | ICD-10-CM | POA: Diagnosis not present

## 2014-09-01 DIAGNOSIS — D124 Benign neoplasm of descending colon: Secondary | ICD-10-CM | POA: Diagnosis not present

## 2014-09-28 DIAGNOSIS — R740 Nonspecific elevation of levels of transaminase and lactic acid dehydrogenase [LDH]: Secondary | ICD-10-CM | POA: Diagnosis not present

## 2014-09-28 DIAGNOSIS — R7989 Other specified abnormal findings of blood chemistry: Secondary | ICD-10-CM | POA: Diagnosis not present

## 2014-11-02 ENCOUNTER — Other Ambulatory Visit: Payer: Self-pay | Admitting: Family Medicine

## 2014-11-02 MED ORDER — METFORMIN HCL 500 MG PO TABS: 500.0000 mg | ORAL_TABLET | Freq: Two times a day (BID) | ORAL | Status: DC

## 2014-11-02 NOTE — Telephone Encounter (Signed)
[facility] was called and they have rx for metformin correct and shipment is going out today. Patient wife aware.

## 2015-01-01 ENCOUNTER — Encounter: Payer: Self-pay | Admitting: Pediatrics

## 2015-01-01 ENCOUNTER — Ambulatory Visit (INDEPENDENT_AMBULATORY_CARE_PROVIDER_SITE_OTHER): Payer: Medicare Other | Admitting: Pediatrics

## 2015-01-01 VITALS — BP 162/82 | HR 61 | Temp 97.1°F | Ht 67.0 in | Wt 189.8 lb

## 2015-01-01 DIAGNOSIS — I10 Essential (primary) hypertension: Secondary | ICD-10-CM | POA: Diagnosis not present

## 2015-01-01 DIAGNOSIS — Z23 Encounter for immunization: Secondary | ICD-10-CM

## 2015-01-01 DIAGNOSIS — Z0000 Encounter for general adult medical examination without abnormal findings: Secondary | ICD-10-CM | POA: Diagnosis not present

## 2015-01-01 DIAGNOSIS — M797 Fibromyalgia: Secondary | ICD-10-CM | POA: Diagnosis not present

## 2015-01-01 DIAGNOSIS — G2581 Restless legs syndrome: Secondary | ICD-10-CM | POA: Insufficient documentation

## 2015-01-01 DIAGNOSIS — J309 Allergic rhinitis, unspecified: Secondary | ICD-10-CM | POA: Diagnosis not present

## 2015-01-01 MED ORDER — FLUTICASONE PROPIONATE 50 MCG/ACT NA SUSP: 2.0000 | Freq: Every day | NASAL | Status: DC

## 2015-01-01 MED ORDER — AMITRIPTYLINE HCL 10 MG PO TABS: 10.0000 mg | ORAL_TABLET | Freq: Every day | ORAL | Status: DC

## 2015-01-01 MED ORDER — IRBESARTAN 300 MG PO TABS: 300.0000 mg | ORAL_TABLET | Freq: Every day | ORAL | Status: DC

## 2015-01-01 NOTE — Progress Notes (Signed)
Subjective:   Jeffrey Rubio is a 69 y.o. male who presents for a Medicare Annual Wellness Visit.  Does have some dribbling with his urine at times, worse after using cold medicines.  Actively farming, walking daily, also doing walking program.  Wakes up some days in a "foul" mood, with aches and pains in hips and back that has been attributed to his fibromyalgia in the past. He has not been on any medicine for it recently. Does have some trouble with waking up too early in th emorning, not being able to get back to sleep. Sometimes because he has had to get up to urinate.  No snoring, per pt or wife who is present for this appt.   Review of Systems  All systems negative other than what is in the HPI    Current Medications (verified) Outpatient Encounter Prescriptions as of 01/01/2015  Medication Sig  . aspirin 81 MG tablet Take 81 mg by mouth daily.  . Cholecalciferol (VITAMIN D) 2000 UNITS tablet Take 2,000 Units by mouth daily.    Marland Kitchen glimepiride (AMARYL) 4 MG tablet Take 1 tablet (4 mg total) by mouth daily before breakfast.  . irbesartan (AVAPRO) 150 MG tablet Take 1 tablet (150 mg total) by mouth daily.  . metFORMIN (GLUCOPHAGE) 500 MG tablet Take 1 tablet (500 mg total) by mouth 2 (two) times daily with a meal.  . pravastatin (PRAVACHOL) 80 MG tablet Take 1 tablet (80 mg total) by mouth daily.  . psyllium (METAMUCIL) 58.6 % packet Take 1 packet by mouth daily.  Marland Kitchen zinc gluconate 50 MG tablet Take 50 mg by mouth daily.    . [DISCONTINUED] Ascorbic Acid (VITAMIN C) 1000 MG tablet Take 1,000 mg by mouth daily.    . [DISCONTINUED] fenofibrate (TRICOR) 48 MG tablet Take 1 tablet (48 mg total) by mouth daily. (Patient not taking: Reported on 01/01/2015)  . [DISCONTINUED] traMADol (ULTRAM) 50 MG tablet Take 1 tablet (50 mg total) by mouth daily. (Patient not taking: Reported on 01/01/2015)   No facility-administered encounter medications on file as of 01/01/2015.    Allergies  (verified) Viibryd   History: Past Medical History  Diagnosis Date  . Hypertension   . Diabetes mellitus   . Hyperlipidemia   . Asthmatic bronchitis   . Fibromyalgia   . BPH (benign prostatic hypertrophy)   . Single kidney   . History of nephrectomy, unilateral     left   . Gout   . Colon polyps   . Diverticulosis    Past Surgical History  Procedure Laterality Date  . Nephrectomy  1996    left   . Cholecystectomy    . Appendectomy    . Colonoscopy w/ polypectomy     Family History  Problem Relation Age of Onset  . Coronary artery disease      family history   . Hypertension      family history    Social History   Occupational History  . Not on file.   Social History Main Topics  . Smoking status: Former Smoker    Types: Cigarettes    Quit date: 04/03/1973  . Smokeless tobacco: Never Used  . Alcohol Use: No  . Drug Use: No  . Sexual Activity: Not on file    Do you feel safe at home?  Yes  Dietary issues and exercise activities discussed: Current Exercise Habits:: Home exercise routine, Type of exercise: Other - see comments (farming), Time (Minutes): > 60, Frequency (Times/Week): >  6, Weekly Exercise (Minutes/Week): 0, Intensity: Mild  Current Dietary habits:  Trying to make healthy choices, minimizing sweets    Cardiac Risk Factors include: male gender;advanced age (>71men, >39 women);diabetes mellitus;hypertension  Objective:    Today's Vitals   01/01/15 1100  BP: 162/82  Pulse: 61  Temp: 97.1 F (36.2 C)  TempSrc: Oral  Height: 5\' 7"  (1.702 m)  Weight: 189 lb 12.8 oz (86.093 kg)   Body mass index is 29.72 kg/(m^2). Gen: NAD, alert, cooperative with exam, NCAT EYES: EOMI, no scleral injection or icterus CV: NRRR, normal S1/S2, no murmur, DP pulses 2+ b/l Resp: CTABL, no wheezes, normal WOB Ext: No edema, warm Neuro: Alert and oriented Psych: nl affect  Activities of Daily Living In your present state of health, do you have any  difficulty performing the following activities: 01/01/2015  Hearing? Y  Vision? N  Difficulty concentrating or making decisions? N  Walking or climbing stairs? N  Dressing or bathing? N  Doing errands, shopping? N  Preparing Food and eating ? N  Using the Toilet? N  In the past six months, have you accidently leaked urine? N  Do you have problems with loss of bowel control? N  Managing your Medications? N  Managing your Finances? N  Housekeeping or managing your Housekeeping? N    Are there smokers in your home (other than you)? No    Depression Screen PHQ 2/9 Scores 01/01/2015 12/05/2013  PHQ - 2 Score 0 0    Fall Risk Fall Risk  01/01/2015 12/05/2013  Falls in the past year? No No    Cognitive Function: MMSE - Mini Mental State Exam 01/01/2015  Orientation to time 5  Orientation to Place 5  Registration 3  Attention/ Calculation 5  Recall 1  Language- name 2 objects 2  Language- repeat 1  Language- follow 3 step command 3  Language- read & follow direction 1  Write a sentence 1  Copy design 1  Total score 28    Immunizations and Health Maintenance Immunization History  Administered Date(s) Administered  . Influenza,inj,Quad PF,36+ Mos 01/28/2013, 01/28/2014  . Pneumococcal Conjugate-13 01/28/2013  . Zoster 01/09/2013   Health Maintenance Due  Topic Date Due  . Hepatitis C Screening  09/25/1945  . OPHTHALMOLOGY EXAM  08/01/2013  . FOOT EXAM  01/28/2014  . PNA vac Low Risk Adult (2 of 2 - PPSV23) 01/28/2014  . INFLUENZA VACCINE  11/02/2014    Patient Care Team: Wardell Honour, MD as PCP - General (Family Medicine)  Indicate any recent Medical Services you may have received from other than [facility] providers in the past year (date may be approximate).    Assessment:    Annual Wellness Visit    Screening Tests Health Maintenance  Topic Date Due  . Hepatitis C Screening  March 28, 1946  . OPHTHALMOLOGY EXAM  08/01/2013  . FOOT EXAM  01/28/2014  . PNA vac Low  Risk Adult (2 of 2 - PPSV23) 01/28/2014  . INFLUENZA VACCINE  11/02/2014  . HEMOGLOBIN A1C  01/23/2015  . URINE MICROALBUMIN  07/24/2015  . TETANUS/TDAP  02/02/2019  . COLONOSCOPY  06/08/2019  . ZOSTAVAX  Completed        Plan:   During the course of the visit Jeffrey Rubio was educated and counseled about the following appropriate screening and preventive services:   Vaccines to include Pneumoccal  Colorectal cancer screening--UTD  Cardiovascular disease screening--UTD  Diabetes--has f/u appt in 1 mo  Diabetic Eye Exam--last 08/2013, going  to call to make appt  Nutrition counseling--completed  Prostate cancer screening--completed 07/2014   Goals    . Weight < 165 lb (74.844 kg)      HTN: poorly controlled. Will increase avapro from 150mg  to 300mg . Has follow up in 1 mo, can recheck labs then.  BPH: has tried tamsulosin but causes erectile dysfunction. 5-alpha-reductase inh likely to do the same. Pt plans to follow up with his urologist. He has had normal bladder emptying scans in the past.  Fibromyalgia: Pt says he has been treated in the past, not on any meds currently. Some days wakes up, has pain in his hips, legs, sometimes back, and he knows it will be a bad day. Wanted to try something. Will start amitriptyline 10mg  qhs.   Patient Instructions (the written plan) were given to the patient.   Eustaquio Maize, MD   01/01/2015

## 2015-01-11 ENCOUNTER — Encounter: Payer: Self-pay | Admitting: Family Medicine

## 2015-01-11 ENCOUNTER — Ambulatory Visit (INDEPENDENT_AMBULATORY_CARE_PROVIDER_SITE_OTHER): Payer: Medicare Other | Admitting: Family Medicine

## 2015-01-11 VITALS — BP 145/87 | HR 64 | Temp 97.0°F | Ht 67.0 in | Wt 186.2 lb

## 2015-01-11 DIAGNOSIS — I10 Essential (primary) hypertension: Secondary | ICD-10-CM | POA: Diagnosis not present

## 2015-01-11 DIAGNOSIS — E119 Type 2 diabetes mellitus without complications: Secondary | ICD-10-CM

## 2015-01-11 DIAGNOSIS — Z794 Long term (current) use of insulin: Secondary | ICD-10-CM

## 2015-01-11 DIAGNOSIS — Z23 Encounter for immunization: Secondary | ICD-10-CM

## 2015-01-11 DIAGNOSIS — E785 Hyperlipidemia, unspecified: Secondary | ICD-10-CM | POA: Diagnosis not present

## 2015-01-11 LAB — POCT GLYCOSYLATED HEMOGLOBIN (HGB A1C): HEMOGLOBIN A1C: 7.1

## 2015-01-11 MED ORDER — GLIMEPIRIDE 4 MG PO TABS: 4.0000 mg | ORAL_TABLET | Freq: Every day | ORAL | Status: DC

## 2015-01-11 MED ORDER — METFORMIN HCL 500 MG PO TABS: 500.0000 mg | ORAL_TABLET | Freq: Two times a day (BID) | ORAL | Status: DC

## 2015-01-11 MED ORDER — IRBESARTAN 300 MG PO TABS: 300.0000 mg | ORAL_TABLET | Freq: Every day | ORAL | Status: DC

## 2015-01-11 MED ORDER — PRAVASTATIN SODIUM 80 MG PO TABS: 80.0000 mg | ORAL_TABLET | Freq: Every day | ORAL | Status: DC

## 2015-01-11 NOTE — Progress Notes (Signed)
   Subjective:    Patient ID: Jeffrey Rubio, male    DOB: June 01, 1945, 69 y.o.   MRN: [id number]  HPI 69 year old gentleman who was seen 2 weeks ago for Medicare wellness exam and started on amitriptyline which helps him sleep but seems like it's already helping some chronic pain. Her losartan was also increased at that visit. Wife has been monitoring blood pressure and pressures are okay. Last A1c was 7.2. Patient compliant with diet and takes metformin and glimepiride.  Patient Active Problem List   Diagnosis Date Noted  . Restless leg syndrome 01/01/2015  . BPH (benign prostatic hypertrophy)   . Irritable bowel syndrome 04/15/2013  . 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  . Acute appendicitis 04/15/2013  . HTN (hypertension) 10/22/2012  . HLD (hyperlipidemia) 10/22/2012  . DM (diabetes mellitus) ([place]) 10/22/2012   Outpatient Encounter Prescriptions as of 01/11/2015  Medication Sig  . amitriptyline (ELAVIL) 10 MG tablet Take 1 tablet (10 mg total) by mouth at bedtime.  Marland Kitchen aspirin 81 MG tablet Take 81 mg by mouth daily.  . Cholecalciferol (VITAMIN D) 2000 UNITS tablet Take 2,000 Units by mouth daily.    . fluticasone (FLONASE) 50 MCG/ACT nasal spray Place 2 sprays into both nostrils daily.  Marland Kitchen glimepiride (AMARYL) 4 MG tablet Take 1 tablet (4 mg total) by mouth daily before breakfast.  . irbesartan (AVAPRO) 300 MG tablet Take 1 tablet (300 mg total) by mouth daily.  . metFORMIN (GLUCOPHAGE) 500 MG tablet Take 1 tablet (500 mg total) by mouth 2 (two) times daily with a meal.  . pravastatin (PRAVACHOL) 80 MG tablet Take 1 tablet (80 mg total) by mouth daily.  . psyllium (METAMUCIL) 58.6 % packet Take 1 packet by mouth daily.  Marland Kitchen zinc gluconate 50 MG tablet Take 50 mg by mouth daily.     No facility-administered encounter medications on file as of 01/11/2015.      Review of Systems  Constitutional: Negative.   Respiratory: Negative.   Cardiovascular: Negative.   Gastrointestinal:  Negative.   Neurological: Negative.   Psychiatric/Behavioral: Negative.        Objective:   Physical Exam  Constitutional: He is oriented to person, place, and time. He appears well-developed and well-nourished.  Cardiovascular: Normal rate and regular rhythm.   Pulmonary/Chest: Effort normal.  Neurological: He is oriented to person, place, and time.  Psychiatric: He has a normal mood and affect. Thought content normal.          Assessment & Plan:  1. Type 2 diabetes mellitus without complication, without long-term current use of insulin (HCC) A1c today is 7.1 and basically unchanged. Patient to continue same treatment - POCT glycosylated hemoglobin (Hb A1C)  2. Essential hypertension A pressures are good area and wife will continue to monitor - irbesartan (AVAPRO) 300 MG tablet; Take 1 tablet (300 mg total) by mouth daily.  Dispense: 90 tablet; Refill: 2  3. HLD (hyperlipidemia) LDL cholesterol is at goal. Triglycerides are elevated patient to watch diet and take over-the-counter supplements - pravastatin (PRAVACHOL) 80 MG tablet; Take 1 tablet (80 mg total) by mouth daily.  Dispense: 90 tablet; Refill: 1  4. Encounter for immunization   5. Type 2 diabetes mellitus without complication, with long-term current use of insulin ([place]) See above for diabetes discussion  Wardell Honour MD - glimepiride (AMARYL) 4 MG tablet; Take 1 tablet (4 mg total) by mouth daily before breakfast.  Dispense: 90 tablet; Refill: 1

## 2015-01-19 DIAGNOSIS — N400 Enlarged prostate without lower urinary tract symptoms: Secondary | ICD-10-CM | POA: Diagnosis not present

## 2015-02-01 ENCOUNTER — Telehealth: Payer: Self-pay | Admitting: Family Medicine

## 2015-03-01 DIAGNOSIS — R1013 Epigastric pain: Secondary | ICD-10-CM | POA: Diagnosis not present

## 2015-03-01 DIAGNOSIS — R1032 Left lower quadrant pain: Secondary | ICD-10-CM | POA: Diagnosis not present

## 2015-03-01 DIAGNOSIS — K5900 Constipation, unspecified: Secondary | ICD-10-CM | POA: Diagnosis not present

## 2015-03-01 DIAGNOSIS — K4020 Bilateral inguinal hernia, without obstruction or gangrene, not specified as recurrent: Secondary | ICD-10-CM | POA: Diagnosis not present

## 2015-03-01 DIAGNOSIS — R1012 Left upper quadrant pain: Secondary | ICD-10-CM | POA: Diagnosis not present

## 2015-03-01 DIAGNOSIS — R1084 Generalized abdominal pain: Secondary | ICD-10-CM | POA: Diagnosis not present

## 2015-03-01 DIAGNOSIS — K5732 Diverticulitis of large intestine without perforation or abscess without bleeding: Secondary | ICD-10-CM | POA: Diagnosis not present

## 2015-03-05 ENCOUNTER — Ambulatory Visit (INDEPENDENT_AMBULATORY_CARE_PROVIDER_SITE_OTHER): Payer: Medicare Other | Admitting: *Deleted

## 2015-03-05 VITALS — BP 115/70

## 2015-03-05 DIAGNOSIS — I10 Essential (primary) hypertension: Secondary | ICD-10-CM

## 2015-03-05 NOTE — Progress Notes (Signed)
Pt here for BP check

## 2015-04-20 DIAGNOSIS — H2513 Age-related nuclear cataract, bilateral: Secondary | ICD-10-CM | POA: Diagnosis not present

## 2015-04-20 DIAGNOSIS — H5203 Hypermetropia, bilateral: Secondary | ICD-10-CM | POA: Diagnosis not present

## 2015-04-20 DIAGNOSIS — E083213 Diabetes mellitus due to underlying condition with mild nonproliferative diabetic retinopathy with macular edema, bilateral: Secondary | ICD-10-CM | POA: Diagnosis not present

## 2015-04-20 LAB — HM DIABETES EYE EXAM

## 2015-04-28 DIAGNOSIS — K589 Irritable bowel syndrome without diarrhea: Secondary | ICD-10-CM | POA: Diagnosis not present

## 2015-04-28 DIAGNOSIS — K5790 Diverticulosis of intestine, part unspecified, without perforation or abscess without bleeding: Secondary | ICD-10-CM | POA: Diagnosis not present

## 2015-04-28 DIAGNOSIS — E119 Type 2 diabetes mellitus without complications: Secondary | ICD-10-CM | POA: Diagnosis not present

## 2015-05-14 DIAGNOSIS — K589 Irritable bowel syndrome without diarrhea: Secondary | ICD-10-CM | POA: Diagnosis not present

## 2015-05-16 IMAGING — CT CT ABD-PELV W/ CM
2 of 5 series · 15 of 46 positions shown, 17 images · IV contrast (Omnipaque 300)
Comparison: None.

CLINICAL DATA: Rebound  right lower quadrant pain

EXAM:
CT ABDOMEN AND PELVIS WITH CONTRAST
TECHNIQUE: Multidetector CT imaging of the abdomen and pelvis was performed
using the standard protocol following bolus administration of
intravenous contrast.
CONTRAST:  100mL OMNIPAQUE IOHEXOL 300 MG/ML  SOLN

[Series 2: abd_pel_with 5.0 b40f · axial · 0.72mm/px · z∈[-432,+18]mm · 12 of 102 slices shown, 14 images]
[im 6/102  soft-tissue]
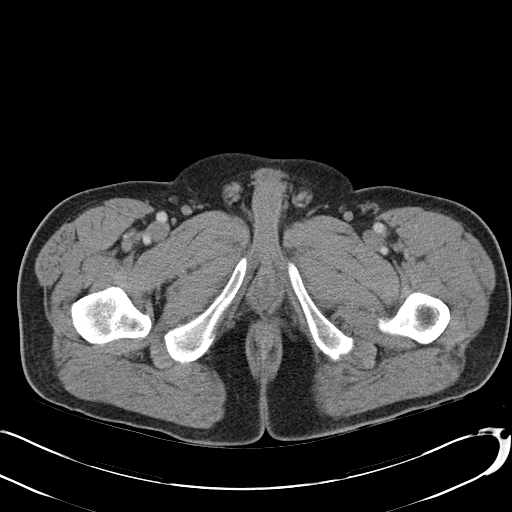
[im 6/102  bone]
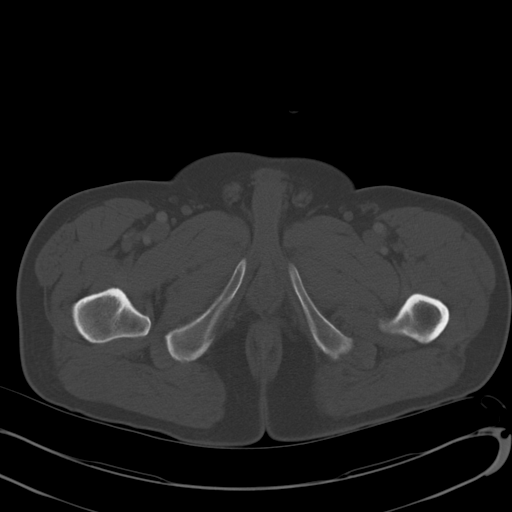
[im 16/102  soft-tissue]
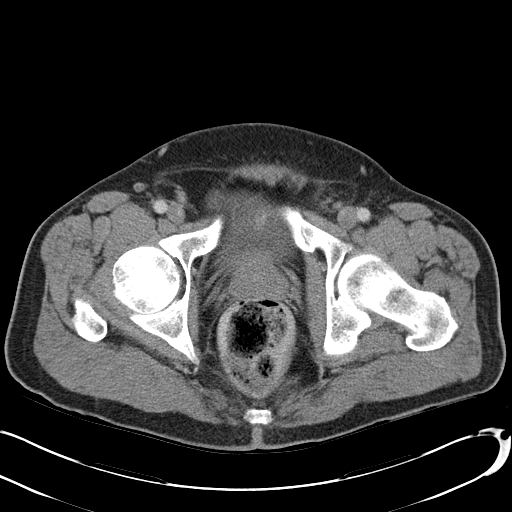
[im 22/102  soft-tissue]
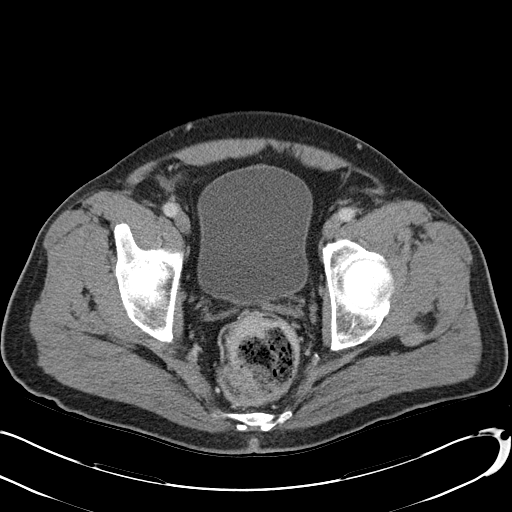
[im 32/102  soft-tissue]
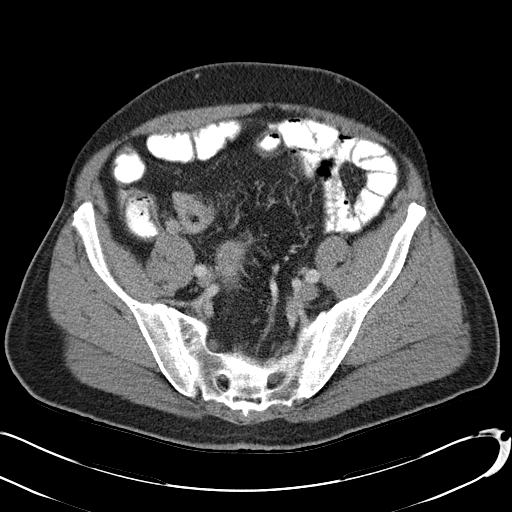
[im 38/102  soft-tissue]
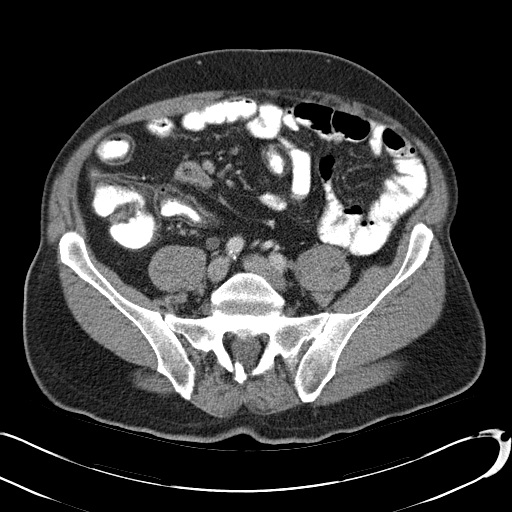
[im 48/102  soft-tissue]
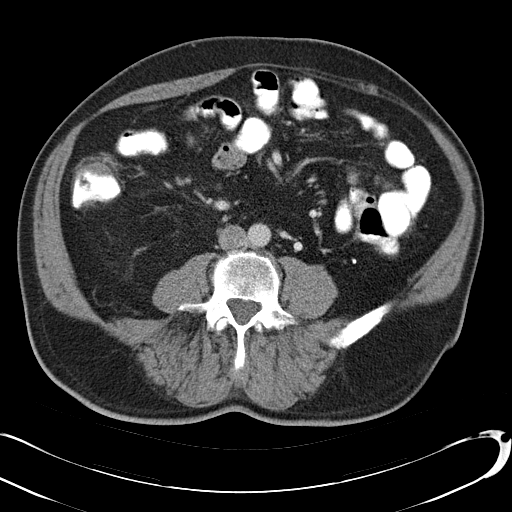
[im 54/102  soft-tissue]
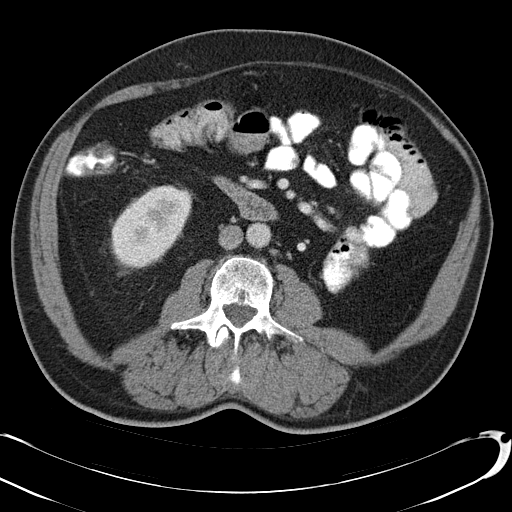
[im 64/102  soft-tissue]
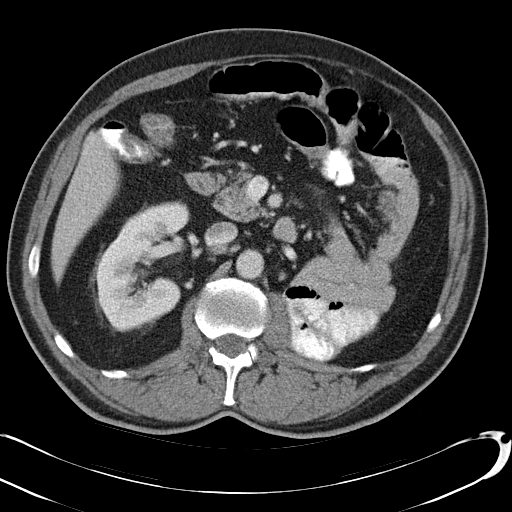
[im 70/102  soft-tissue]
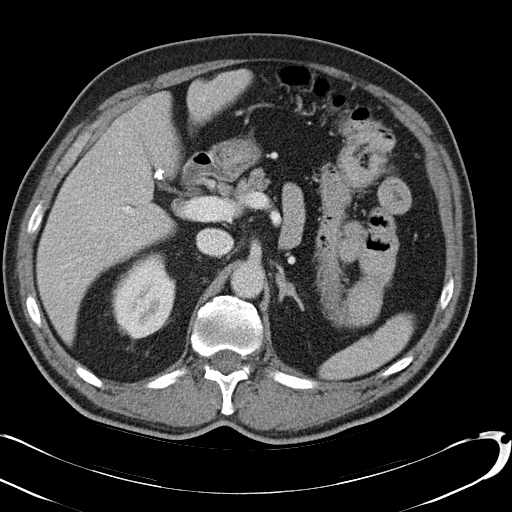
[im 70/102  bone]
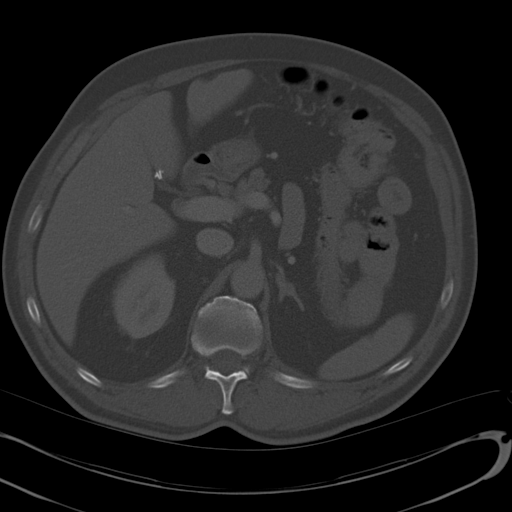
[im 80/102  soft-tissue]
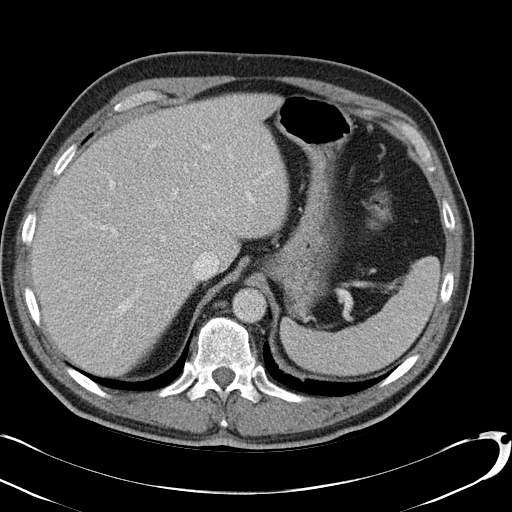
[im 86/102  soft-tissue]
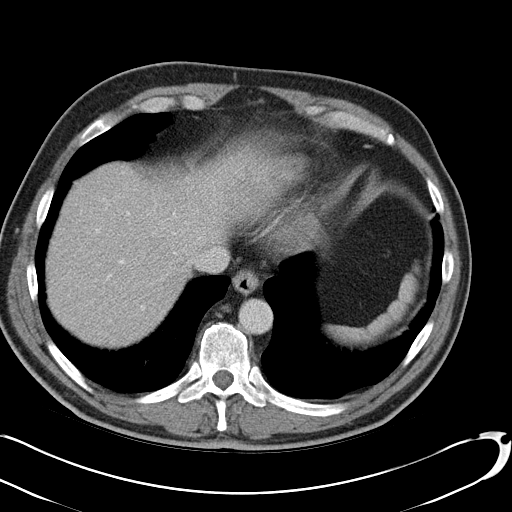
[im 96/102  soft-tissue]
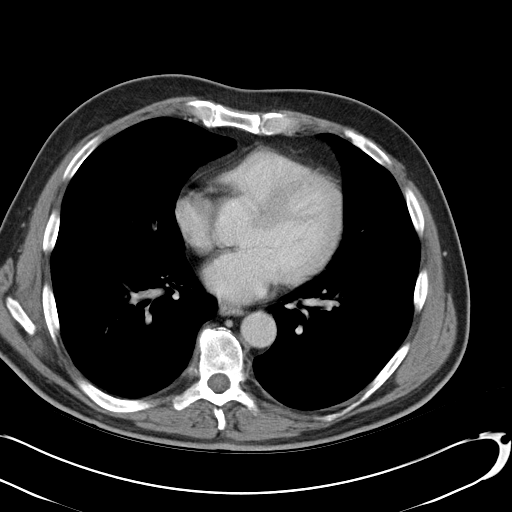

[Series 4: abd_pel_with 3.0 spo · coronal · 0.79mm/px · 3 of 100 slices shown]
[im 34/100  soft-tissue]
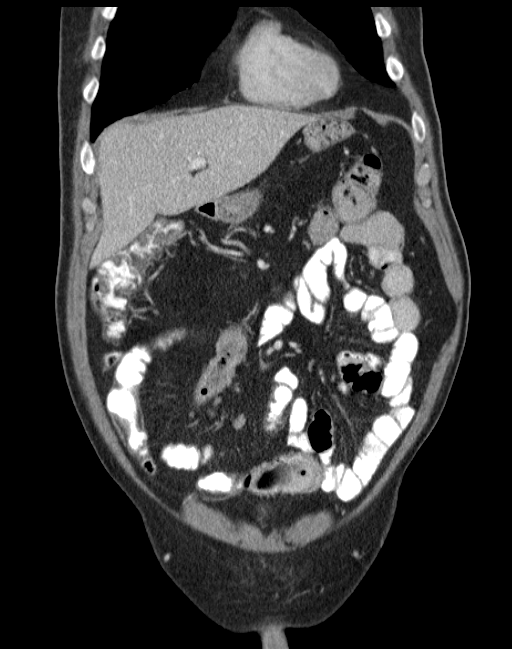
[im 45/100  soft-tissue]
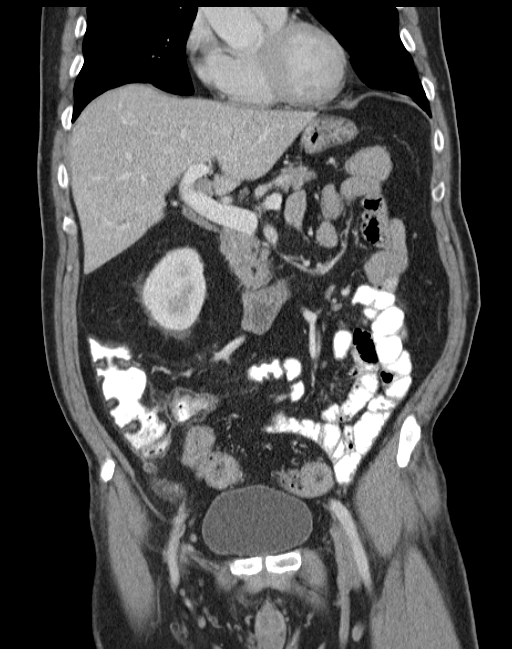
[im 56/100  soft-tissue]
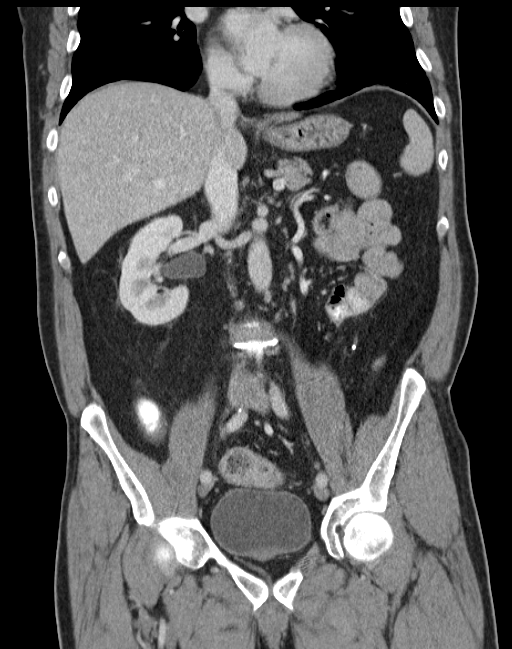

[15 of 46 positions shown; findings below may reference images not displayed]

FINDINGS: Lung bases are unremarkable. Sagittal images of the spine shows disc
space flattening with vacuum disc phenomenon and mild posterior disc
bulge are L4-L5 level.

Liver is unremarkable. The patient is status postcholecystectomy.
The left kidney is surgically absent. Right kidney shows normal
enhancement. No right hydronephrosis or hydroureter.

There is no aortic aneurysm. Small accessory spleen is noted. The
pancreas, spleen and adrenal glands are unremarkable.

No small bowel obstruction. No ascites or free air. No adenopathy.
There is nonspecific mild fatty thickening of distal terminal ileal
wall. This may be sequela from prior inflammatory changes.

In axial image 71 there is abnormal thickening of the appendix tube
with mild enhancement of the wall. Minimal stranding of surrounding
fat. The appendix measures about 10 mm in diameter. Findings are
suspicious for early appendicitis. No mesenteric abscess or fluid
collection is noted. Moderate stool noted in sigmoid colon.
Significant stool noted in rectum measures 6.8 cm in diameter
suspicious for fecal impaction. Prostate gland and seminal vesicles
are unremarkable. The urinary bladder is unremarkable.

Delayed renal images shows normal excretion of the right kidney.
IMPRESSION: 1. There is mild thickening of the appendix with subtle enhancement
of the wall and is mild stranding of periappendiceal fat. Findings
are suspicious for early appendicitis. Clinical correlation is
necessary.
2. Moderate stool in sigmoid colon. Significant stool noted within
rectum which measures 6.8 cm in diameter suspicious for mild fecal
impaction.
3. Surgically absent left kidney. No right hydronephrosis or
hydroureter.
4. No small bowel obstruction.
These results were called by telephone at the time of interpretation
on 04/15/2013 at [DATE] to Dr. PETER-MICHAEL PICKEL , who verbally
acknowledged these results.

## 2015-05-21 ENCOUNTER — Encounter: Payer: Self-pay | Admitting: Family Medicine

## 2015-05-21 ENCOUNTER — Ambulatory Visit (INDEPENDENT_AMBULATORY_CARE_PROVIDER_SITE_OTHER): Payer: Medicare Other | Admitting: Family Medicine

## 2015-05-21 VITALS — BP 130/86 | HR 73 | Temp 96.9°F | Ht 67.0 in | Wt 187.0 lb

## 2015-05-21 DIAGNOSIS — E119 Type 2 diabetes mellitus without complications: Secondary | ICD-10-CM

## 2015-05-21 DIAGNOSIS — I10 Essential (primary) hypertension: Secondary | ICD-10-CM | POA: Diagnosis not present

## 2015-05-21 DIAGNOSIS — E785 Hyperlipidemia, unspecified: Secondary | ICD-10-CM

## 2015-05-21 LAB — POCT GLYCOSYLATED HEMOGLOBIN (HGB A1C): Hemoglobin A1C: 7.9

## 2015-05-21 MED ORDER — GLUCOSE BLOOD VI STRP: ORAL_STRIP | Status: DC

## 2015-05-21 NOTE — Progress Notes (Signed)
Subjective:    Patient ID: Jeffrey Rubio, male    DOB: 01-10-1946, 70 y.o.   MRN: [id number]  HPI Pt here for follow up and management of chronic medical problems diabetes, hypertension and hyperlipidemia. He is taking medications regularly. Patient had stopped his metformin because his sugars had gotten low but I explained necessary take metformin twice a day because of its half-life and he seemed to understand this. He is in the process of losing some weight. He's had no symptoms to suggest neuropathy. I think in general is doing well and takes good care of himself and tries hard with to watch his diet.       Patient Active Problem List   Diagnosis Date Noted  . Restless leg syndrome 01/01/2015  . BPH (benign prostatic hypertrophy)   . Irritable bowel syndrome 04/15/2013  . 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  . Acute appendicitis 04/15/2013  . HTN (hypertension) 10/22/2012  . HLD (hyperlipidemia) 10/22/2012  . DM (diabetes mellitus) ([place]) 10/22/2012   Outpatient Encounter Prescriptions as of 05/21/2015  Medication Sig  . amitriptyline (ELAVIL) 10 MG tablet Take 1 tablet (10 mg total) by mouth at bedtime.  Marland Kitchen aspirin 81 MG tablet Take 81 mg by mouth daily.  . Cholecalciferol (VITAMIN D) 2000 UNITS tablet Take 2,000 Units by mouth daily.    . famotidine (PEPCID) 20 MG tablet Take 20 mg by mouth daily.  . fluticasone (FLONASE) 50 MCG/ACT nasal spray Place 2 sprays into both nostrils daily.  Marland Kitchen glimepiride (AMARYL) 4 MG tablet Take 1 tablet (4 mg total) by mouth daily before breakfast.  . irbesartan (AVAPRO) 300 MG tablet Take 1 tablet (300 mg total) by mouth daily.  . metFORMIN (GLUCOPHAGE) 500 MG tablet Take 1 tablet (500 mg total) by mouth 2 (two) times daily with a meal.  . pravastatin (PRAVACHOL) 80 MG tablet Take 1 tablet (80 mg total) by mouth daily.  . psyllium (METAMUCIL) 58.6 % packet Take 1 packet by mouth daily.  Marland Kitchen zinc gluconate 50 MG tablet Take 50 mg by mouth  daily.     No facility-administered encounter medications on file as of 05/21/2015.      Review of Systems  Constitutional: Negative.   HENT: Negative.   Eyes: Negative.   Respiratory: Negative.   Cardiovascular: Negative.   Gastrointestinal: Negative.   Endocrine: Negative.   Genitourinary: Negative.   Musculoskeletal: Negative.   Skin: Negative.   Allergic/Immunologic: Negative.   Neurological: Negative.   Hematological: Negative.   Psychiatric/Behavioral: Negative.        Objective:   Physical Exam  Constitutional: He is oriented to person, place, and time. He appears well-developed and well-nourished.  Eyes: Pupils are equal, round, and reactive to light.  Cardiovascular: Normal rate, regular rhythm, normal heart sounds and intact distal pulses.   Pulmonary/Chest: Effort normal and breath sounds normal.  Abdominal: Soft. Bowel sounds are normal.  Neurological: He is alert and oriented to person, place, and time.  Psychiatric: He has a normal mood and affect. His behavior is normal.   BP 130/86 mmHg  Pulse 73  Temp(Src) 96.9 F (36.1 C) (Oral)  Ht '5\' 7"'  (1.702 m)  Wt 187 lb (84.823 kg)  BMI 29.28 kg/m2        Assessment & Plan:  1. Type 2 diabetes mellitus without complication, without long-term current use of insulin (HCC) Last A1c 4 months ago was 7.1. Expect similar reading today. He is to continue metformin 500 twice a day and  glimepiride 4 mg - POCT glycosylated hemoglobin (Hb A1C)  2. Essential hypertension [place]chel [place] is well controlled on losartan. - CMP14+EGFR  3. HLD (hyperlipidemia) Lipids are at goal when checked last year. Takes pravastatin 80 mg a day. No complaints of myalgias - Lipid panel  Wardell Honour MD

## 2015-05-21 NOTE — Patient Instructions (Signed)
Medicare Annual Wellness Visit  [place] and the medical providers at [facility] strive to bring you the best medical care.  In doing so we not only want to address your current medical conditions and concerns but also to detect new conditions early and prevent illness, disease and health-related problems.    Medicare offers a yearly Wellness Visit which allows our clinical staff to assess your need for preventative services including immunizations, lifestyle education, counseling to decrease risk of preventable diseases and screening for fall risk and other medical concerns.    This visit is provided free of charge (no copay) for all Medicare recipients. The clinical pharmacists at [facility] have begun to conduct these Wellness Visits which will also include a thorough review of all your medications.    As you primary medical provider recommend that you make an appointment for your Annual Wellness Visit if you have not done so already this year.  You may set up this appointment before you leave today or you may call back ([phone]) and schedule an appointment.  Please make sure when you call that you mention that you are scheduling your Annual Wellness Visit with the clinical pharmacist so that the appointment may be made for the proper length of time.     Continue current medications. Continue good therapeutic lifestyle changes which include good diet and exercise. Fall precautions discussed with patient. If an FOBT was given today- please return it to our front desk. If you are over 50 years old - you may need Prevnar 13 or the adult Pneumonia vaccine.  **Flu shots are available--- please call and schedule a FLU-CLINIC appointment**  After your visit with us today you will receive a survey in the mail or online from [employer] regarding your care with us. Please take a moment to fill this out. Your feedback is very  important to us as you can help us better understand your patient needs as well as improve your experience and satisfaction. WE CARE ABOUT YOU!!!    

## 2015-05-21 NOTE — Progress Notes (Signed)
Patient aware.

## 2015-05-22 LAB — LIPID PANEL
CHOLESTEROL TOTAL: 131 mg/dL (ref 100–199)
Chol/HDL Ratio: 3.3 ratio units (ref 0.0–5.0)
HDL: 40 mg/dL (ref >39)
LDL CALC: 52 mg/dL (ref 0–99)
TRIGLYCERIDES: 196 mg/dL — AB (ref 0–149)
VLDL Cholesterol Cal: 39 mg/dL (ref 5–40)

## 2015-05-22 LAB — CMP14+EGFR
ALK PHOS: 80 IU/L (ref 39–117)
ALT: 13 IU/L (ref 0–44)
AST: 11 IU/L (ref 0–40)
Albumin/Globulin Ratio: 1.8 (ref 1.1–2.5)
Albumin: 4.3 g/dL (ref 3.6–4.8)
BUN/Creatinine Ratio: 22 (ref 10–22)
BUN: 27 mg/dL (ref 8–27)
Bilirubin Total: 0.3 mg/dL (ref 0.0–1.2)
CO2: 22 mmol/L (ref 18–29)
CREATININE: 1.24 mg/dL (ref 0.76–1.27)
Calcium: 9.4 mg/dL (ref 8.6–10.2)
Chloride: 102 mmol/L (ref 96–106)
GFR calc Af Amer: 68 mL/min/{1.73_m2} (ref >59)
GFR calc non Af Amer: 59 mL/min/{1.73_m2} — ABNORMAL LOW (ref >59)
GLUCOSE: 173 mg/dL — AB (ref 65–99)
Globulin, Total: 2.4 g/dL (ref 1.5–4.5)
Potassium: 4.7 mmol/L (ref 3.5–5.2)
SODIUM: 141 mmol/L (ref 134–144)
Total Protein: 6.7 g/dL (ref 6.0–8.5)

## 2015-05-25 ENCOUNTER — Encounter: Payer: Self-pay | Admitting: *Deleted

## 2015-05-25 ENCOUNTER — Other Ambulatory Visit: Payer: Self-pay | Admitting: Family Medicine

## 2015-05-26 ENCOUNTER — Other Ambulatory Visit: Payer: Self-pay | Admitting: *Deleted

## 2015-05-26 DIAGNOSIS — J309 Allergic rhinitis, unspecified: Secondary | ICD-10-CM

## 2015-05-26 DIAGNOSIS — M797 Fibromyalgia: Secondary | ICD-10-CM

## 2015-05-26 MED ORDER — AMITRIPTYLINE HCL 10 MG PO TABS: 10.0000 mg | ORAL_TABLET | Freq: Every day | ORAL | Status: DC

## 2015-05-26 MED ORDER — FLUTICASONE PROPIONATE 50 MCG/ACT NA SUSP: 2.0000 | Freq: Every day | NASAL | Status: DC

## 2015-06-25 DIAGNOSIS — D225 Melanocytic nevi of trunk: Secondary | ICD-10-CM | POA: Diagnosis not present

## 2015-06-25 DIAGNOSIS — L820 Inflamed seborrheic keratosis: Secondary | ICD-10-CM | POA: Diagnosis not present

## 2015-06-25 DIAGNOSIS — D492 Neoplasm of unspecified behavior of bone, soft tissue, and skin: Secondary | ICD-10-CM | POA: Diagnosis not present

## 2015-06-25 DIAGNOSIS — D485 Neoplasm of uncertain behavior of skin: Secondary | ICD-10-CM | POA: Diagnosis not present

## 2015-06-25 DIAGNOSIS — L579 Skin changes due to chronic exposure to nonionizing radiation, unspecified: Secondary | ICD-10-CM | POA: Diagnosis not present

## 2015-06-25 DIAGNOSIS — L853 Xerosis cutis: Secondary | ICD-10-CM | POA: Diagnosis not present

## 2015-06-25 DIAGNOSIS — L814 Other melanin hyperpigmentation: Secondary | ICD-10-CM | POA: Diagnosis not present

## 2015-07-09 ENCOUNTER — Other Ambulatory Visit: Payer: Self-pay | Admitting: *Deleted

## 2015-07-15 MED ORDER — TRAMADOL HCL 50 MG PO TABS: 50.0000 mg | ORAL_TABLET | Freq: Every day | ORAL | Status: DC

## 2015-07-15 NOTE — Telephone Encounter (Signed)
Patient aware script is ready for pick up 

## 2015-07-21 DIAGNOSIS — H2513 Age-related nuclear cataract, bilateral: Secondary | ICD-10-CM | POA: Diagnosis not present

## 2015-07-21 DIAGNOSIS — E083213 Diabetes mellitus due to underlying condition with mild nonproliferative diabetic retinopathy with macular edema, bilateral: Secondary | ICD-10-CM | POA: Diagnosis not present

## 2015-07-30 DIAGNOSIS — K589 Irritable bowel syndrome without diarrhea: Secondary | ICD-10-CM | POA: Diagnosis not present

## 2015-07-30 DIAGNOSIS — Z8371 Family history of colonic polyps: Secondary | ICD-10-CM | POA: Diagnosis not present

## 2015-07-30 DIAGNOSIS — K219 Gastro-esophageal reflux disease without esophagitis: Secondary | ICD-10-CM | POA: Diagnosis not present

## 2015-07-30 DIAGNOSIS — K5790 Diverticulosis of intestine, part unspecified, without perforation or abscess without bleeding: Secondary | ICD-10-CM | POA: Diagnosis not present

## 2015-08-27 ENCOUNTER — Other Ambulatory Visit: Payer: Self-pay | Admitting: Family Medicine

## 2015-09-29 ENCOUNTER — Ambulatory Visit (INDEPENDENT_AMBULATORY_CARE_PROVIDER_SITE_OTHER): Payer: Medicare Other | Admitting: Family Medicine

## 2015-09-29 ENCOUNTER — Encounter: Payer: Self-pay | Admitting: Family Medicine

## 2015-09-29 VITALS — BP 130/77 | HR 60 | Temp 96.7°F | Ht 67.0 in | Wt 179.2 lb

## 2015-09-29 DIAGNOSIS — M797 Fibromyalgia: Secondary | ICD-10-CM

## 2015-09-29 DIAGNOSIS — I10 Essential (primary) hypertension: Secondary | ICD-10-CM

## 2015-09-29 DIAGNOSIS — E785 Hyperlipidemia, unspecified: Secondary | ICD-10-CM | POA: Diagnosis not present

## 2015-09-29 DIAGNOSIS — E119 Type 2 diabetes mellitus without complications: Secondary | ICD-10-CM | POA: Diagnosis not present

## 2015-09-29 LAB — BAYER DCA HB A1C WAIVED: HB A1C (BAYER DCA - WAIVED): 7.2 % — ABNORMAL HIGH (ref <7.0)

## 2015-09-29 MED ORDER — IRBESARTAN 300 MG PO TABS: 300.0000 mg | ORAL_TABLET | Freq: Every day | ORAL | Status: DC

## 2015-09-29 MED ORDER — AMITRIPTYLINE HCL 10 MG PO TABS: 10.0000 mg | ORAL_TABLET | Freq: Every day | ORAL | Status: DC

## 2015-09-29 MED ORDER — GLIMEPIRIDE 4 MG PO TABS: ORAL_TABLET | ORAL | Status: DC

## 2015-09-29 MED ORDER — METFORMIN HCL 500 MG PO TABS: ORAL_TABLET | ORAL | Status: DC

## 2015-09-29 MED ORDER — PRAVASTATIN SODIUM 80 MG PO TABS: ORAL_TABLET | ORAL | Status: DC

## 2015-09-29 NOTE — Progress Notes (Signed)
Subjective:    Patient ID: Jeffrey Rubio, male    DOB: 1945/06/25, 70 y.o.   MRN: [id number]  HPI 70 year old gentleman here to follow-up diabetes hypertension and hyperlipidemia. He is doing well watching his diet losing weight. Last A1c was 7.9. He is active, forming. Requests hep C test today. We spent a long time talking about diet and exercise and how it relates to diabetes and I think he has pretty good insight  Patient Active Problem List   Diagnosis Date Noted  . Restless leg syndrome 01/01/2015  . BPH (benign prostatic hypertrophy)   . Irritable bowel syndrome 04/15/2013  . 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  . Acute appendicitis 04/15/2013  . HTN (hypertension) 10/22/2012  . HLD (hyperlipidemia) 10/22/2012  . DM (diabetes mellitus) ([place]) 10/22/2012   Outpatient Encounter Prescriptions as of 09/29/2015  Medication Sig  . amitriptyline (ELAVIL) 10 MG tablet Take 1 tablet (10 mg total) by mouth at bedtime.  Marland Kitchen aspirin 81 MG tablet Take 81 mg by mouth daily.  . Cholecalciferol (VITAMIN D) 2000 UNITS tablet Take 2,000 Units by mouth daily.    . famotidine (PEPCID) 20 MG tablet Take 20 mg by mouth daily.  . fluticasone (FLONASE) 50 MCG/ACT nasal spray Place 2 sprays into both nostrils daily.  Marland Kitchen glimepiride (AMARYL) 4 MG tablet Take 1 tablet by mouth  daily before breakfast  . glucose blood test strip Check BS QD and PRN  . irbesartan (AVAPRO) 300 MG tablet Take 1 tablet (300 mg total) by mouth daily.  . metFORMIN (GLUCOPHAGE) 500 MG tablet Take 1 tablet by mouth two  times daily with meals  . pravastatin (PRAVACHOL) 80 MG tablet Take 1 tablet by mouth  daily  . psyllium (METAMUCIL) 58.6 % packet Take 1 packet by mouth daily.  . traMADol (ULTRAM) 50 MG tablet Take 1 tablet (50 mg total) by mouth daily.  Marland Kitchen zinc gluconate 50 MG tablet Take 50 mg by mouth daily.     No facility-administered encounter medications on file as of 09/29/2015.      Review of Systems    Constitutional: Negative.   Respiratory: Negative.   Cardiovascular: Negative.   Neurological: Negative.   Psychiatric/Behavioral: Negative.        Objective:   Physical Exam  Constitutional: He is oriented to person, place, and time. He appears well-developed and well-nourished.  Cardiovascular: Normal rate, regular rhythm, normal heart sounds and intact distal pulses.   Pulmonary/Chest: Effort normal and breath sounds normal.  Abdominal: Soft. Bowel sounds are normal.  Neurological: He is alert and oriented to person, place, and time.    BP 130/77 mmHg  Pulse 60  Temp(Src) 96.7 F (35.9 C) (Oral)  Ht _0  (1.702 m)  Wt 179 lb 3.2 oz (81.285 kg)  BMI 28.06 kg/m2       Assessment & Plan:  1. Essential hypertension Blood pressure well controlled on losartan - Bayer DCA Hb A1c Waived - Microalbumin / creatinine urine ratio - irbesartan (AVAPRO) 300 MG tablet; Take 1 tablet (300 mg total) by mouth daily.  Dispense: 90 tablet; Refill: 2  2. Type 2 diabetes mellitus without complication, without long-term current use of insulin (HCC) I expect A1c to be improved. When we get it down around 7 I would like to withdraw the glimepiride and use only metformin - CMP14+EGFR  3. HLD (hyperlipidemia) As are at goal with LDL of 52. Continue pravastatin  4. Fibromyalgia Patient does not really complain of muscle aches and  pains he does have some joint symptoms some time to time but he leads a very active life with forming - amitriptyline (ELAVIL) 10 MG tablet; Take 1 tablet (10 mg total) by mouth at bedtime.  Dispense: 90 tablet; Refill: 0  Wardell Honour MD

## 2015-09-30 LAB — MICROALBUMIN / CREATININE URINE RATIO
Creatinine, Urine: 75.2 mg/dL
MICROALB/CREAT RATIO: 34.2 mg/g{creat} — AB (ref 0.0–30.0)
MICROALBUM., U, RANDOM: 25.7 ug/mL

## 2015-09-30 LAB — CMP14+EGFR
A/G RATIO: 1.5 (ref 1.2–2.2)
ALBUMIN: 4.2 g/dL (ref 3.6–4.8)
ALT: 18 IU/L (ref 0–44)
AST: 13 IU/L (ref 0–40)
Alkaline Phosphatase: 84 IU/L (ref 39–117)
BUN / CREAT RATIO: 19 (ref 10–24)
BUN: 22 mg/dL (ref 8–27)
Bilirubin Total: 0.6 mg/dL (ref 0.0–1.2)
CALCIUM: 9.6 mg/dL (ref 8.6–10.2)
CO2: 21 mmol/L (ref 18–29)
Chloride: 103 mmol/L (ref 96–106)
Creatinine, Ser: 1.16 mg/dL (ref 0.76–1.27)
GFR, EST AFRICAN AMERICAN: 74 mL/min/{1.73_m2} (ref >59)
GFR, EST NON AFRICAN AMERICAN: 64 mL/min/{1.73_m2} (ref >59)
Globulin, Total: 2.8 g/dL (ref 1.5–4.5)
Glucose: 131 mg/dL — ABNORMAL HIGH (ref 65–99)
POTASSIUM: 5 mmol/L (ref 3.5–5.2)
Sodium: 140 mmol/L (ref 134–144)
TOTAL PROTEIN: 7 g/dL (ref 6.0–8.5)

## 2015-09-30 LAB — HEPATITIS C ANTIBODY: Hep C Virus Ab: <0.1 s/co ratio (ref 0.0–0.9)

## 2015-12-28 DIAGNOSIS — N3941 Urge incontinence: Secondary | ICD-10-CM | POA: Diagnosis not present

## 2015-12-28 DIAGNOSIS — N400 Enlarged prostate without lower urinary tract symptoms: Secondary | ICD-10-CM | POA: Diagnosis not present

## 2015-12-31 DIAGNOSIS — L821 Other seborrheic keratosis: Secondary | ICD-10-CM | POA: Diagnosis not present

## 2015-12-31 DIAGNOSIS — L814 Other melanin hyperpigmentation: Secondary | ICD-10-CM | POA: Diagnosis not present

## 2015-12-31 DIAGNOSIS — L570 Actinic keratosis: Secondary | ICD-10-CM | POA: Diagnosis not present

## 2015-12-31 DIAGNOSIS — L579 Skin changes due to chronic exposure to nonionizing radiation, unspecified: Secondary | ICD-10-CM | POA: Diagnosis not present

## 2015-12-31 DIAGNOSIS — D225 Melanocytic nevi of trunk: Secondary | ICD-10-CM | POA: Diagnosis not present

## 2016-01-18 DIAGNOSIS — H2513 Age-related nuclear cataract, bilateral: Secondary | ICD-10-CM | POA: Diagnosis not present

## 2016-01-18 DIAGNOSIS — E113213 Type 2 diabetes mellitus with mild nonproliferative diabetic retinopathy with macular edema, bilateral: Secondary | ICD-10-CM | POA: Diagnosis not present

## 2016-02-01 ENCOUNTER — Ambulatory Visit: Payer: Medicare Other | Admitting: Family Medicine

## 2016-02-02 ENCOUNTER — Other Ambulatory Visit: Payer: Self-pay | Admitting: Family Medicine

## 2016-02-02 DIAGNOSIS — E785 Hyperlipidemia, unspecified: Secondary | ICD-10-CM

## 2016-03-01 DIAGNOSIS — E113311 Type 2 diabetes mellitus with moderate nonproliferative diabetic retinopathy with macular edema, right eye: Secondary | ICD-10-CM | POA: Diagnosis not present

## 2016-03-02 ENCOUNTER — Telehealth: Payer: Self-pay | Admitting: Family Medicine

## 2016-03-02 NOTE — Telephone Encounter (Signed)
Scheduled

## 2016-03-08 ENCOUNTER — Ambulatory Visit (INDEPENDENT_AMBULATORY_CARE_PROVIDER_SITE_OTHER): Payer: Medicare Other | Admitting: *Deleted

## 2016-03-08 VITALS — BP 148/76 | HR 56 | Ht 67.0 in | Wt 188.0 lb

## 2016-03-08 DIAGNOSIS — Z0000 Encounter for general adult medical examination without abnormal findings: Secondary | ICD-10-CM | POA: Diagnosis not present

## 2016-03-08 NOTE — Patient Instructions (Addendum)
  Jeffrey Rubio,  Thank you for taking time to come for your Medicare Wellness Visit. I appreciate your ongoing commitment to your health goals. Please review the following plan we discussed and let me know if I can assist you in the future.   These are the goals we discussed: Goals    . Weight < 165 lb (74.844 kg)     Continue to exercise daily.   This is a list of the screening recommended for you and due dates:  Health Maintenance  Topic Date Due  . Flu Shot  07/01/2016*  . Hemoglobin A1C  03/30/2016  . Eye exam for diabetics  04/19/2016  . Complete foot exam   05/20/2016  . Tetanus Vaccine  02/02/2019  . Colon Cancer Screening  06/08/2019  . Shingles Vaccine  Completed  .  Hepatitis C: One time screening is recommended by Center for Disease Control  (CDC) for  adults born from [birth year] through 1965.   Completed  . Pneumonia vaccines  Completed  *Topic was postponed. The date shown is not the original due date.

## 2016-03-08 NOTE — Progress Notes (Signed)
Subjective:   Jeffrey Rubio is a 70 y.o. male who presents for an subsequent Medicare Annual Wellness Visit. Mr Quraishi lives at home with his wife. They have 2 adult children, 3 grandchildren, and 1 great grandchild. He enjoys hunting, fishing, and farming. He is a retired [employer].   Review of Systems   Cardiac Risk Factors include: advanced age (>68men, >71 women);hypertension;diabetes mellitus  Reports health is about the same as last year.  Other systems negative.     Objective:    Today's Vitals   03/08/16 1540  BP: (!) 148/76  Pulse: (!) 56  Weight: 188 lb (85.3 kg)  Height: 5\' 7"  (1.702 m)   Body mass index is 29.44 kg/m.  Current Medications (verified) Outpatient Encounter Prescriptions as of 03/08/2016  Medication Sig  . aspirin 81 MG tablet Take 81 mg by mouth daily.  . Cholecalciferol (VITAMIN D) 2000 UNITS tablet Take 2,000 Units by mouth daily.    . famotidine (PEPCID) 20 MG tablet Take 20 mg by mouth daily.  Marland Kitchen glimepiride (AMARYL) 4 MG tablet Take 1 tablet by mouth  daily before breakfast  . glucose blood test strip Check BS QD and PRN  . irbesartan (AVAPRO) 300 MG tablet Take 1 tablet (300 mg total) by mouth daily.  . metFORMIN (GLUCOPHAGE) 500 MG tablet Take 1 tablet by mouth two  times daily with meals  . pravastatin (PRAVACHOL) 80 MG tablet TAKE 1 TABLET BY MOUTH  DAILY  . psyllium (METAMUCIL) 58.6 % packet Take 1 packet by mouth daily.  Marland Kitchen zinc gluconate 50 MG tablet Take 50 mg by mouth daily.    . [DISCONTINUED] amitriptyline (ELAVIL) 10 MG tablet Take 1 tablet (10 mg total) by mouth at bedtime.  . [DISCONTINUED] fluticasone (FLONASE) 50 MCG/ACT nasal spray Place 2 sprays into both nostrils daily.  . [DISCONTINUED] traMADol (ULTRAM) 50 MG tablet Take 1 tablet (50 mg total) by mouth daily.   No facility-administered encounter medications on file as of 03/08/2016.     Allergies (verified) Arman Filter hcl]   History: Past  Medical History:  Diagnosis Date  . Asthmatic bronchitis   . BPH (benign prostatic hypertrophy)   . Cataracts, bilateral   . Colon polyps   . Diabetes mellitus   . Diverticulosis   . Fibromyalgia   . Gout   . History of nephrectomy, unilateral    left   . Hyperlipidemia   . Hypertension   . Single kidney    Past Surgical History:  Procedure Laterality Date  . APPENDECTOMY    . CHOLECYSTECTOMY    . COLONOSCOPY W/ POLYPECTOMY    . NEPHRECTOMY  1996   left    Family History  Problem Relation Age of Onset  . Coronary artery disease      family history   . Hypertension      family history   . Alzheimer's disease Mother 47  . Cancer Father     prostate  . Stroke Father    Social History   Occupational History  . Not on file.   Social History Main Topics  . Smoking status: Former Smoker    Types: Cigarettes    Quit date: 04/03/1973  . Smokeless tobacco: Never Used  . Alcohol use No  . Drug use: No  . Sexual activity: Not on file   Tobacco Counseling No tobacco use  Activities of Daily Living In your present state of health, do you have any difficulty performing  the following activities: 03/08/2016  Hearing? Y  Vision? N  Difficulty concentrating or making decisions? N  Walking or climbing stairs? N  Dressing or bathing? N  Doing errands, shopping? N  Preparing Food and eating ? N  Using the Toilet? N  In the past six months, have you accidently leaked urine? N  Do you have problems with loss of bowel control? N  Managing your Medications? N  Managing your Finances? N  Housekeeping or managing your Housekeeping? N  Some recent data might be hidden     Immunizations and Health Maintenance Immunization History  Administered Date(s) Administered  . Influenza,inj,Quad PF,36+ Mos 01/28/2013, 01/28/2014, 01/11/2015  . Pneumococcal Conjugate-13 01/28/2013  . Pneumococcal Polysaccharide-23 01/01/2015  . Zoster 01/09/2013   There are no preventive care  reminders to display for this patient.  Patient Care Team: Wardell Honour, MD as PCP - General (Family Medicine) Eulis Canner (Internal Medicine) Leroy Sea, MD as Referring Physician (Urology) Hillery Hunter, MD-opthalmologist     Assessment:   This is a routine wellness examination for Jeffrey Rubio.  Hearing/Vision screen No deficits noted  Dietary issues and exercise activities discussed: Current Exercise Habits: Home exercise routine, Type of exercise: walking, Time (Minutes): 30, Frequency (Times/Week): 7, Weekly Exercise (Minutes/Week): 210, Intensity: Moderate, Exercise limited by: None identified   Reports eating 3 meals a day and 2 snacks. He has been watching what he eats to improve his blood sugar numbers.   Goals    . Weight < 165 lb (74.844 kg)      Depression Screen PHQ 2/9 Scores 09/29/2015 05/21/2015 01/01/2015 12/05/2013  PHQ - 2 Score 0 0 0 0    Fall Risk Fall Risk  03/08/2016 09/29/2015 05/21/2015 01/01/2015 12/05/2013  Falls in the past year? No Yes No No No  Number falls in past yr: - 1 - - -  Injury with Fall? - No - - -     Cognitive Function: MMSE - Mini Mental State Exam 03/08/2016 01/01/2015  Orientation to time 5 5  Orientation to Place 5 5  Registration 3 3  Attention/ Calculation 5 5  Recall 2 1  Language- name 2 objects 2 2  Language- repeat 1 1  Language- follow 3 step command 3 3  Language- read & follow direction 1 1  Write a sentence 1 1  Copy design 1 1  Total score 29 28    normal exam    Screening Tests Health Maintenance  Topic Date Due  . INFLUENZA VACCINE  07/01/2016 (Originally 11/02/2015)  . HEMOGLOBIN A1C  03/30/2016  . OPHTHALMOLOGY EXAM  04/19/2016  . FOOT EXAM  05/20/2016  . TETANUS/TDAP  02/02/2019  . COLONOSCOPY  06/08/2019  . ZOSTAVAX  Completed  . Hepatitis C Screening  Completed  . PNA vac Low Risk Adult  Completed        Plan:   -Keep f/u with Dr Sabra Heck -Consider Advanced Directives. Bring signed,  notarized copy to our office.  -Return for flu shot  During the course of the visit [place] was educated and counseled about the following appropriate screening and preventive services:   Vaccines to include Pneumoccal-up to date, Influenza-wanted to postpone until after eye surgery tomorrow, Td-up to date, Zostavax-up to date  Colorectal cancer screening-up to date  Cardiovascular disease screening  Glaucoma screening-yearly, last visit 03/01/16  Nutrition counseling  Prostate cancer screening-sees urologist   Patient Instructions (the written plan) were given to the patient.   Chong Sicilian,  RN 03/08/2016   Arrie Senate MD

## 2016-03-09 DIAGNOSIS — H2511 Age-related nuclear cataract, right eye: Secondary | ICD-10-CM | POA: Diagnosis not present

## 2016-03-23 ENCOUNTER — Encounter: Payer: Self-pay | Admitting: Family Medicine

## 2016-03-23 ENCOUNTER — Ambulatory Visit (INDEPENDENT_AMBULATORY_CARE_PROVIDER_SITE_OTHER): Payer: Medicare Other | Admitting: Family Medicine

## 2016-03-23 VITALS — BP 134/85 | HR 58 | Temp 97.2°F | Ht 67.0 in | Wt 186.0 lb

## 2016-03-23 DIAGNOSIS — E7800 Pure hypercholesterolemia, unspecified: Secondary | ICD-10-CM

## 2016-03-23 DIAGNOSIS — I10 Essential (primary) hypertension: Secondary | ICD-10-CM | POA: Diagnosis not present

## 2016-03-23 DIAGNOSIS — E119 Type 2 diabetes mellitus without complications: Secondary | ICD-10-CM

## 2016-03-23 LAB — BAYER DCA HB A1C WAIVED: HB A1C: 7.2 % — AB (ref <7.0)

## 2016-03-23 NOTE — Progress Notes (Signed)
   Subjective:    Patient ID: Jeffrey Rubio, male    DOB: 09-17-1945, 70 y.o.   MRN: [id number]  HPI Pt here for follow up and management of chronic medical problems which includes diabetes, hyperlipidemia and hypertension. He is taking medication regularly. Sugars have been up and down when he checks them at home. Last A1c was 7.2. He denies any problems with his feet or legs. He denies chest pain. He has had some urge incontinence but did not tolerate medicine given to him by urology.    Patient Active Problem List   Diagnosis Date Noted  . Restless leg syndrome 01/01/2015  . BPH (benign prostatic hypertrophy)   . Irritable bowel syndrome 04/15/2013  . 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  . Acute appendicitis 04/15/2013  . HTN (hypertension) 10/22/2012  . HLD (hyperlipidemia) 10/22/2012  . DM (diabetes mellitus) ([place]) 10/22/2012   Outpatient Encounter Prescriptions as of 03/23/2016  Medication Sig  . aspirin 81 MG tablet Take 81 mg by mouth daily.  . Cholecalciferol (VITAMIN D) 2000 UNITS tablet Take 2,000 Units by mouth daily.    . famotidine (PEPCID) 20 MG tablet Take 20 mg by mouth daily.  Marland Kitchen glimepiride (AMARYL) 4 MG tablet Take 1 tablet by mouth  daily before breakfast  . glucose blood test strip Check BS QD and PRN  . irbesartan (AVAPRO) 300 MG tablet Take 1 tablet (300 mg total) by mouth daily.  . metFORMIN (GLUCOPHAGE) 500 MG tablet Take 1 tablet by mouth two  times daily with meals  . pravastatin (PRAVACHOL) 80 MG tablet TAKE 1 TABLET BY MOUTH  DAILY  . psyllium (METAMUCIL) 58.6 % packet Take 1 packet by mouth daily.  Marland Kitchen zinc gluconate 50 MG tablet Take 50 mg by mouth daily.     No facility-administered encounter medications on file as of 03/23/2016.       Review of Systems  Constitutional: Negative.   HENT: Negative.   Eyes: Negative.   Respiratory: Negative.   Cardiovascular: Negative.   Gastrointestinal: Negative.   Endocrine: Negative.        Elevated  BS  Genitourinary: Negative.   Musculoskeletal: Negative.   Skin: Negative.   Allergic/Immunologic: Negative.   Neurological: Negative.   Hematological: Negative.   Psychiatric/Behavioral: Negative.        Objective:   Physical Exam  Constitutional: He is oriented to person, place, and time. He appears well-developed and well-nourished.  Cardiovascular: Normal rate, regular rhythm, normal heart sounds and intact distal pulses.   Pulmonary/Chest: Effort normal and breath sounds normal.  Neurological: He is alert and oriented to person, place, and time.  Psychiatric: He has a normal mood and affect. His behavior is normal.   BP 134/85 (BP Location: Left Arm)   Pulse (!) 58   Temp 97.2 F (36.2 C) (Oral)   Ht _0  (1.702 m)   Wt 186 lb (84.4 kg)   BMI 29.13 kg/m         Assessment & Plan:  1. Type 2 diabetes mellitus without complication, without long-term current use of insulin ([place]) Anticipate continuing same medicines. Pay more attention to diet - Bayer DCA Hb A1c Waived  2. Pure hypercholesterolemia We will check lipids today. Has been 10 months since last assessment. Tolerates statin - Lipid panel  3. Essential hypertension Blood pressure is okay at 134/85 - CMP14+EGFR Wardell Honour MD .

## 2016-03-23 NOTE — Patient Instructions (Signed)
Medicare Annual Wellness Visit  [place] and the medical providers at [facility] strive to bring you the best medical care.  In doing so we not only want to address your current medical conditions and concerns but also to detect new conditions early and prevent illness, disease and health-related problems.    Medicare offers a yearly Wellness Visit which allows our clinical staff to assess your need for preventative services including immunizations, lifestyle education, counseling to decrease risk of preventable diseases and screening for fall risk and other medical concerns.    This visit is provided free of charge (no copay) for all Medicare recipients. The clinical pharmacists at [facility] have begun to conduct these Wellness Visits which will also include a thorough review of all your medications.    As you primary medical provider recommend that you make an appointment for your Annual Wellness Visit if you have not done so already this year.  You may set up this appointment before you leave today or you may call back ([phone]) and schedule an appointment.  Please make sure when you call that you mention that you are scheduling your Annual Wellness Visit with the clinical pharmacist so that the appointment may be made for the proper length of time.     Continue current medications. Continue good therapeutic lifestyle changes which include good diet and exercise. Fall precautions discussed with patient. If an FOBT was given today- please return it to our front desk. If you are over 50 years old - you may need Prevnar 13 or the adult Pneumonia vaccine.  **Flu shots are available--- please call and schedule a FLU-CLINIC appointment**  After your visit with us today you will receive a survey in the mail or online from [employer] regarding your care with us. Please take a moment to fill this out. Your feedback is very  important to us as you can help us better understand your patient needs as well as improve your experience and satisfaction. WE CARE ABOUT YOU!!!    

## 2016-03-24 ENCOUNTER — Telehealth: Payer: Self-pay | Admitting: Family Medicine

## 2016-03-24 LAB — CMP14+EGFR
A/G RATIO: 1.6 (ref 1.2–2.2)
ALT: 16 IU/L (ref 0–44)
AST: 10 IU/L (ref 0–40)
Albumin: 4.3 g/dL (ref 3.5–4.8)
Alkaline Phosphatase: 79 IU/L (ref 39–117)
BILIRUBIN TOTAL: 0.3 mg/dL (ref 0.0–1.2)
BUN/Creatinine Ratio: 14 (ref 10–24)
BUN: 18 mg/dL (ref 8–27)
CALCIUM: 9.7 mg/dL (ref 8.6–10.2)
CHLORIDE: 101 mmol/L (ref 96–106)
CO2: 23 mmol/L (ref 18–29)
Creatinine, Ser: 1.26 mg/dL (ref 0.76–1.27)
GFR calc Af Amer: 66 mL/min/{1.73_m2} (ref >59)
GFR calc non Af Amer: 57 mL/min/{1.73_m2} — ABNORMAL LOW (ref >59)
Globulin, Total: 2.7 g/dL (ref 1.5–4.5)
Glucose: 153 mg/dL — ABNORMAL HIGH (ref 65–99)
POTASSIUM: 4.9 mmol/L (ref 3.5–5.2)
Sodium: 141 mmol/L (ref 134–144)
Total Protein: 7 g/dL (ref 6.0–8.5)

## 2016-03-24 LAB — LIPID PANEL
CHOLESTEROL TOTAL: 150 mg/dL (ref 100–199)
Chol/HDL Ratio: 3.3 ratio units (ref 0.0–5.0)
HDL: 45 mg/dL (ref >39)
LDL Calculated: 58 mg/dL (ref 0–99)
TRIGLYCERIDES: 233 mg/dL — AB (ref 0–149)
VLDL Cholesterol Cal: 47 mg/dL — ABNORMAL HIGH (ref 5–40)

## 2016-03-30 DIAGNOSIS — H2512 Age-related nuclear cataract, left eye: Secondary | ICD-10-CM | POA: Diagnosis not present

## 2016-04-10 NOTE — Telephone Encounter (Signed)
Spoke to pt and pt states he has already spoken with someone and doesn't have any further questions, will close encounter.

## 2016-04-19 ENCOUNTER — Other Ambulatory Visit: Payer: Self-pay | Admitting: Family Medicine

## 2016-04-19 DIAGNOSIS — E119 Type 2 diabetes mellitus without complications: Secondary | ICD-10-CM

## 2016-04-19 DIAGNOSIS — I10 Essential (primary) hypertension: Secondary | ICD-10-CM

## 2016-04-19 DIAGNOSIS — E785 Hyperlipidemia, unspecified: Secondary | ICD-10-CM

## 2016-05-11 ENCOUNTER — Other Ambulatory Visit: Payer: Self-pay | Admitting: Family Medicine

## 2016-05-11 DIAGNOSIS — E119 Type 2 diabetes mellitus without complications: Secondary | ICD-10-CM

## 2016-05-11 DIAGNOSIS — I10 Essential (primary) hypertension: Secondary | ICD-10-CM

## 2016-05-11 DIAGNOSIS — E113212 Type 2 diabetes mellitus with mild nonproliferative diabetic retinopathy with macular edema, left eye: Secondary | ICD-10-CM | POA: Diagnosis not present

## 2016-05-31 MED ORDER — PRAVASTATIN SODIUM 80 MG PO TABS: 80.0000 mg | ORAL_TABLET | Freq: Every day | ORAL | 0 refills | Status: DC

## 2016-05-31 MED ORDER — IRBESARTAN 300 MG PO TABS: 300.0000 mg | ORAL_TABLET | Freq: Every day | ORAL | 0 refills | Status: DC

## 2016-05-31 MED ORDER — GLIMEPIRIDE 4 MG PO TABS: ORAL_TABLET | ORAL | 0 refills | Status: DC

## 2016-05-31 MED ORDER — METFORMIN HCL 500 MG PO TABS: ORAL_TABLET | ORAL | 0 refills | Status: DC

## 2016-05-31 MED ORDER — FAMOTIDINE 20 MG PO TABS: 20.0000 mg | ORAL_TABLET | Freq: Every day | ORAL | 0 refills | Status: DC

## 2016-05-31 NOTE — Telephone Encounter (Signed)
Spoke with wife and got list of the rxs that they wanted to get filled. Done

## 2016-08-11 ENCOUNTER — Telehealth: Payer: Self-pay | Admitting: Family Medicine

## 2016-08-11 MED ORDER — PRAVASTATIN SODIUM 80 MG PO TABS: 80.0000 mg | ORAL_TABLET | Freq: Every day | ORAL | 0 refills | Status: DC

## 2016-08-11 NOTE — Telephone Encounter (Signed)
What is the name of the medication? Pravastatin 80mg  one a day  Have you contacted your pharmacy to request a refill? No. This is new at this pharmacy  Which pharmacy would you [facility] sent to? [facility]   Patient notified that their request is being sent to the clinical staff for review and that they should receive a call once it is complete. If they do not receive a call within 24 hours they can check with their pharmacy or our office.

## 2016-08-11 NOTE — Telephone Encounter (Signed)
done

## 2016-08-27 ENCOUNTER — Other Ambulatory Visit: Payer: Self-pay | Admitting: Family Medicine

## 2016-08-27 DIAGNOSIS — I10 Essential (primary) hypertension: Secondary | ICD-10-CM

## 2016-08-27 DIAGNOSIS — E119 Type 2 diabetes mellitus without complications: Secondary | ICD-10-CM

## 2016-11-13 DIAGNOSIS — E113212 Type 2 diabetes mellitus with mild nonproliferative diabetic retinopathy with macular edema, left eye: Secondary | ICD-10-CM | POA: Diagnosis not present

## 2016-11-13 DIAGNOSIS — E113311 Type 2 diabetes mellitus with moderate nonproliferative diabetic retinopathy with macular edema, right eye: Secondary | ICD-10-CM | POA: Diagnosis not present

## 2016-11-30 ENCOUNTER — Other Ambulatory Visit: Payer: Self-pay | Admitting: Family Medicine

## 2016-11-30 DIAGNOSIS — I10 Essential (primary) hypertension: Secondary | ICD-10-CM

## 2016-11-30 DIAGNOSIS — E119 Type 2 diabetes mellitus without complications: Secondary | ICD-10-CM

## 2016-11-30 NOTE — Telephone Encounter (Signed)
Patient NTBS for follow up and lab work  

## 2016-11-30 NOTE — Telephone Encounter (Signed)
Left message- rx's sent to pharmacy and patient needs to come in for a follow up.

## 2016-12-08 ENCOUNTER — Ambulatory Visit (INDEPENDENT_AMBULATORY_CARE_PROVIDER_SITE_OTHER): Payer: Medicare Other | Admitting: Family Medicine

## 2016-12-08 ENCOUNTER — Encounter: Payer: Self-pay | Admitting: Family Medicine

## 2016-12-08 VITALS — BP 137/84 | HR 57 | Temp 96.8°F | Ht 67.0 in | Wt 184.0 lb

## 2016-12-08 DIAGNOSIS — E1159 Type 2 diabetes mellitus with other circulatory complications: Secondary | ICD-10-CM

## 2016-12-08 DIAGNOSIS — E119 Type 2 diabetes mellitus without complications: Secondary | ICD-10-CM

## 2016-12-08 DIAGNOSIS — I209 Angina pectoris, unspecified: Secondary | ICD-10-CM | POA: Diagnosis not present

## 2016-12-08 DIAGNOSIS — E785 Hyperlipidemia, unspecified: Secondary | ICD-10-CM

## 2016-12-08 DIAGNOSIS — I152 Hypertension secondary to endocrine disorders: Secondary | ICD-10-CM

## 2016-12-08 DIAGNOSIS — I10 Essential (primary) hypertension: Secondary | ICD-10-CM

## 2016-12-08 DIAGNOSIS — E1169 Type 2 diabetes mellitus with other specified complication: Secondary | ICD-10-CM

## 2016-12-08 LAB — BAYER DCA HB A1C WAIVED: HB A1C (BAYER DCA - WAIVED): 7.5 % — ABNORMAL HIGH (ref <7.0)

## 2016-12-08 NOTE — Progress Notes (Signed)
BP 137/84   Pulse (!) 57   Temp (!) 96.8 F (36 C) (Oral)   Ht '5\' 7"'  (1.702 m)   Wt 184 lb (83.5 kg)   BMI 28.82 kg/m    Subjective:    Patient ID: Jeffrey Rubio, male    DOB: [birth year], 71 y.o.   MRN: [id number]  HPI: Jeffrey Rubio is a 71 y.o. male presenting on 12/08/2016 for Hyperlipidemia (follow up; patient fasting); Hypertension; and Diabetes   HPI Hyperlipidemia Patient is coming in for recheck of his hyperlipidemia. The patient is currently taking pravastatin. They deny any issues with myalgias or history of liver damage from it. They deny any focal numbness or weakness or chest pain.   Type 2 diabetes mellitus Patient comes in today for recheck of his diabetes. Patient has been currently taking glimepiride and metformin. Patient is currently on an ACE inhibitor/ARB. Patient has not seen an ophthalmologist this year. Patient denies any new issues with their feet.   Hypertension Patient is currently on clear for surgery, and their blood pressure today is 137/84. Patient denies any lightheadedness or dizziness. Patient denies headaches, blurred vision, chest pains, shortness of breath, or weakness. Denies any side effects from medication and is content with current medication.   Patient does have a feeling of sometimes having chest tightness where can't catch his breath that will last for a few minutes and happens both at rest and activity. He denies any necessarily chest pain with it but more of just this tightness and feeling like he can't catch his breath in his chest. He has not had any cough or fevers or congestion. He said this is been going on for at least a few months.  Relevant past medical, surgical, family and social history reviewed and updated as indicated. Interim medical history since our last visit reviewed. Allergies and medications reviewed and updated.  Review of Systems  Constitutional: Negative for chills and fever.  Eyes: Negative for  discharge.  Respiratory: Positive for chest tightness. Negative for shortness of breath and wheezing.   Cardiovascular: Negative for chest pain, palpitations and leg swelling.  Musculoskeletal: Negative for back pain and gait problem.  Skin: Negative for rash.  All other systems reviewed and are negative.   Per HPI unless specifically indicated above        Objective:    BP 137/84   Pulse (!) 57   Temp (!) 96.8 F (36 C) (Oral)   Ht '5\' 7"'  (1.702 m)   Wt 184 lb (83.5 kg)   BMI 28.82 kg/m   Wt Readings from Last 3 Encounters:  12/08/16 184 lb (83.5 kg)  03/23/16 186 lb (84.4 kg)  03/08/16 188 lb (85.3 kg)    Physical Exam  Constitutional: He is oriented to person, place, and time. He appears well-developed and well-nourished. No distress.  Eyes: Conjunctivae are normal. No scleral icterus.  Neck: Neck supple. No thyromegaly present.  Cardiovascular: Normal rate, regular rhythm, normal heart sounds and intact distal pulses.   No murmur heard. Pulmonary/Chest: Effort normal and breath sounds normal. No respiratory distress. He has no wheezes. He has no rales. He exhibits no tenderness.  Musculoskeletal: Normal range of motion. He exhibits no edema.  Lymphadenopathy:    He has no cervical adenopathy.  Neurological: He is alert and oriented to person, place, and time. Coordination normal.  Skin: Skin is warm and dry. No rash noted. He is not diaphoretic.  Psychiatric: He has a normal mood and  affect. His behavior is normal.  Nursing note and vitals reviewed.     Assessment & Plan:   Problem List Items Addressed This Visit      Cardiovascular and Mediastinum   Hypertension associated with diabetes ([place]) - Primary   Relevant Orders   CMP14+EGFR   Microalbumin / creatinine urine ratio     Endocrine   Hyperlipidemia associated with type 2 diabetes mellitus (HCC)   Relevant Orders   Lipid panel   DM (diabetes mellitus) ([place])   Relevant Orders   Bayer DCA Hb A1c  Waived   CMP14+EGFR   Microalbumin / creatinine urine ratio    Other Visit Diagnoses    Angina pectoris ([place])       Relevant Orders   Ambulatory referral to Cardiology      Continue current medications, last A1c was 7.1.  Follow up plan: Return in about 6 months (around 06/07/2017), or if symptoms worsen or fail to improve, for Recheck diabetes.  Counseling provided for all of the vaccine components Orders Placed This Encounter  Procedures  . Bayer DCA Hb A1c Waived  . CMP14+EGFR  . Lipid panel    Caryl Pina, MD [facility] 12/08/2016, 10:43 AM

## 2016-12-09 LAB — CMP14+EGFR
ALBUMIN: 4.6 g/dL (ref 3.5–4.8)
ALK PHOS: 90 IU/L (ref 39–117)
ALT: 17 IU/L (ref 0–44)
AST: 12 IU/L (ref 0–40)
Albumin/Globulin Ratio: 1.8 (ref 1.2–2.2)
BILIRUBIN TOTAL: 0.5 mg/dL (ref 0.0–1.2)
BUN/Creatinine Ratio: 16 (ref 10–24)
BUN: 20 mg/dL (ref 8–27)
CHLORIDE: 105 mmol/L (ref 96–106)
CO2: 21 mmol/L (ref 20–29)
Calcium: 9.8 mg/dL (ref 8.6–10.2)
Creatinine, Ser: 1.26 mg/dL (ref 0.76–1.27)
GFR calc Af Amer: 66 mL/min/{1.73_m2} (ref >59)
GFR calc non Af Amer: 57 mL/min/{1.73_m2} — ABNORMAL LOW (ref >59)
GLUCOSE: 166 mg/dL — AB (ref 65–99)
Globulin, Total: 2.6 g/dL (ref 1.5–4.5)
Potassium: 5 mmol/L (ref 3.5–5.2)
SODIUM: 142 mmol/L (ref 134–144)
Total Protein: 7.2 g/dL (ref 6.0–8.5)

## 2016-12-09 LAB — LIPID PANEL
CHOLESTEROL TOTAL: 176 mg/dL (ref 100–199)
Chol/HDL Ratio: 3.9 ratio (ref 0.0–5.0)
HDL: 45 mg/dL (ref >39)
LDL Calculated: 52 mg/dL (ref 0–99)
TRIGLYCERIDES: 397 mg/dL — AB (ref 0–149)
VLDL Cholesterol Cal: 79 mg/dL — ABNORMAL HIGH (ref 5–40)

## 2016-12-09 LAB — MICROALBUMIN / CREATININE URINE RATIO
Creatinine, Urine: 115.7 mg/dL
MICROALB/CREAT RATIO: 30.8 mg/g{creat} — AB (ref 0.0–30.0)
Microalbumin, Urine: 35.6 ug/mL

## 2016-12-11 ENCOUNTER — Encounter: Payer: Self-pay | Admitting: Family Medicine

## 2016-12-11 DIAGNOSIS — E1165 Type 2 diabetes mellitus with hyperglycemia: Principal | ICD-10-CM

## 2016-12-11 DIAGNOSIS — IMO0001 Reserved for inherently not codable concepts without codable children: Secondary | ICD-10-CM

## 2016-12-21 NOTE — Progress Notes (Signed)
Cardiology Office Note   Date:  12/22/2016   ID:  [name], [name] 10/08/1945, MRN [id number]  PCP:  Dettinger, Fransisca Kaufmann, MD  Cardiologist:   Minus Breeding, MD  Referring:  Dettinger, Fransisca Kaufmann, MD  Chief Complaint  Patient presents with  . Shortness of Breath      History of Present Illness: Jeffrey Rubio is a 71 y.o. male who is referred by Dettinger, Fransisca Kaufmann, MD for evaluation of SOB.  He has no past cardiac history. He had a distant stress test. He does have cardiovascular risk factors. He says he's been getting short of breath with activities. He feels like he can't get a deep breath. He sometimes has a cough with some colored sputum production. He doesn't have fevers or chills. 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 He does not however describe chest pressure, neck or arm discomfort. He doesn't describe palpitations, presyncope or syncope. He's had no PND or orthopnea. He's not been walking as much for exercise as he was doing. He gained some weight.  Past Medical History:  Diagnosis Date  . Asthmatic bronchitis   . BPH (benign prostatic hypertrophy)   . Cataracts, bilateral   . Colon polyps   . Diabetes mellitus   . Diverticulosis   . Fibromyalgia   . Gout   . History of nephrectomy, unilateral    left   . Hyperlipidemia   . Hypertension   . Single kidney     Past Surgical History:  Procedure Laterality Date  . APPENDECTOMY    . CHOLECYSTECTOMY    . COLONOSCOPY W/ POLYPECTOMY    . EYE SURGERY     catarat - right   . 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 aspirin 81 MG tablet Take 81 mg by mouth daily.    Jolyne Loa Grape-Goldenseal (BERBERINE COMPLEX) 200-200-50 MG CAPS Take 1 Can by mouth 2 (two) times daily.    . Cholecalciferol (VITAMIN D) 2000 UNITS tablet Take 2,000 Units by mouth daily.      . cyanocobalamin 100 MCG  tablet Take 100 mcg by mouth daily.    . famotidine (PEPCID) 20 MG tablet TAKE 1 TABLET BY MOUTH EVERY DAY 30 tablet 0  . glimepiride (AMARYL) 4 MG tablet TAKE 1 TABLET BY MOUTH EVERY DAY BEFORE BREAKFAST 30 tablet 0  . glucose blood test strip Check BS QD and PRN 100 each 11  . irbesartan (AVAPRO) 300 MG tablet TAKE 1 TABLET BY MOUTH EVERY DAY 30 tablet 0  . magnesium gluconate (MAGONATE) 500 MG tablet Take 500 mg by mouth daily.    . metFORMIN (GLUCOPHAGE) 500 MG tablet TAKE 1 TABLET BY MOUTH TWO TIMES DAILY WITH MEALS 60 tablet 0  . pravastatin (PRAVACHOL) 80 MG tablet Take 1 tablet (80 mg total) by mouth daily. 90 tablet 0  . psyllium (METAMUCIL) 58.6 % packet Take 1 packet by mouth daily.    . traMADol (ULTRAM) 50 MG tablet Take by mouth every 6 (six) hours as needed.    . zinc gluconate 50 MG tablet Take 50 mg by mouth daily.       No current facility-administered medications for this visit.     Allergies:   Viibryd [vilazodone hcl]    Social History:  The patient  reports that he quit smoking about 46 years ago. His  smoking use included Cigarettes. He has a 8.00 pack-year smoking history. He has never used smokeless tobacco. He reports that he does not drink alcohol or use drugs.   Family History:  The patient's family history includes Alzheimer's disease (age of onset: 44) in his mother; Breast cancer in his maternal grandmother; CAD (age of onset: 69) in his father; Cancer in his father; Coronary artery disease in his unknown relative; Diabetes in his paternal grandmother; Hypercalcemia in his brother; Hypertension in his brother, sister, and unknown relative; Kidney Stones in his son; Other in his paternal grandmother; Stroke in his father; Suicidality in his paternal grandfather.    ROS:  Please see the history of present illness.   Otherwise, review of systems are positive for none.   All other systems are reviewed and negative.    PHYSICAL EXAM: VS:  BP (!) 142/76   Pulse (!)  52   Ht 5\' 7"  (1.702 m)   Wt 187 lb (84.8 kg)   BMI 29.29 kg/m  , BMI Body mass index is 29.29 kg/m. GENERAL:  Well appearing HEENT:  Pupils equal round and reactive, fundi not visualized, oral mucosa unremarkable NECK:  No jugular venous distention, waveform within normal limits, carotid upstroke brisk and symmetric, no bruits, no thyromegaly LYMPHATICS:  No cervical, inguinal adenopathy LUNGS:  Clear to auscultation bilaterally BACK:  No CVA tenderness CHEST:  Unremarkable HEART:  PMI not displaced or sustained,S1 and S2 within normal limits, no S3, no S4, no clicks, no rubs, no murmurs ABD:  Flat, positive bowel sounds normal in frequency in pitch, no bruits, no rebound, no guarding, no midline pulsatile mass, no hepatomegaly, no splenomegaly EXT:  2 plus pulses throughout, no edema, no cyanosis no clubbing SKIN:  No rashes no nodules NEURO:  Cranial nerves II through XII grossly intact, motor grossly intact throughout PSYCH:  Cognitively intact, oriented to person place and time    EKG:  EKG is ordered today. The ekg ordered today demonstrates sinus rhythm, rate 52, axis within normal limits, intervals within normal limits, no acute ST-T wave changes.   Recent Labs: 12/08/2016: ALT 17; BUN 20; Creatinine, Ser 1.26; Potassium 5.0; Sodium 142    Lipid Panel    Component Value Date/Time   CHOL 176 12/08/2016 1051   CHOL 161 10/22/2012 1032   TRIG 397 (H) 12/08/2016 1051   TRIG 211 (H) 06/03/2013 0925   TRIG 175 (H) 10/22/2012 1032   HDL 45 12/08/2016 1051   HDL 45 06/03/2013 0925   HDL 46 10/22/2012 1032   CHOLHDL 3.9 12/08/2016 1051   LDLCALC 52 12/08/2016 1051   LDLCALC 63 06/03/2013 0925   LDLCALC 80 10/22/2012 1032      Wt Readings from Last 3 Encounters:  12/22/16 187 lb (84.8 kg)  12/08/16 184 lb (83.5 kg)  03/23/16 186 lb (84.4 kg)      Other studies Reviewed: Additional studies/ records that were reviewed today include: Labs. Review of the above records  demonstrates:  Please see elsewhere in the note.     ASSESSMENT AND PLAN:  SOB:  This certainly could be an anginal equivalent. I will bring the patient back for a POET (Plain Old Exercise Test). This will allow me to screen for obstructive coronary disease, risk stratify and very importantly provide a prescription for exercise.  DM:  His A1C was 7.5.  He is working on this with diet and exercise and we discussed specifics for this.    DYSLIPIDEMIA:  His LDL  was 52 and HDL of 45.  He will remain on meds as listed.     Current medicines are reviewed at length with the patient today.  The patient does not have concerns regarding medicines.  The following changes have been made:  no change  Labs/ tests ordered today include:   Orders Placed This Encounter  Procedures  . Exercise Tolerance Test  . EKG 12-Lead     Disposition:   FU with me as needed.      Signed, Minus Breeding, MD  12/22/2016 2:12 PM    [facility]

## 2016-12-22 ENCOUNTER — Ambulatory Visit (INDEPENDENT_AMBULATORY_CARE_PROVIDER_SITE_OTHER): Payer: Medicare Other | Admitting: Cardiology

## 2016-12-22 ENCOUNTER — Encounter: Payer: Self-pay | Admitting: Cardiology

## 2016-12-22 VITALS — BP 142/76 | HR 52 | Ht 67.0 in | Wt 187.0 lb

## 2016-12-22 DIAGNOSIS — I209 Angina pectoris, unspecified: Secondary | ICD-10-CM

## 2016-12-22 DIAGNOSIS — E785 Hyperlipidemia, unspecified: Secondary | ICD-10-CM

## 2016-12-22 DIAGNOSIS — R0602 Shortness of breath: Secondary | ICD-10-CM

## 2016-12-22 NOTE — Patient Instructions (Signed)
Medication Instructions:  Continue current medications  If you need a refill on your cardiac medications before your next appointment, please call your pharmacy.  Labwork: None Ordered   Testing/Procedures: Your physician has requested that you have an exercise tolerance test. For further information please visit [email or web address]. Please also follow instruction sheet, as given.   Follow-Up: Your physician wants you to follow-up in: As Needed.     Thank you for choosing [facility]!!       

## 2016-12-28 ENCOUNTER — Telehealth (HOSPITAL_COMMUNITY): Payer: Self-pay

## 2016-12-28 NOTE — Telephone Encounter (Signed)
Encounter complete. 

## 2017-01-01 ENCOUNTER — Other Ambulatory Visit: Payer: Self-pay | Admitting: Family

## 2017-01-01 DIAGNOSIS — I10 Essential (primary) hypertension: Secondary | ICD-10-CM

## 2017-01-01 DIAGNOSIS — E119 Type 2 diabetes mellitus without complications: Secondary | ICD-10-CM

## 2017-01-02 ENCOUNTER — Ambulatory Visit (HOSPITAL_COMMUNITY)
Admission: RE | Admit: 2017-01-02 | Discharge: 2017-01-02 | Disposition: A | Discharge status: Home / Self Care | Payer: Medicare Other | Source: Ambulatory Visit | Attending: Cardiovascular Disease | Admitting: Cardiovascular Disease

## 2017-01-02 DIAGNOSIS — R0602 Shortness of breath: Secondary | ICD-10-CM | POA: Diagnosis not present

## 2017-01-02 LAB — EXERCISE TOLERANCE TEST
CHL CUP MPHR: 149 {beats}/min
CSEPPHR: 153 {beats}/min
Estimated workload: 9.4 METS
Exercise duration (min): 7 min
Exercise duration (sec): 36 s
Percent HR: 102 %
RPE: 17
Rest HR: 56 {beats}/min

## 2017-01-08 ENCOUNTER — Telehealth: Payer: Self-pay | Admitting: Cardiology

## 2017-01-08 DIAGNOSIS — Z5181 Encounter for therapeutic drug level monitoring: Secondary | ICD-10-CM

## 2017-01-08 DIAGNOSIS — Z7982 Long term (current) use of aspirin: Secondary | ICD-10-CM

## 2017-01-08 DIAGNOSIS — Z01812 Encounter for preprocedural laboratory examination: Secondary | ICD-10-CM

## 2017-01-08 DIAGNOSIS — Z79899 Other long term (current) drug therapy: Secondary | ICD-10-CM

## 2017-01-08 DIAGNOSIS — R9439 Abnormal result of other cardiovascular function study: Secondary | ICD-10-CM

## 2017-01-08 NOTE — Telephone Encounter (Signed)
Patient called to discuss plans for [facility]. He noted he is flexible w/his schedule this week (except for 10/10). Reviewed his medications and discussed plan to HOLD metformin 24 hrs prior to cath and HOLD glimerpiride AM of procedure. He is aware he needs lab work done prior to procedure and he goes to [facility] for labs.

## 2017-01-08 NOTE — Telephone Encounter (Signed)
@  [facility] [address] [place] [address] Dept: [phone] Loc: [place]  01/08/2017  You are scheduled for a Cardiac Catheterization on Thursday, October 11 with Dr. Peter Martinique.  1. Please arrive at the [facility] (Main Entrance A) at [facility]: [address] [place], [place] [address] at 8:00 AM (two hours before your procedure to ensure your preparation). Free valet parking service is available.   Special note: Every effort is made to have your procedure done on time. Please understand that emergencies sometimes delay scheduled procedures.  2. Diet: Do not eat or drink anything after midnight prior to your procedure except sips of water to take medications.  3. Labs: You will need to have lab work done Tuesday October 9 @ [place]  4. Medication instructions in preparation for your procedure: -- HOLD metformin 24 hours prior to your procedure -- HOLD glimepiride the morning of your procedure On the morning of your procedure, take your Aspirin and any morning medicines NOT listed above.  You may use sips of water.  5. Plan for one night stay--bring personal belongings. 6. Bring a current list of your medications and current insurance cards. 7. You MUST have a responsible person to drive you home. 8. Someone MUST be with you the first 24 hours after you arrive home or your discharge will be delayed. 9. Please wear clothes that are easy to get on and off and wear slip-on shoes.  Thank you for allowing [place] to care for you!   -- [facility]

## 2017-01-08 NOTE — Telephone Encounter (Signed)
Called [facility] cath lab and scheduled LHC with Santiago Glad for Thursday October 11 @ 60 AM with Dr. Martinique - patient to arrive at 8 AM  Called [facility] to inquire about patient have pre-procedure labs at their office. OK to order (future, lab collect, [facility])  Patient called and notified of procedure date/time/location and he agrees with this. He is aware he will need to have labs tomorrow 10/9. MyChart message w/cath letter sent to patient.

## 2017-01-08 NOTE — Telephone Encounter (Signed)
Notes recorded by Minus Breeding, MD on 01/07/2017 at 1:25 PM EDT Discussed the results with the patient. Needs cardiac cath. The patient understands that risks included but are not limited to stroke (1 in 1000), death (1 in 30), kidney failure [usually temporary] (1 in 500), bleeding (1 in 200), allergic reaction [possibly serious] (1 in 200). The patient understands and agrees to proceed.  Please call to arrange the procedure. He will need labs as his are one month old today.

## 2017-01-09 ENCOUNTER — Other Ambulatory Visit: Payer: Medicare Other

## 2017-01-09 DIAGNOSIS — Z5181 Encounter for therapeutic drug level monitoring: Secondary | ICD-10-CM | POA: Diagnosis not present

## 2017-01-09 DIAGNOSIS — Z7982 Long term (current) use of aspirin: Secondary | ICD-10-CM | POA: Diagnosis not present

## 2017-01-09 DIAGNOSIS — Z01812 Encounter for preprocedural laboratory examination: Secondary | ICD-10-CM | POA: Diagnosis not present

## 2017-01-09 DIAGNOSIS — Z79899 Other long term (current) drug therapy: Secondary | ICD-10-CM

## 2017-01-09 LAB — BASIC METABOLIC PANEL
BUN / CREAT RATIO: 15 (ref 10–24)
BUN: 18 mg/dL (ref 8–27)
CHLORIDE: 106 mmol/L (ref 96–106)
CO2: 23 mmol/L (ref 20–29)
Calcium: 9 mg/dL (ref 8.6–10.2)
Creatinine, Ser: 1.19 mg/dL (ref 0.76–1.27)
GFR calc Af Amer: 71 mL/min/{1.73_m2} (ref >59)
GFR calc non Af Amer: 61 mL/min/{1.73_m2} (ref >59)
GLUCOSE: 147 mg/dL — AB (ref 65–99)
POTASSIUM: 4.4 mmol/L (ref 3.5–5.2)
Sodium: 143 mmol/L (ref 134–144)

## 2017-01-09 LAB — CBC
Hematocrit: 39.9 % (ref 37.5–51.0)
Hemoglobin: 13.4 g/dL (ref 13.0–17.7)
MCH: 30.4 pg (ref 26.6–33.0)
MCHC: 33.6 g/dL (ref 31.5–35.7)
MCV: 91 fL (ref 79–97)
Platelets: 249 10*3/uL (ref 150–379)
RBC: 4.41 x10E6/uL (ref 4.14–5.80)
RDW: 14.3 % (ref 12.3–15.4)
WBC: 6.5 10*3/uL (ref 3.4–10.8)

## 2017-01-10 ENCOUNTER — Telehealth: Payer: Self-pay

## 2017-01-10 ENCOUNTER — Encounter: Payer: Medicare Other | Attending: Family Medicine | Admitting: Nutrition

## 2017-01-10 VITALS — Ht 67.0 in | Wt 186.5 lb

## 2017-01-10 DIAGNOSIS — E1165 Type 2 diabetes mellitus with hyperglycemia: Secondary | ICD-10-CM | POA: Insufficient documentation

## 2017-01-10 DIAGNOSIS — Z713 Dietary counseling and surveillance: Secondary | ICD-10-CM | POA: Diagnosis not present

## 2017-01-10 DIAGNOSIS — IMO0002 Reserved for concepts with insufficient information to code with codable children: Secondary | ICD-10-CM

## 2017-01-10 DIAGNOSIS — E118 Type 2 diabetes mellitus with unspecified complications: Secondary | ICD-10-CM

## 2017-01-10 DIAGNOSIS — E669 Obesity, unspecified: Secondary | ICD-10-CM

## 2017-01-10 LAB — PROTIME-INR
INR: 1 (ref 0.8–1.2)
PROTHROMBIN TIME: 10.4 s (ref 9.1–12.0)

## 2017-01-10 LAB — SPECIMEN STATUS REPORT

## 2017-01-10 NOTE — Progress Notes (Signed)
Diabetes Self-Management Education  Visit Type: First/Initial  Appt. Start Time: 0800 Appt. End Time: 0930  01/10/2017  [name], identified by name and date of birth, is a 71 y.o. male with a diagnosis of Diabetes: Type 2. Here with his wife. His wife does most of the cooking. They have been trying to cut out junk food, processed foods, lots of foods with carbs. He admits to skipping meals at times. Works on their farm. He notes blood sugars are getting worse than better even though he has cut out a bunch of food. Drinks a little water.  A1C 7.5%. A1C has been down to 6% in the past. Meds: He is Glucophage 500 mg BID and Glimepiride. Denies low blood sugars. Stays hungry at times and feels like he is starving sometimes. He hasn't been testing blood sugars much lately.  ASSESSMENT  Height 5\' 7"  (1.702 m), weight 186 lb 8 oz (84.6 kg). Body mass index is 29.21 kg/m.      Diabetes Self-Management Education - 01/10/17 0810      Visit Information   Visit Type First/Initial     Initial Visit   Diabetes Type Type 2   Are you currently following a meal plan? No   Are you taking your medications as prescribed? Yes   Date Diagnosed 1990's     Health Coping   How would you rate your overall health? Good     Psychosocial Assessment   Patient Belief/Attitude about Diabetes Motivated to manage diabetes   Self-care barriers None   Self-management support Doctor's office;Family   Other persons present Patient;Spouse/SO   Patient Concerns Nutrition/Meal planning;Medication;Monitoring;Healthy Lifestyle;Problem Solving;Glycemic Control;Weight Control   Special Needs None   Preferred Learning Style No preference indicated   Learning Readiness Change in progress   How often do you need to have someone help you when you read instructions, pamphlets, or other written materials from your doctor or pharmacy? 1 - Never   What is the last grade level you completed in school? 12      Pre-Education Assessment   Patient understands the diabetes disease and treatment process. Needs Review   Patient understands incorporating nutritional management into lifestyle. Needs Review   Patient undertands incorporating physical activity into lifestyle. Needs Review   Patient understands using medications safely. Needs Review   Patient understands monitoring blood glucose, interpreting and using results Needs Review   Patient understands prevention, detection, and treatment of acute complications. Needs Review   Patient understands prevention, detection, and treatment of chronic complications. Needs Review   Patient understands how to develop strategies to address psychosocial issues. Needs Review   Patient understands how to develop strategies to promote health/change behavior. Needs Review     Complications   Last HgB A1C per patient/outside source 7.5 %   Fasting Blood glucose range (mg/dL) 130-179   Postprandial Blood glucose range (mg/dL) 130-179   Number of hypoglycemic episodes per month 0   Number of hyperglycemic episodes per week 3   Can you tell when your blood sugar is high? Yes   What do you do if your blood sugar is high? ni   Have you had a dilated eye exam in the past 12 months? Yes   Have you had a dental exam in the past 12 months? Yes   Are you checking your feet? Yes   How many days per week are you checking your feet? 7     Dietary Intake   Breakfast 1 egg,  sausage, grits 1/2 c and biscuit,  coffee   Lunch ham sandwich with mustard, unsweet tea, stevia   Snack (afternoon) 3-4 peaches slics 1/2 c   Dinner 2 pork tenderoloin, gravy,  water   Snack (evening) cheese  1 oz   Beverage(s) water, unsweet tea     Exercise   Exercise Type Light (walking / raking leaves)   How many days per week to you exercise? 4   How many minutes per day do you exercise? 30   Total minutes per week of exercise 120     Patient Education   Previous Diabetes Education No    Disease state  Explored patient's options for treatment of their diabetes   Nutrition management  Role of diet in the treatment of diabetes and the relationship between the three main macronutrients and blood glucose level;Carbohydrate counting;Food label reading, portion sizes and measuring food.;Meal timing in regards to the patients' current diabetes medication.;Information on hints to eating out and maintain blood glucose control.;Meal options for control of blood glucose level and chronic complications.;Reviewed blood glucose goals for pre and post meals and how to evaluate the patients' food intake on their blood glucose level.   Physical activity and exercise  Role of exercise on diabetes management, blood pressure control and cardiac health.   Medications Reviewed medication adjustment guidelines for hyperglycemia and sick days.   Monitoring Interpreting lab values - A1C, lipid, urine microalbumina.;Identified appropriate SMBG and/or A1C goals.;Taught/evaluated SMBG meter.;Purpose and frequency of SMBG.   Chronic complications Lipid levels, blood glucose control and heart disease;Relationship between chronic complications and blood glucose control;Identified and discussed with patient  current chronic complications;Retinopathy and reason for yearly dilated eye exams;Nephropathy, what it is, prevention of, the use of ACE, ARB's and early detection of through urine microalbumia.;Reviewed with patient heart disease, higher risk of, and prevention   Psychosocial adjustment Role of stress on diabetes     Individualized Goals (developed by patient)   Nutrition Follow meal plan discussed;General guidelines for healthy choices and portions discussed;Adjust meds/carbs with exercise as discussed   Physical Activity Exercise 3-5 times per week;30 minutes per day   Medications take my medication as prescribed   Monitoring  test my blood glucose as discussed   Reducing Risk examine blood glucose patterns      Post-Education Assessment   Patient understands the diabetes disease and treatment process. Needs Review   Patient understands incorporating nutritional management into lifestyle. Needs Review   Patient undertands incorporating physical activity into lifestyle. Needs Review   Patient understands using medications safely. Needs Review   Patient understands monitoring blood glucose, interpreting and using results Needs Review   Patient understands prevention, detection, and treatment of acute complications. Needs Review   Patient understands prevention, detection, and treatment of chronic complications. Needs Review   Patient understands how to develop strategies to address psychosocial issues. Needs Review   Patient understands how to develop strategies to promote health/change behavior. Needs Review     Outcomes   Expected Outcomes Demonstrated interest in learning. Expect positive outcomes   Future DMSE 4-6 wks   Program Status Completed      Individualized Plan for Diabetes Self-Management Training:   Learning Objective:  Patient will have a greater understanding of diabetes self-management. Patient education plan is to attend individual and/or group sessions per assessed needs and concerns.   Plan:   Patient Instructions  Goals Follow Plate Method Eat three meals per day Drink 8 cups of water per day  Don't eat past 7 pm Don't skip meals Walk 30 minutes a day Test blood sugars twice a day Take Metformin AFTER breakfast and dinner.   Expected Outcomes:  Demonstrated interest in learning. Expect positive outcomes  Education material provided: Living Well with Diabetes, Food label handouts, A1C conversion sheet, Meal plan card, My Plate and Carbohydrate counting sheet  If problems or questions, patient to contact team via:  Phone and Email  Future DSME appointment: 4-6 wks

## 2017-01-10 NOTE — Telephone Encounter (Signed)
Left detailed message per DPR  Patient contacted pre-catheterization at [facility] scheduled for:  01/11/2017 @ 1030 Verified arrival time and place:  NT @ 0800 Confirmed AM meds to be taken pre-cath with sip of water: Take ASA Hold Amaryl morning of Hold Metformin last dose 01/09/2017 evening dose  Left this nurse name and # for call back if any questions

## 2017-01-10 NOTE — Patient Instructions (Signed)
Goals Follow Plate Method Eat three meals per day Drink 8 cups of water per day Don't eat past 7 pm Don't skip meals Walk 30 minutes a day Test blood sugars twice a day

## 2017-01-11 ENCOUNTER — Ambulatory Visit (HOSPITAL_COMMUNITY)
Admission: RE | Admit: 2017-01-11 | Discharge: 2017-01-11 | Disposition: A | Discharge status: Home / Self Care | Payer: Medicare Other | Source: Ambulatory Visit | Attending: Cardiology | Admitting: Cardiology

## 2017-01-11 ENCOUNTER — Encounter (HOSPITAL_COMMUNITY)
Admission: RE | Disposition: A | Discharge status: Home / Self Care | Payer: Self-pay | Source: Ambulatory Visit | Attending: Cardiology

## 2017-01-11 DIAGNOSIS — M797 Fibromyalgia: Secondary | ICD-10-CM | POA: Insufficient documentation

## 2017-01-11 DIAGNOSIS — E119 Type 2 diabetes mellitus without complications: Secondary | ICD-10-CM | POA: Diagnosis not present

## 2017-01-11 DIAGNOSIS — Z79899 Other long term (current) drug therapy: Secondary | ICD-10-CM | POA: Insufficient documentation

## 2017-01-11 DIAGNOSIS — E1159 Type 2 diabetes mellitus with other circulatory complications: Secondary | ICD-10-CM | POA: Diagnosis present

## 2017-01-11 DIAGNOSIS — Z7984 Long term (current) use of oral hypoglycemic drugs: Secondary | ICD-10-CM | POA: Insufficient documentation

## 2017-01-11 DIAGNOSIS — Z7982 Long term (current) use of aspirin: Secondary | ICD-10-CM | POA: Insufficient documentation

## 2017-01-11 DIAGNOSIS — E1169 Type 2 diabetes mellitus with other specified complication: Secondary | ICD-10-CM | POA: Diagnosis present

## 2017-01-11 DIAGNOSIS — R9439 Abnormal result of other cardiovascular function study: Secondary | ICD-10-CM | POA: Insufficient documentation

## 2017-01-11 DIAGNOSIS — E785 Hyperlipidemia, unspecified: Secondary | ICD-10-CM | POA: Insufficient documentation

## 2017-01-11 DIAGNOSIS — N400 Enlarged prostate without lower urinary tract symptoms: Secondary | ICD-10-CM | POA: Insufficient documentation

## 2017-01-11 DIAGNOSIS — Z905 Acquired absence of kidney: Secondary | ICD-10-CM | POA: Diagnosis not present

## 2017-01-11 DIAGNOSIS — R0600 Dyspnea, unspecified: Secondary | ICD-10-CM | POA: Diagnosis not present

## 2017-01-11 DIAGNOSIS — J45909 Unspecified asthma, uncomplicated: Secondary | ICD-10-CM | POA: Insufficient documentation

## 2017-01-11 DIAGNOSIS — M109 Gout, unspecified: Secondary | ICD-10-CM | POA: Insufficient documentation

## 2017-01-11 DIAGNOSIS — I10 Essential (primary) hypertension: Secondary | ICD-10-CM | POA: Diagnosis not present

## 2017-01-11 HISTORY — PX: LEFT HEART CATH AND CORONARY ANGIOGRAPHY: CATH118249

## 2017-01-11 LAB — GLUCOSE, CAPILLARY: GLUCOSE-CAPILLARY: 130 mg/dL — AB (ref 65–99)

## 2017-01-11 SURGERY — LEFT HEART CATH AND CORONARY ANGIOGRAPHY
Anesthesia: LOCAL

## 2017-01-11 MED ORDER — ASPIRIN 81 MG PO CHEW
81.0000 mg | CHEWABLE_TABLET | ORAL | Status: DC
Start: 2017-01-11 — End: 2017-01-11

## 2017-01-11 MED ORDER — LIDOCAINE HCL 2 % IJ SOLN
INTRAMUSCULAR | Status: DC | PRN
  Administered 2017-01-11: 2 mL

## 2017-01-11 MED ORDER — HEPARIN SODIUM (PORCINE) 1000 UNIT/ML IJ SOLN
INTRAMUSCULAR | Status: AC
  Filled 2017-01-11: qty 1

## 2017-01-11 MED ORDER — SODIUM CHLORIDE 0.9 % IV SOLN: 250.0000 mL | INTRAVENOUS | Status: DC | PRN

## 2017-01-11 MED ORDER — SODIUM CHLORIDE 0.9% FLUSH: 3.0000 mL | Freq: Two times a day (BID) | INTRAVENOUS | Status: DC

## 2017-01-11 MED ORDER — FENTANYL CITRATE (PF) 100 MCG/2ML IJ SOLN
INTRAMUSCULAR | Status: AC
  Filled 2017-01-11: qty 2

## 2017-01-11 MED ORDER — SODIUM CHLORIDE 0.9 % WEIGHT BASED INFUSION: 1.0000 mL/kg/h | INTRAVENOUS | Status: DC

## 2017-01-11 MED ORDER — HEPARIN (PORCINE) IN NACL 2-0.9 UNIT/ML-% IJ SOLN
INTRAMUSCULAR | Status: DC | PRN
  Administered 2017-01-11: 11:00:00

## 2017-01-11 MED ORDER — IOPAMIDOL (ISOVUE-370) INJECTION 76%
INTRAVENOUS | Status: DC | PRN
  Administered 2017-01-11: 75 mL

## 2017-01-11 MED ORDER — LIDOCAINE HCL (PF) 1 % IJ SOLN: INTRAMUSCULAR | Status: DC | PRN

## 2017-01-11 MED ORDER — VERAPAMIL HCL 2.5 MG/ML IV SOLN
INTRAVENOUS | Status: AC
  Filled 2017-01-11: qty 2

## 2017-01-11 MED ORDER — SODIUM CHLORIDE 0.9 % WEIGHT BASED INFUSION
3.0000 mL/kg/h | INTRAVENOUS | Status: AC
  Administered 2017-01-11: 3 mL/kg/h via INTRAVENOUS

## 2017-01-11 MED ORDER — SODIUM CHLORIDE 0.9% FLUSH: 3.0000 mL | INTRAVENOUS | Status: DC | PRN

## 2017-01-11 MED ORDER — HEPARIN SODIUM (PORCINE) 1000 UNIT/ML IJ SOLN
INTRAMUSCULAR | Status: DC | PRN
  Administered 2017-01-11: 4500 [IU] via INTRAVENOUS

## 2017-01-11 MED ORDER — METFORMIN HCL 500 MG PO TABS: ORAL_TABLET | ORAL | 0 refills | Status: DC

## 2017-01-11 MED ORDER — VERAPAMIL HCL 2.5 MG/ML IV SOLN
INTRAVENOUS | Status: DC | PRN
  Administered 2017-01-11: 10 mL via INTRA_ARTERIAL

## 2017-01-11 MED ORDER — MIDAZOLAM HCL 2 MG/2ML IJ SOLN
INTRAMUSCULAR | Status: DC | PRN
  Administered 2017-01-11: 2 mg via INTRAVENOUS

## 2017-01-11 MED ORDER — HEPARIN (PORCINE) IN NACL 2-0.9 UNIT/ML-% IJ SOLN
INTRAMUSCULAR | Status: AC
  Filled 2017-01-11: qty 1000

## 2017-01-11 MED ORDER — LIDOCAINE HCL 2 % IJ SOLN
INTRAMUSCULAR | Status: AC
  Filled 2017-01-11: qty 10

## 2017-01-11 MED ORDER — MIDAZOLAM HCL 2 MG/2ML IJ SOLN
INTRAMUSCULAR | Status: AC
  Filled 2017-01-11: qty 2

## 2017-01-11 MED ORDER — FENTANYL CITRATE (PF) 100 MCG/2ML IJ SOLN
INTRAMUSCULAR | Status: DC | PRN
  Administered 2017-01-11: 25 ug via INTRAVENOUS

## 2017-01-11 MED ORDER — IOPAMIDOL (ISOVUE-370) INJECTION 76%
INTRAVENOUS | Status: AC
  Filled 2017-01-11: qty 100

## 2017-01-11 MED ORDER — SODIUM CHLORIDE 0.9 % WEIGHT BASED INFUSION: 1.0000 mL/kg/h | INTRAVENOUS | Status: AC

## 2017-01-11 SURGICAL SUPPLY — 10 items
CATH IMPULSE 5F ANG/FL3.5 (CATHETERS) ×2 IMPLANT
DEVICE RAD COMP TR BAND LRG (VASCULAR PRODUCTS) ×2 IMPLANT
GLIDESHEATH SLEND SS 6F .021 (SHEATH) ×2 IMPLANT
GUIDEWIRE INQWIRE 1.5J.035X260 (WIRE) ×1 IMPLANT
INQWIRE 1.5J .035X260CM (WIRE) ×2
KIT HEART LEFT (KITS) ×2 IMPLANT
PACK CARDIAC CATHETERIZATION (CUSTOM PROCEDURE TRAY) ×2 IMPLANT
SYR MEDRAD MARK V 150ML (SYRINGE) ×2 IMPLANT
TRANSDUCER W/STOPCOCK (MISCELLANEOUS) ×2 IMPLANT
TUBING CIL FLEX 10 FLL-RA (TUBING) ×2 IMPLANT

## 2017-01-11 NOTE — Interval H&P Note (Signed)
History and Physical Interval Note:  01/11/2017 10:43 AM  Jeffrey Rubio  has presented today for surgery, with the diagnosis of abnormal stress test  The various methods of treatment have been discussed with the patient and family. After consideration of risks, benefits and other options for treatment, the patient has consented to  Procedure(s): LEFT HEART CATH AND CORONARY ANGIOGRAPHY (N/A) as a surgical intervention .  The patient's history has been reviewed, patient examined, no change in status, stable for surgery.  I have reviewed the patient's chart and labs.  Questions were answered to the patient's satisfaction.   Cath Lab Visit (complete for each Cath Lab visit)  Clinical Evaluation Leading to the Procedure:   ACS: No.  Non-ACS:    Anginal Classification: CCS II  Anti-ischemic medical therapy: No Therapy  Non-Invasive Test Results: Intermediate-risk stress test findings: cardiac mortality 1-3%/year  Prior CABG: No previous CABG       [name] [facility] 01/11/2017 10:43 AM

## 2017-01-11 NOTE — Discharge Instructions (Addendum)
Resume metformin 01/13/17.   Radial Site Care Refer to this sheet in the next few weeks. These instructions provide you with information about caring for yourself after your procedure. Your health care provider may also give you more specific instructions. Your treatment has been planned according to current medical practices, but problems sometimes occur. Call your health care provider if you have any problems or questions after your procedure. What can I expect after the procedure? After your procedure, it is typical to have the following:  Bruising at the radial site that usually fades within 1-2 weeks.  Blood collecting in the tissue (hematoma) that may be painful to the touch. It should usually decrease in size and tenderness within 1-2 weeks.  Follow these instructions at home:  Take medicines only as directed by your health care provider.  You may shower 24-48 hours after the procedure or as directed by your health care provider. Remove the bandage (dressing) and gently wash the site with plain soap and water. Pat the area dry with a clean towel. Do not rub the site, because this may cause bleeding.  Do not take baths, swim, or use a hot tub until your health care provider approves.  Check your insertion site every day for redness, swelling, or drainage.  Do not apply powder or lotion to the site.  Do not flex or bend the affected arm for 24 hours or as directed by your health care provider.  Do not push or pull heavy objects with the affected arm for 24 hours or as directed by your health care provider.  Do not lift over 10 lb (4.5 kg) for 5 days after your procedure or as directed by your health care provider.  Ask your health care provider when it is okay to: ? Return to work or school. ? Resume usual physical activities or sports. ? Resume sexual activity.  Do not drive home if you are discharged the same day as the procedure. Have someone else drive you.  You may drive  24 hours after the procedure unless otherwise instructed by your health care provider.  Do not operate machinery or power tools for 24 hours after the procedure.  If your procedure was done as an outpatient procedure, which means that you went home the same day as your procedure, a responsible adult should be with you for the first 24 hours after you arrive home.  Keep all follow-up visits as directed by your health care provider. This is important. Contact a health care provider if:  You have a fever.  You have chills.  You have increased bleeding from the radial site. Hold pressure on the site. Get help right away if:  You have unusual pain at the radial site.  You have redness, warmth, or swelling at the radial site.  You have drainage (other than a small amount of blood on the dressing) from the radial site.  The radial site is bleeding, and the bleeding does not stop after 30 minutes of holding steady pressure on the site.  Your arm or hand becomes pale, cool, tingly, or numb. This information is not intended to replace advice given to you by your health care provider. Make sure you discuss any questions you have with your health care provider. Document Released: 04/22/2010 Document Revised: 08/26/2015 Document Reviewed: 10/06/2013 Elsevier Interactive Patient Education  2018 [employer].

## 2017-01-11 NOTE — H&P (View-Only) (Signed)
Cardiology Office Note   Date:  12/22/2016   ID:  Jeffrey Rubio 1945/05/17, MRN [id number]  PCP:  Dettinger, Fransisca Kaufmann, MD  Cardiologist:   Minus Breeding, MD  Referring:  Dettinger, Fransisca Kaufmann, MD  Chief Complaint  Patient presents with  . Shortness of Breath      History of Present Illness: Jeffrey Rubio is a 71 y.o. male who is referred by Dettinger, Fransisca Kaufmann, MD for evaluation of SOB.  He has no past cardiac history. He had a distant stress test. He does have cardiovascular risk factors. He says he's been getting short of breath with activities. He feels like he can't get a deep breath. He sometimes has a cough with some colored sputum production. He doesn't have fevers or chills. 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 He does not however describe chest pressure, neck or arm discomfort. He doesn't describe palpitations, presyncope or syncope. He's had no PND or orthopnea. He's not been walking as much for exercise as he was doing. He gained some weight.  Past Medical History:  Diagnosis Date  . Asthmatic bronchitis   . BPH (benign prostatic hypertrophy)   . Cataracts, bilateral   . Colon polyps   . Diabetes mellitus   . Diverticulosis   . Fibromyalgia   . Gout   . History of nephrectomy, unilateral    left   . Hyperlipidemia   . Hypertension   . Single kidney     Past Surgical History:  Procedure Laterality Date  . APPENDECTOMY    . CHOLECYSTECTOMY    . COLONOSCOPY W/ POLYPECTOMY    . EYE SURGERY     catarat - right   . 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 aspirin 81 MG tablet Take 81 mg by mouth daily.    Jolyne Loa Grape-Goldenseal (BERBERINE COMPLEX) 200-200-50 MG CAPS Take 1 Can by mouth 2 (two) times daily.    . Cholecalciferol (VITAMIN D) 2000 UNITS tablet Take 2,000 Units by mouth daily.      . cyanocobalamin 100 MCG  tablet Take 100 mcg by mouth daily.    . famotidine (PEPCID) 20 MG tablet TAKE 1 TABLET BY MOUTH EVERY DAY 30 tablet 0  . glimepiride (AMARYL) 4 MG tablet TAKE 1 TABLET BY MOUTH EVERY DAY BEFORE BREAKFAST 30 tablet 0  . glucose blood test strip Check BS QD and PRN 100 each 11  . irbesartan (AVAPRO) 300 MG tablet TAKE 1 TABLET BY MOUTH EVERY DAY 30 tablet 0  . magnesium gluconate (MAGONATE) 500 MG tablet Take 500 mg by mouth daily.    . metFORMIN (GLUCOPHAGE) 500 MG tablet TAKE 1 TABLET BY MOUTH TWO TIMES DAILY WITH MEALS 60 tablet 0  . pravastatin (PRAVACHOL) 80 MG tablet Take 1 tablet (80 mg total) by mouth daily. 90 tablet 0  . psyllium (METAMUCIL) 58.6 % packet Take 1 packet by mouth daily.    . traMADol (ULTRAM) 50 MG tablet Take by mouth every 6 (six) hours as needed.    . zinc gluconate 50 MG tablet Take 50 mg by mouth daily.       No current facility-administered medications for this visit.     Allergies:   Viibryd [vilazodone hcl]    Social History:  The patient  reports that he quit smoking about 46 years ago. His  smoking use included Cigarettes. He has a 8.00 pack-year smoking history. He has never used smokeless tobacco. He reports that he does not drink alcohol or use drugs.   Family History:  The patient's family history includes Alzheimer's disease (age of onset: 71) in his mother; Breast cancer in his maternal grandmother; CAD (age of onset: 41) in his father; Cancer in his father; Coronary artery disease in his unknown relative; Diabetes in his paternal grandmother; Hypercalcemia in his brother; Hypertension in his brother, sister, and unknown relative; Kidney Stones in his son; Other in his paternal grandmother; Stroke in his father; Suicidality in his paternal grandfather.    ROS:  Please see the history of present illness.   Otherwise, review of systems are positive for none.   All other systems are reviewed and negative.    PHYSICAL EXAM: VS:  BP (!) 142/76   Pulse (!)  52   Ht 5\' 7"  (1.702 m)   Wt 187 lb (84.8 kg)   BMI 29.29 kg/m  , BMI Body mass index is 29.29 kg/m. GENERAL:  Well appearing HEENT:  Pupils equal round and reactive, fundi not visualized, oral mucosa unremarkable NECK:  No jugular venous distention, waveform within normal limits, carotid upstroke brisk and symmetric, no bruits, no thyromegaly LYMPHATICS:  No cervical, inguinal adenopathy LUNGS:  Clear to auscultation bilaterally BACK:  No CVA tenderness CHEST:  Unremarkable HEART:  PMI not displaced or sustained,S1 and S2 within normal limits, no S3, no S4, no clicks, no rubs, no murmurs ABD:  Flat, positive bowel sounds normal in frequency in pitch, no bruits, no rebound, no guarding, no midline pulsatile mass, no hepatomegaly, no splenomegaly EXT:  2 plus pulses throughout, no edema, no cyanosis no clubbing SKIN:  No rashes no nodules NEURO:  Cranial nerves II through XII grossly intact, motor grossly intact throughout PSYCH:  Cognitively intact, oriented to person place and time    EKG:  EKG is ordered today. The ekg ordered today demonstrates sinus rhythm, rate 52, axis within normal limits, intervals within normal limits, no acute ST-T wave changes.   Recent Labs: 12/08/2016: ALT 17; BUN 20; Creatinine, Ser 1.26; Potassium 5.0; Sodium 142    Lipid Panel    Component Value Date/Time   CHOL 176 12/08/2016 1051   CHOL 161 10/22/2012 1032   TRIG 397 (H) 12/08/2016 1051   TRIG 211 (H) 06/03/2013 0925   TRIG 175 (H) 10/22/2012 1032   HDL 45 12/08/2016 1051   HDL 45 06/03/2013 0925   HDL 46 10/22/2012 1032   CHOLHDL 3.9 12/08/2016 1051   LDLCALC 52 12/08/2016 1051   LDLCALC 63 06/03/2013 0925   LDLCALC 80 10/22/2012 1032      Wt Readings from Last 3 Encounters:  12/22/16 187 lb (84.8 kg)  12/08/16 184 lb (83.5 kg)  03/23/16 186 lb (84.4 kg)      Other studies Reviewed: Additional studies/ records that were reviewed today include: Labs. Review of the above records  demonstrates:  Please see elsewhere in the note.     ASSESSMENT AND PLAN:  SOB:  This certainly could be an anginal equivalent. I will bring the patient back for a POET (Plain Old Exercise Test). This will allow me to screen for obstructive coronary disease, risk stratify and very importantly provide a prescription for exercise.  DM:  His A1C was 7.5.  He is working on this with diet and exercise and we discussed specifics for this.    DYSLIPIDEMIA:  His LDL  was 52 and HDL of 45.  He will remain on meds as listed.     Current medicines are reviewed at length with the patient today.  The patient does not have concerns regarding medicines.  The following changes have been made:  no change  Labs/ tests ordered today include:   Orders Placed This Encounter  Procedures  . Exercise Tolerance Test  . EKG 12-Lead     Disposition:   FU with me as needed.      Signed, Minus Breeding, MD  12/22/2016 2:12 PM    [facility]

## 2017-01-11 NOTE — Research (Signed)
OPTIMIZE Informed Consent   Subject Name: Jeffrey Rubio  Subject met inclusion and exclusion criteria.  The informed consent form, study requirements and expectations were reviewed with the subject and questions and concerns were addressed prior to the signing of the consent form.  The subject verbalized understanding of the trail requirements.  The subject agreed to participate in the [facility] trial and signed the informed consent.  The informed consent was obtained prior to performance of any protocol-specific procedures for the subject.  A copy of the signed informed consent was given to the subject and a copy was placed in the subject's medical record. Only applicable if randomized.   [name] 01/11/2017, 1010AM

## 2017-01-12 ENCOUNTER — Encounter (HOSPITAL_COMMUNITY): Payer: Self-pay | Admitting: Cardiology

## 2017-01-15 ENCOUNTER — Other Ambulatory Visit: Payer: Self-pay | Admitting: *Deleted

## 2017-01-15 ENCOUNTER — Ambulatory Visit (INDEPENDENT_AMBULATORY_CARE_PROVIDER_SITE_OTHER): Payer: Medicare Other

## 2017-01-15 DIAGNOSIS — Z23 Encounter for immunization: Secondary | ICD-10-CM | POA: Diagnosis not present

## 2017-01-15 MED ORDER — GLUCOSE BLOOD VI STRP: ORAL_STRIP | 11 refills | Status: DC

## 2017-01-15 MED ORDER — ACCU-CHEK SOFT TOUCH LANCETS MISC: 11 refills | Status: DC

## 2017-01-19 ENCOUNTER — Other Ambulatory Visit: Payer: Self-pay

## 2017-01-19 MED ORDER — BLOOD GLUCOSE METER KIT: PACK | 0 refills | Status: DC

## 2017-02-03 ENCOUNTER — Other Ambulatory Visit: Payer: Self-pay | Admitting: Family

## 2017-02-07 ENCOUNTER — Other Ambulatory Visit: Payer: Self-pay | Admitting: Family

## 2017-02-07 DIAGNOSIS — I10 Essential (primary) hypertension: Secondary | ICD-10-CM

## 2017-02-07 DIAGNOSIS — E119 Type 2 diabetes mellitus without complications: Secondary | ICD-10-CM

## 2017-02-12 ENCOUNTER — Encounter: Payer: Self-pay | Admitting: *Deleted

## 2017-03-06 ENCOUNTER — Ambulatory Visit (INDEPENDENT_AMBULATORY_CARE_PROVIDER_SITE_OTHER): Payer: Medicare Other | Admitting: Family Medicine

## 2017-03-06 ENCOUNTER — Encounter: Payer: Self-pay | Admitting: Family Medicine

## 2017-03-06 VITALS — BP 138/84 | HR 58 | Temp 98.0°F | Ht 67.0 in | Wt 179.0 lb

## 2017-03-06 DIAGNOSIS — I10 Essential (primary) hypertension: Secondary | ICD-10-CM | POA: Diagnosis not present

## 2017-03-06 DIAGNOSIS — E119 Type 2 diabetes mellitus without complications: Secondary | ICD-10-CM

## 2017-03-06 DIAGNOSIS — E785 Hyperlipidemia, unspecified: Secondary | ICD-10-CM

## 2017-03-06 DIAGNOSIS — E1169 Type 2 diabetes mellitus with other specified complication: Secondary | ICD-10-CM | POA: Diagnosis not present

## 2017-03-06 DIAGNOSIS — I209 Angina pectoris, unspecified: Secondary | ICD-10-CM

## 2017-03-06 DIAGNOSIS — E1159 Type 2 diabetes mellitus with other circulatory complications: Secondary | ICD-10-CM

## 2017-03-06 LAB — BAYER DCA HB A1C WAIVED: HB A1C (BAYER DCA - WAIVED): 7.4 % — ABNORMAL HIGH (ref <7.0)

## 2017-03-06 NOTE — Progress Notes (Signed)
BP 138/84   Pulse (!) 58   Temp 98 F (36.7 C) (Oral)   Ht _0  (1.702 m)   Wt 179 lb (81.2 kg)   BMI 28.04 kg/m    Subjective:    Patient ID: Jeffrey Rubio, male    DOB: Sep 28, 1945, 71 y.o.   MRN: [id number]  HPI: Jeffrey Rubio is a 71 y.o. male presenting on 03/06/2017 for Diabetes (6 mo followup); Hyperlipidemia; and Hypertension (Discuss Avapro, formerly on Amlodipine & Benazepril)   HPI Type 2 diabetes mellitus Patient comes in today for recheck of his diabetes. Patient has been currently taking metformin, stop the Nipride and is seen a diabetic educator and is improving his blood sugars that way.  He has numbers here in the show that most of the time he is running in the 130s-140s now in the a.m but occasionally still has a 170 or 200. Patient is currently on an ACE inhibitor/ARB. Patient has not seen an ophthalmologist this year. Patient denies any issues with their feet.   Hypertension Patient is currently on irbesartan, and their blood pressure today is 138/84. Patient denies any lightheadedness or dizziness. Patient denies headaches, blurred vision, chest pains, shortness of breath, or weakness. Denies any side effects from medication and is content with current medication.   Hyperlipidemia Patient is coming in for recheck of his hyperlipidemia. The patient is currently taking pravastatin. They deny any issues with myalgias or history of liver damage from it. They deny any focal numbness or weakness or chest pain.   Relevant past medical, surgical, family and social history reviewed and updated as indicated. Interim medical history since our last visit reviewed. Allergies and medications reviewed and updated.  Review of Systems  Constitutional: Negative for chills and fever.  Eyes: Negative for discharge.  Respiratory: Negative for shortness of breath and wheezing.   Cardiovascular: Negative for chest pain and leg swelling.  Musculoskeletal: Negative for back  pain and gait problem.  Skin: Negative for rash.  Neurological: Negative for dizziness, weakness, light-headedness, numbness and headaches.  All other systems reviewed and are negative.   Per HPI unless specifically indicated above   Allergies as of 03/06/2017      Reactions   Viibryd [vilazodone Hcl]       Medication List        Accurate as of 03/06/17  8:38 AM. Always use your most recent med list.          accu-chek soft touch lancets Check BS BID and PRN Dx E11.9   acetaminophen 500 MG tablet Commonly known as:  TYLENOL Take 500 mg by mouth every 6 (six) hours as needed for mild pain or moderate pain.   aspirin 81 MG tablet Take 81 mg by mouth daily.   blood glucose meter kit and supplies Dispense based on patient and insurance preference. Use up to four times daily as directed. (FOR ICD-9 250.00, 250.01).   EQL SUPER OMEGA-3 Caps Take 4 capsules by mouth daily. 2 capsules in morning and 2 capsules in the evening   famotidine 20 MG tablet Commonly known as:  PEPCID TAKE 1 TABLET BY MOUTH EVERY DAY   glimepiride 4 MG tablet Commonly known as:  AMARYL TAKE 1 TABLET BY MOUTH EVERY DAY BEFORE BREAKFAST   glucose blood test strip Commonly known as:  ACCU-CHEK AVIVA Check BS BID and PRN dx E11.9   irbesartan 300 MG tablet Commonly known as:  AVAPRO TAKE 1 TABLET BY MOUTH EVERY DAY  irbesartan 300 MG tablet Commonly known as:  AVAPRO TAKE 1 TABLET BY MOUTH EVERY DAY   magnesium gluconate 500 MG tablet Commonly known as:  MAGONATE Take 500 mg by mouth every other day.   METAMUCIL MULTIHEALTH FIBER PO Take 2 packets by mouth 2 (two) times daily. 2 tablespoons morning and at night out of cannister.   metFORMIN 500 MG tablet Commonly known as:  GLUCOPHAGE TAKE 1 TABLET BY MOUTH TWO TIMES DAILY WITH MEALS. Resume 01/13/17.   pravastatin 80 MG tablet Commonly known as:  PRAVACHOL TAKE 1 TABLET BY MOUTH EVERY DAY   traMADol 50 MG tablet Commonly known  as:  ULTRAM Take 50 mg by mouth every 6 (six) hours as needed for moderate pain or severe pain.   vitamin B-12 1000 MCG tablet Commonly known as:  CYANOCOBALAMIN Take 1,000 mcg by mouth every other day.   Vitamin D3 2000 units Tabs Take 4,000 Units by mouth every morning.   zinc gluconate 50 MG tablet Take 50 mg by mouth daily.          Objective:    BP 138/84   Pulse (!) 58   Temp 98 F (36.7 C) (Oral)   Ht _0  (1.702 m)   Wt 179 lb (81.2 kg)   BMI 28.04 kg/m   Wt Readings from Last 3 Encounters:  03/06/17 179 lb (81.2 kg)  01/11/17 186 lb (84.4 kg)  01/10/17 186 lb 8 oz (84.6 kg)    Physical Exam  Constitutional: He is oriented to person, place, and time. He appears well-developed and well-nourished. No distress.  Eyes: Conjunctivae are normal. No scleral icterus.  Neck: Neck supple. No thyromegaly present.  Cardiovascular: Normal rate, regular rhythm, normal heart sounds and intact distal pulses.  No murmur heard. Pulmonary/Chest: Effort normal and breath sounds normal. No respiratory distress. He has no wheezes.  Musculoskeletal: Normal range of motion. He exhibits no edema.  Lymphadenopathy:    He has no cervical adenopathy.  Neurological: He is alert and oriented to person, place, and time. Coordination normal.  Skin: Skin is warm and dry. No rash noted. He is not diaphoretic.  Psychiatric: He has a normal mood and affect. His behavior is normal.  Nursing note and vitals reviewed.     Assessment & Plan:   Problem List Items Addressed This Visit      Cardiovascular and Mediastinum   Hypertension associated with diabetes ([place]) - Primary     Endocrine   Hyperlipidemia associated with type 2 diabetes mellitus ([place])   DM (diabetes mellitus) ([place])   Relevant Orders   Bayer DCA Hb A1c Waived       Follow up plan: Return in about 3 months (around 06/04/2017), or if symptoms worsen or fail to improve, for Recheck diabetes.  Counseling provided for all of  the vaccine components Orders Placed This Encounter  Procedures  . [facility] Hb A1c [place], MD [facility] 03/06/2017, 8:38 AM

## 2017-03-09 ENCOUNTER — Ambulatory Visit: Payer: Medicare Other | Admitting: Family Medicine

## 2017-03-09 ENCOUNTER — Telehealth: Payer: Self-pay | Admitting: Family Medicine

## 2017-03-09 MED ORDER — TRAMADOL HCL 50 MG PO TABS: 50.0000 mg | ORAL_TABLET | Freq: Two times a day (BID) | ORAL | 0 refills | Status: DC | PRN

## 2017-03-09 NOTE — Telephone Encounter (Signed)
Rx was called in, pt aware

## 2017-03-09 NOTE — Telephone Encounter (Signed)
What is the name of the medication? tramadol  Have you contacted your pharmacy to request a refill? no  Which pharmacy would you like this sent to? [facility], pt was seen on 03/06/17 and forgot to tell Dr. Warrick Parisian he needed refill on it   Patient notified that their request is being sent to the clinical staff for review and that they should receive a call once it is complete. If they do not receive a call within 24 hours they can check with their pharmacy or our office.

## 2017-03-09 NOTE — Telephone Encounter (Signed)
Will reorder this today for patient since he was just seen on 03/06/17 by PCP. Please ask patient to make sure he gets all controlled medications at his chronic follow up visits with his PCP.

## 2017-03-09 NOTE — Addendum Note (Signed)
Addended by: Evelina Dun A on: 03/09/2017 11:50 AM   Modules accepted: Orders

## 2017-03-19 ENCOUNTER — Encounter: Payer: Self-pay | Admitting: Nutrition

## 2017-03-19 ENCOUNTER — Encounter: Payer: Medicare Other | Attending: Family Medicine | Admitting: Nutrition

## 2017-03-19 VITALS — Ht 67.0 in | Wt 178.0 lb

## 2017-03-19 DIAGNOSIS — Z713 Dietary counseling and surveillance: Secondary | ICD-10-CM | POA: Insufficient documentation

## 2017-03-19 DIAGNOSIS — E1165 Type 2 diabetes mellitus with hyperglycemia: Secondary | ICD-10-CM | POA: Insufficient documentation

## 2017-03-19 DIAGNOSIS — E669 Obesity, unspecified: Secondary | ICD-10-CM

## 2017-03-19 DIAGNOSIS — E118 Type 2 diabetes mellitus with unspecified complications: Secondary | ICD-10-CM

## 2017-03-19 DIAGNOSIS — IMO0002 Reserved for concepts with insufficient information to code with codable children: Secondary | ICD-10-CM

## 2017-03-19 NOTE — Patient Instructions (Signed)
Patient Instructions  Goals Keep up the great job!!! Follow Plate Method Keep eating low carb vegetables. Drink 8 cups of water per day Don't eat past 7 pm Walk 30 minutes a day

## 2017-03-19 NOTE — Progress Notes (Addendum)
Diabetes Self-Management Education  Visit Type:    Appt. Start Time: 0900 Appt. End Time: 0930  03/19/2017  [name], identified by name and date of birth, is a 71 y.o. male with a diagnosis of Diabetes:    Has cut out a lot of snacks. Drinking more water. Has increase fresh fruits and vegetables. Lost 1 lb. Has been working on portion sizes. Stopped Glimepiride and feels a lot better. He was having low blood sugar symptoms between meals and having to eat a lot of snacks. A1C 7.4%  But his last  7-14 days avg are 140's now. Feels better. Has been reading food labels.  ASSESSMENT  Height 5\' 7"  (1.702 m), weight 178 lb (80.7 kg). Body mass index is 27.88 kg/m.  B) 1-2 eggs, grits, 1 slice toast and piece sausage, fruit coffee L) Meat, vegetables, salad, green beans, corn, water D) Pork roast sandwich,  Hominy and veggies, water  Individualized Plan for Diabetes Self-Management Training:   Learning Objective:  Patient will have a greater understanding of diabetes self-management. Patient education plan is to attend individual and/or group sessions per assessed needs and concerns.   Plan:   Patient Instructions  Goals Keep up the great job!!! Follow Plate Method Keep eating low carb vegetables. Drink 8 cups of water per day Don't eat past 7 pm Walk 30 minutes a day  Expected Outcomes:    Low blood sugars and improved knowledge.  Education material provided: Living Well with Diabetes, Food label handouts, A1C conversion sheet, Meal plan card, My Plate and Carbohydrate counting sheet  If problems or questions, patient to contact team via:  Phone and Email  Future DSME appointment:   3 months.

## 2017-03-20 ENCOUNTER — Other Ambulatory Visit: Payer: Self-pay | Admitting: Family Medicine

## 2017-03-21 ENCOUNTER — Ambulatory Visit (INDEPENDENT_AMBULATORY_CARE_PROVIDER_SITE_OTHER): Payer: Medicare Other | Admitting: *Deleted

## 2017-03-21 VITALS — BP 135/78 | HR 59 | Ht 66.0 in | Wt 178.0 lb

## 2017-03-21 DIAGNOSIS — Z23 Encounter for immunization: Secondary | ICD-10-CM

## 2017-03-21 DIAGNOSIS — Z0000 Encounter for general adult medical examination without abnormal findings: Secondary | ICD-10-CM

## 2017-03-21 NOTE — Progress Notes (Signed)
Subjective:   Tarvaris Puglia is a 71 y.o. male who presents for Medicare Annual/Subsequent preventive examination.  Mr. Lightner is accompanied by his wife.  They have been married for 33 years, and have 2 sons, 3 grandchildren, and 2 great grandchildren.  He is retired from [employer] since 1999.  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  He enjoys hunting, fishing, and riding motorcycles.  Mr. Mangano attends [employer].  He is working diligently with a dietician to lose weight and get better control of his diabetes.  He states he feels his health is better this year than it was last.  He reports no hospitalizations or emergency room visits in the past year.  He had a negative heart catheterization earlier this year, and cataract surgery.   Review of Systems:  All negative        Objective:    Vitals: BP 135/78   Pulse (!) 59   Ht _0  (1.676 m)   Wt 178 lb (80.7 kg)   BMI 28.73 kg/m   Body mass index is 28.73 kg/m.  Advanced Directives 03/21/2017 01/11/2017 01/10/2017 03/08/2016 01/01/2015  Does Patient Have a Medical Advance Directive? _1   Would patient like information on creating a medical advance directive? Yes (ED - Information included in AVS) No - Patient declined - Yes (ED - Information included in AVS) No - patient declined information    Tobacco Social History   Tobacco Use  Smoking Status Former Smoker  . Packs/day: 1.00  . Years: 8.00  . Pack years: 8.00  . Types: Cigarettes  . Last attempt to quit: 04/03/1970  . Years since quitting: 46.9  Smokeless Tobacco Never Used            Pain Score: 0-No pain                 Past Medical History:  Diagnosis Date  . Asthmatic bronchitis   . BPH (benign prostatic hypertrophy)   . Cataracts, bilateral   . Colon polyps   . Diabetes mellitus   . Diverticulosis   . Fibromyalgia   . Gout   . History of nephrectomy, unilateral    left   . Hyperlipidemia   . Hypertension   .  Single kidney    Past Surgical History:  Procedure Laterality Date  . APPENDECTOMY    . CHOLECYSTECTOMY    . COLONOSCOPY W/ POLYPECTOMY    . EYE SURGERY     catarat - right   . LEFT HEART CATH AND CORONARY ANGIOGRAPHY N/A 01/11/2017   Procedure: LEFT HEART CATH AND CORONARY ANGIOGRAPHY;  Surgeon: Martinique, Peter M, MD;  Location: [facility];  Service: Cardiovascular;  Laterality: N/A;  . NEPHRECTOMY  1996   left , secondary to nephrolithiasis    Family History  Problem Relation Age of Onset  . Coronary artery disease Unknown        family history   . Hypertension Unknown        family history   . Alzheimer's disease Mother 29  . Cancer Father        prostate  . Stroke Father   . CAD Father 53       CABG  . Hypertension Sister   . Hypercalcemia Brother   . Hypertension Brother   . Kidney Stones Son   . Breast cancer Maternal Grandmother   . Diabetes Paternal Grandmother   . Other Paternal Grandmother  Crite's disease  . Suicidality Paternal Grandfather    Social History   Socioeconomic History  . Marital status: Married    Spouse name: None  . Number of children: 2  . Years of education: None  . Highest education level: None  Social Needs  . Financial resource strain: None  . Food insecurity - worry: None  . Food insecurity - inability: None  . Transportation needs - medical: None  . Transportation needs - non-medical: None  Occupational History  . None  Tobacco Use  . Smoking status: Former Smoker    Packs/day: 1.00    Years: 8.00    Pack years: 8.00    Types: Cigarettes    Last attempt to quit: 04/03/1970    Years since quitting: 46.9  . Smokeless tobacco: Never Used  Substance and Sexual Activity  . Alcohol use: No  . Drug use: No  . Sexual activity: None  Other Topics Concern  . None  Social History Narrative   Lives with wife on a farm.      Outpatient Encounter Medications as of 03/21/2017  Medication Sig  . acetaminophen  (TYLENOL) 500 MG tablet Take 500 mg by mouth every 6 (six) hours as needed for mild pain or moderate pain.  Marland Kitchen aspirin 81 MG tablet Take 81 mg by mouth daily.  . blood glucose meter kit and supplies Dispense based on patient and insurance preference. Use up to four times daily as directed. (FOR ICD-9 250.00, 250.01).  . Cholecalciferol (VITAMIN D3) 2000 units TABS Take 4,000 Units by mouth every morning.  . famotidine (PEPCID) 20 MG tablet TAKE 1 TABLET BY MOUTH EVERY DAY  . glucose blood (ACCU-CHEK AVIVA) test strip Check BS BID and PRN dx E11.9  . irbesartan (AVAPRO) 300 MG tablet TAKE 1 TABLET BY MOUTH EVERY DAY (Patient taking differently: TAKE 300 MG BY MOUTH EVERY DAY)  . Misc Natural Products (EQL SUPER OMEGA-3) CAPS Take 4 capsules by mouth daily. 2 capsules in morning and 2 capsules in the evening  . pravastatin (PRAVACHOL) 80 MG tablet TAKE 1 TABLET BY MOUTH EVERY DAY  . Psyllium (METAMUCIL MULTIHEALTH FIBER PO) Take 2 packets by mouth daily before supper.   . traMADol (ULTRAM) 50 MG tablet Take 1 tablet (50 mg total) by mouth every 12 (twelve) hours as needed for moderate pain or severe pain.  . vitamin B-12 (CYANOCOBALAMIN) 1000 MCG tablet Take 1,000 mcg by mouth every other day.  . zinc gluconate 50 MG tablet Take 50 mg by mouth daily.    . irbesartan (AVAPRO) 300 MG tablet TAKE 1 TABLET BY MOUTH EVERY DAY  . Lancets (ACCU-CHEK SOFT TOUCH) lancets Check BS BID and PRN Dx E11.9  . magnesium gluconate (MAGONATE) 500 MG tablet Take 500 mg by mouth every other day.   . metFORMIN (GLUCOPHAGE) 500 MG tablet TAKE 1 TABLET BY MOUTH TWO TIMES DAILY WITH MEALS. Resume 01/13/17.  . [DISCONTINUED] metFORMIN (GLUCOPHAGE) 500 MG tablet TAKE 1 TABLET BY MOUTH TWO TIMES DAILY WITH MEALS   No facility-administered encounter medications on file as of 03/21/2017.     Activities of Daily Living In your present state of health, do you have any difficulty performing the following activities: 03/21/2017  01/11/2017  Hearing? Y N  Comment decreased hearing left ear occassional  -  Vision? N N  Difficulty concentrating or making decisions? N N  Walking or climbing stairs? N N  Dressing or bathing? - N  Doing errands, shopping? N -  Preparing Food and eating ? N -  Using the Toilet? N -  In the past six months, have you accidently leaked urine? N -  Do you have problems with loss of bowel control? N -  Managing your Medications? N -  Managing your Finances? N -  Housekeeping or managing your Housekeeping? N -  Some recent data might be hidden    Patient Care Team: Dettinger, Fransisca Kaufmann, MD as PCP - General (Family Medicine) Eulis Canner (Internal Medicine) Leroy Sea, MD as Referring Physician (Urology)   Dr. Minus Breeding (Cardiology) Dr. Tama High (Ophtalmology)  Assessment:   This is a routine wellness examination for Luan.  Exercise Activities and Dietary recommendations    Patient walks 3-5 days per week for at least 30 minutes.  He is seeing a dietician at [facility] who is helping with his diet and controlling his hemoglobin A1c.  He states he watches his carbohydrate intake at has 45 grams of carbs at 2 meals per day and 65 grams of carbs one meal per day.  His goal is to reach 165 lbs.  Goals    . Weight < 165 lb (74.844 kg)       Fall Risk Fall Risk  03/21/2017 03/19/2017 01/10/2017 12/08/2016 03/23/2016  Falls in the past year? _0   Number falls in past yr: - - - - -  Injury with Fall? - - - - -      Depression Screen PHQ 2/9 Scores 03/21/2017 03/19/2017 01/10/2017 12/08/2016  PHQ - 2 Score 0 0 0 0    Cognitive Function MMSE - Mini Mental State Exam 03/21/2017 03/08/2016 01/01/2015  Orientation to time _1 Orientation to Place _2 Registration _3 Attention/ Calculation _4 Recall _5 Language- name 2 objects _6 Language- repeat _7 Language- follow 3 step command _8 Language- read & follow  direction _9 Write a sentence _10 Copy design _11 Total score _12 Immunization History  Administered Date(s) Administered  . Influenza, High Dose Seasonal PF 01/15/2017  . Influenza,inj,Quad PF,6+ Mos 01/28/2013, 01/28/2014, 01/11/2015  . Pneumococcal Conjugate-13 01/28/2013  . Pneumococcal Polysaccharide-23 01/01/2015  . Zoster 01/09/2013  . Zoster Recombinat (Shingrix) 03/21/2017    Qualifies for Shingles Vaccine? Yes- given today  Screening Tests Health Maintenance  Topic Date Due  . OPHTHALMOLOGY EXAM  04/19/2016  . FOOT EXAM  03/23/2017  . HEMOGLOBIN A1C  09/04/2017  . TETANUS/TDAP  02/02/2019  . COLONOSCOPY  06/08/2019  . INFLUENZA VACCINE  Completed  . Hepatitis C Screening  Completed  . PNA vac Low Risk Adult  Completed   Patient sees Dr. Tama High ophthalmologist- will request records for last diabetic eye exam.   Recommend foot exam at next vitis with Dr. Warrick Parisian     Plan:     Continue healthy diet and exercise regimen.  Review information given on Advanced Directives.  If completed please bring our office a copy to file in your chart.  Return for 2nd Shingrix vaccine after 05/22/17     I have personally reviewed and noted the following in the patient's chart:   . Medical and social history . Use of alcohol, tobacco or illicit drugs  . Current medications and supplements . Functional ability  and status . Nutritional status . Physical activity . Advanced directives . List of other physicians . Hospitalizations, surgeries, and ER visits in previous 12 months . Vitals . Screenings to include cognitive, depression, and falls . Referrals and appointments  In addition, I have reviewed and discussed with patient certain preventive protocols, quality metrics, and best practice recommendations. A written personalized care plan for preventive services as well as general preventive health recommendations were provided to  patient.     Denyce Robert, RN  03/21/2017

## 2017-03-21 NOTE — Patient Instructions (Signed)
Please continue your healthy diet and exercise regimen.  Please review information given on Advanced Directives, and if you complete these please bring our office a copy to file in your chart.  You received your 1st Shingrix vaccine today, you will need the 2nd vaccine dose after 05/22/17.   Thank you for coming in for your Annual Wellness Visit today, Jeffrey Rubio!!   Preventive Care 71 Years and Older, Male Preventive care refers to lifestyle choices and visits with your health care provider that can promote health and wellness. What does preventive care include?  A yearly physical exam. This is also called an annual well check.  Dental exams once or twice a year.  Routine eye exams. Ask your health care provider how often you should have your eyes checked.  Personal lifestyle choices, including: ? Daily care of your teeth and gums. ? Regular physical activity. ? Eating a healthy diet. ? Avoiding tobacco and drug use. ? Limiting alcohol use. ? Practicing safe sex. ? Taking low doses of aspirin every day. ? Taking vitamin and mineral supplements as recommended by your health care provider. What happens during an annual well check? The services and screenings done by your health care provider during your annual well check will depend on your age, overall health, lifestyle risk factors, and family history of disease. Counseling Your health care provider may ask you questions about your:  Alcohol use.  Tobacco use.  Drug use.  Emotional well-being.  Home and relationship well-being.  Sexual activity.  Eating habits.  History of falls.  Memory and ability to understand (cognition).  Work and work Statistician.  Screening You may have the following tests or measurements:  Height, weight, and BMI.  Blood pressure.  Lipid and cholesterol levels. These may be checked every 5 years, or more frequently if you are over 71 years old.  Skin check.  Lung cancer  screening. You may have this screening every year starting at age 71 if you have a 30-pack-year history of smoking and currently smoke or have quit within the past 15 years.  Fecal occult blood test (FOBT) of the stool. You may have this test every year starting at age 71.  Flexible sigmoidoscopy or colonoscopy. You may have a sigmoidoscopy every 5 years or a colonoscopy every 10 years starting at age 71.  Prostate cancer screening. Recommendations will vary depending on your family history and other risks.  Hepatitis C blood test.  Hepatitis B blood test.  Sexually transmitted disease (STD) testing.  Diabetes screening. This is done by checking your blood sugar (glucose) after you have not eaten for a while (fasting). You may have this done every 1-3 years.  Abdominal aortic aneurysm (AAA) screening. You may need this if you are a current or former smoker.  Osteoporosis. You may be screened starting at age 60 if you are at high risk.  Talk with your health care provider about your test results, treatment options, and if necessary, the need for more tests. Vaccines Your health care provider may recommend certain vaccines, such as:  Influenza vaccine. This is recommended every year.  Tetanus, diphtheria, and acellular pertussis (Tdap, Td) vaccine. You may need a Td booster every 10 years.  Varicella vaccine. You may need this if you have not been vaccinated.  Zoster vaccine. You may need this after age 21.  Measles, mumps, and rubella (MMR) vaccine. You may need at least one dose of MMR if you were born in 1957 or later. You  may also need a second dose.  Pneumococcal 13-valent conjugate (PCV13) vaccine. One dose is recommended after age 43.  Pneumococcal polysaccharide (PPSV23) vaccine. One dose is recommended after age 44.  Meningococcal vaccine. You may need this if you have certain conditions.  Hepatitis A vaccine. You may need this if you have certain conditions or if you  travel or work in places where you may be exposed to hepatitis A.  Hepatitis B vaccine. You may need this if you have certain conditions or if you travel or work in places where you may be exposed to hepatitis B.  Haemophilus influenzae type b (Hib) vaccine. You may need this if you have certain risk factors.  Talk to your health care provider about which screenings and vaccines you need and how often you need them. This information is not intended to replace advice given to you by your health care provider. Make sure you discuss any questions you have with your health care provider. Document Released: 04/16/2015 Document Revised: 12/08/2015 Document Reviewed: 01/19/2015 Elsevier Interactive Patient Education  [employer].

## 2017-05-18 ENCOUNTER — Other Ambulatory Visit: Payer: Self-pay | Admitting: Family Medicine

## 2017-05-18 DIAGNOSIS — I10 Essential (primary) hypertension: Secondary | ICD-10-CM

## 2017-06-06 ENCOUNTER — Ambulatory Visit (INDEPENDENT_AMBULATORY_CARE_PROVIDER_SITE_OTHER): Payer: Medicare Other | Admitting: Family Medicine

## 2017-06-06 ENCOUNTER — Encounter: Payer: Self-pay | Admitting: Family Medicine

## 2017-06-06 ENCOUNTER — Other Ambulatory Visit: Payer: Self-pay | Admitting: Family Medicine

## 2017-06-06 VITALS — BP 145/80 | HR 58 | Ht 66.0 in | Wt 176.0 lb

## 2017-06-06 DIAGNOSIS — E119 Type 2 diabetes mellitus without complications: Secondary | ICD-10-CM | POA: Diagnosis not present

## 2017-06-06 DIAGNOSIS — E785 Hyperlipidemia, unspecified: Secondary | ICD-10-CM | POA: Diagnosis not present

## 2017-06-06 DIAGNOSIS — E1169 Type 2 diabetes mellitus with other specified complication: Secondary | ICD-10-CM

## 2017-06-06 DIAGNOSIS — I10 Essential (primary) hypertension: Secondary | ICD-10-CM

## 2017-06-06 DIAGNOSIS — E1159 Type 2 diabetes mellitus with other circulatory complications: Secondary | ICD-10-CM

## 2017-06-06 LAB — CMP14+EGFR
ALBUMIN: 4.4 g/dL (ref 3.5–4.8)
ALK PHOS: 85 IU/L (ref 39–117)
ALT: 12 IU/L (ref 0–44)
AST: 11 IU/L (ref 0–40)
Albumin/Globulin Ratio: 1.7 (ref 1.2–2.2)
BILIRUBIN TOTAL: 0.4 mg/dL (ref 0.0–1.2)
BUN / CREAT RATIO: 18 (ref 10–24)
BUN: 21 mg/dL (ref 8–27)
CHLORIDE: 103 mmol/L (ref 96–106)
CO2: 22 mmol/L (ref 20–29)
CREATININE: 1.16 mg/dL (ref 0.76–1.27)
Calcium: 9.6 mg/dL (ref 8.6–10.2)
GFR calc Af Amer: 73 mL/min/{1.73_m2} (ref >59)
GFR calc non Af Amer: 63 mL/min/{1.73_m2} (ref >59)
GLUCOSE: 180 mg/dL — AB (ref 65–99)
Globulin, Total: 2.6 g/dL (ref 1.5–4.5)
Potassium: 5 mmol/L (ref 3.5–5.2)
Sodium: 142 mmol/L (ref 134–144)
Total Protein: 7 g/dL (ref 6.0–8.5)

## 2017-06-06 LAB — LIPID PANEL
CHOLESTEROL TOTAL: 155 mg/dL (ref 100–199)
Chol/HDL Ratio: 3.2 ratio (ref 0.0–5.0)
HDL: 48 mg/dL (ref >39)
LDL CALC: 59 mg/dL (ref 0–99)
Triglycerides: 238 mg/dL — ABNORMAL HIGH (ref 0–149)
VLDL CHOLESTEROL CAL: 48 mg/dL — AB (ref 5–40)

## 2017-06-06 LAB — BAYER DCA HB A1C WAIVED: HB A1C (BAYER DCA - WAIVED): 7.2 % — ABNORMAL HIGH (ref <7.0)

## 2017-06-06 MED ORDER — AMLODIPINE BESYLATE 5 MG PO TABS: 5.0000 mg | ORAL_TABLET | Freq: Every day | ORAL | 3 refills | Status: DC

## 2017-06-06 MED ORDER — FREESTYLE LIBRE 14 DAY READER DEVI: 1.0000 | 0 refills | Status: DC

## 2017-06-06 MED ORDER — FREESTYLE LIBRE 14 DAY SENSOR MISC: 1.0000 | 11 refills | Status: DC

## 2017-06-06 NOTE — Progress Notes (Signed)
BP (!) 145/80   Pulse (!) 58   Ht '5\' 6"'  (1.676 m)   Wt 176 lb (79.8 kg)   BMI 28.41 kg/m    Subjective:    Patient ID: Jeffrey Rubio, male    DOB: December 27, 1945, 72 y.o.   MRN: [id number]  HPI: Jeffrey Rubio is a 72 y.o. male presenting on 06/06/2017 for Diabetes; Hyperlipidemia; and Hypertension (discuss Irbesartan, under recall)   HPI Type 2 diabetes mellitus Patient comes in today for recheck of his diabetes. Patient has been currently taking metformin. Patient is currently on an ACE inhibitor/ARB. Patient has not seen an ophthalmologist this year. Patient denies any issues with their feet.   Hypertension Patient is currently on amlodipine and a irbesartan, and their blood pressure today is 145/80. Patient denies any lightheadedness or dizziness. Patient denies headaches, blurred vision, chest pains, shortness of breath, or weakness. Denies any side effects from medication and is content with current medication.   Hyperlipidemia Patient is coming in for recheck of his hyperlipidemia. The patient is currently taking pravastatin. They deny any issues with myalgias or history of liver damage from it. They deny any focal numbness or weakness or chest pain.   Relevant past medical, surgical, family and social history reviewed and updated as indicated. Interim medical history since our last visit reviewed. Allergies and medications reviewed and updated.  Review of Systems  Constitutional: Negative for chills and fever.  Respiratory: Negative for shortness of breath and wheezing.   Cardiovascular: Negative for chest pain and leg swelling.  Musculoskeletal: Negative for back pain and gait problem.  Skin: Negative for rash.  Neurological: Negative for dizziness, weakness, light-headedness and numbness.  All other systems reviewed and are negative.   Per HPI unless specifically indicated above   Allergies as of 06/06/2017      Reactions   Viibryd [vilazodone Hcl]         Medication List        Accurate as of 06/06/17  9:13 AM. Always use your most recent med list.          accu-chek soft touch lancets Check BS BID and PRN Dx E11.9   acetaminophen 500 MG tablet Commonly known as:  TYLENOL Take 500 mg by mouth every 6 (six) hours as needed for mild pain or moderate pain.   amLODipine 5 MG tablet Commonly known as:  NORVASC Take 1 tablet (5 mg total) by mouth daily.   aspirin 81 MG tablet Take 81 mg by mouth daily.   blood glucose meter kit and supplies Dispense based on patient and insurance preference. Use up to four times daily as directed. (FOR ICD-9 250.00, 250.01).   EQL SUPER OMEGA-3 Caps Take 4 capsules by mouth daily. 2 capsules in morning and 2 capsules in the evening   famotidine 20 MG tablet Commonly known as:  PEPCID TAKE 1 TABLET BY MOUTH EVERY DAY   FREESTYLE LIBRE 14 DAY READER Devi 1 each by Does not apply route every 14 (fourteen) days.   FREESTYLE LIBRE 14 DAY SENSOR Misc 1 each by Does not apply route every 14 (fourteen) days.   glucose blood test strip Commonly known as:  ACCU-CHEK AVIVA Check BS BID and PRN dx E11.9   irbesartan 300 MG tablet Commonly known as:  AVAPRO TAKE 1 TABLET BY MOUTH EVERY DAY   magnesium gluconate 500 MG tablet Commonly known as:  MAGONATE Take 500 mg by mouth every other day.   METAMUCIL MULTIHEALTH FIBER  PO Take 2 packets by mouth daily before supper.   metFORMIN 500 MG tablet Commonly known as:  GLUCOPHAGE TAKE 1 TABLET BY MOUTH TWO TIMES DAILY WITH MEALS. Resume 01/13/17.   pravastatin 80 MG tablet Commonly known as:  PRAVACHOL TAKE 1 TABLET BY MOUTH EVERY DAY   traMADol 50 MG tablet Commonly known as:  ULTRAM Take 1 tablet (50 mg total) by mouth every 12 (twelve) hours as needed for moderate pain or severe pain.   vitamin B-12 1000 MCG tablet Commonly known as:  CYANOCOBALAMIN Take 1,000 mcg by mouth every other day.   Vitamin D3 2000 units Tabs Take 4,000 Units by  mouth every morning.   zinc gluconate 50 MG tablet Take 50 mg by mouth daily.          Objective:    BP (!) 145/80   Pulse (!) 58   Ht '5\' 6"'  (1.676 m)   Wt 176 lb (79.8 kg)   BMI 28.41 kg/m   Wt Readings from Last 3 Encounters:  06/06/17 176 lb (79.8 kg)  03/21/17 178 lb (80.7 kg)  03/19/17 178 lb (80.7 kg)    Physical Exam  Constitutional: He is oriented to person, place, and time. He appears well-developed and well-nourished. No distress.  Eyes: Conjunctivae are normal. No scleral icterus.  Neck: Neck supple. No thyromegaly present.  Cardiovascular: Normal rate, regular rhythm, normal heart sounds and intact distal pulses.  No murmur heard. Pulmonary/Chest: Effort normal and breath sounds normal. No respiratory distress. He has no wheezes.  Musculoskeletal: Normal range of motion. He exhibits no edema.  Lymphadenopathy:    He has no cervical adenopathy.  Neurological: He is alert and oriented to person, place, and time. Coordination normal.  Skin: Skin is warm and dry. No rash noted. He is not diaphoretic.  Psychiatric: He has a normal mood and affect. His behavior is normal.  Nursing note and vitals reviewed.   Diabetic Foot Exam - Simple   Simple Foot Form Diabetic Foot exam was performed with the following findings:  Yes 06/06/2017  9:14 AM  Visual Inspection No deformities, no ulcerations, no other skin breakdown bilaterally:  Yes Sensation Testing Intact to touch and monofilament testing bilaterally:  Yes Pulse Check Posterior Tibialis and Dorsalis pulse intact bilaterally:  Yes Comments        Assessment & Plan:   Problem List Items Addressed This Visit      Cardiovascular and Mediastinum   Hypertension associated with diabetes ([place])   Relevant Medications   amLODipine (NORVASC) 5 MG tablet   Other Relevant Orders   CMP14+EGFR (Completed)     Endocrine   Hyperlipidemia associated with type 2 diabetes mellitus ([place]) - Primary   Relevant Medications    amLODipine (NORVASC) 5 MG tablet   Other Relevant Orders   Lipid panel (Completed)   DM (diabetes mellitus) ([place])   Relevant Medications   Continuous Blood Gluc Receiver (FREESTYLE LIBRE 14 DAY READER) DEVI   Continuous Blood Gluc Sensor (FREESTYLE LIBRE 14 DAY SENSOR) MISC   Other Relevant Orders   Bayer DCA Hb A1c Waived (Completed)   CMP14+EGFR (Completed)       Follow up plan: Return in about 3 months (around 09/06/2017), or if symptoms worsen or fail to improve, for Recheck diabetes.  Counseling provided for all of the vaccine components Orders Placed This Encounter  Procedures  . Bayer DCA Hb A1c Waived  . CMP14+EGFR  . Lipid panel    Caryl Pina, MD Tristan Schroeder  [place] 06/06/2017, 9:13 AM

## 2017-06-07 ENCOUNTER — Other Ambulatory Visit: Payer: Self-pay | Admitting: *Deleted

## 2017-06-07 ENCOUNTER — Ambulatory Visit: Payer: Medicare Other | Admitting: Family Medicine

## 2017-06-07 MED ORDER — ACCU-CHEK SOFT TOUCH LANCETS MISC: 11 refills | Status: DC

## 2017-06-07 MED ORDER — GLUCOSE BLOOD VI STRP: ORAL_STRIP | 11 refills | Status: DC

## 2017-06-07 MED ORDER — ACCU-CHEK AVIVA PLUS W/DEVICE KIT: PACK | 0 refills | Status: AC

## 2017-06-07 NOTE — Telephone Encounter (Signed)
Fax received [facility] Ins does not cover [employer] Rx for [employer] sent to pharmacy Rx set up to test daily due to [facility] guidelines for non insulin diabetics

## 2017-06-21 ENCOUNTER — Other Ambulatory Visit: Payer: Self-pay | Admitting: Family Medicine

## 2017-08-17 DIAGNOSIS — E113211 Type 2 diabetes mellitus with mild nonproliferative diabetic retinopathy with macular edema, right eye: Secondary | ICD-10-CM | POA: Diagnosis not present

## 2017-09-06 ENCOUNTER — Encounter: Payer: Self-pay | Admitting: Family Medicine

## 2017-09-06 ENCOUNTER — Ambulatory Visit: Payer: Medicare Other | Admitting: Family Medicine

## 2017-09-06 VITALS — BP 126/73 | HR 59 | Temp 97.8°F | Ht 66.0 in | Wt 172.0 lb

## 2017-09-06 DIAGNOSIS — E119 Type 2 diabetes mellitus without complications: Secondary | ICD-10-CM

## 2017-09-06 DIAGNOSIS — E1169 Type 2 diabetes mellitus with other specified complication: Secondary | ICD-10-CM | POA: Diagnosis not present

## 2017-09-06 DIAGNOSIS — I10 Essential (primary) hypertension: Secondary | ICD-10-CM

## 2017-09-06 DIAGNOSIS — I152 Hypertension secondary to endocrine disorders: Secondary | ICD-10-CM

## 2017-09-06 DIAGNOSIS — Z9189 Other specified personal risk factors, not elsewhere classified: Secondary | ICD-10-CM | POA: Diagnosis not present

## 2017-09-06 DIAGNOSIS — E785 Hyperlipidemia, unspecified: Secondary | ICD-10-CM

## 2017-09-06 DIAGNOSIS — N400 Enlarged prostate without lower urinary tract symptoms: Secondary | ICD-10-CM

## 2017-09-06 DIAGNOSIS — E1159 Type 2 diabetes mellitus with other circulatory complications: Secondary | ICD-10-CM | POA: Diagnosis not present

## 2017-09-06 DIAGNOSIS — Z23 Encounter for immunization: Secondary | ICD-10-CM

## 2017-09-06 LAB — BAYER DCA HB A1C WAIVED: HB A1C (BAYER DCA - WAIVED): 7.2 % — ABNORMAL HIGH (ref <7.0)

## 2017-09-06 MED ORDER — TRAMADOL HCL 50 MG PO TABS: 50.0000 mg | ORAL_TABLET | Freq: Two times a day (BID) | ORAL | 2 refills | Status: DC | PRN

## 2017-09-06 MED ORDER — AMLODIPINE BESYLATE 5 MG PO TABS: 5.0000 mg | ORAL_TABLET | Freq: Every day | ORAL | 3 refills | Status: DC

## 2017-09-06 MED ORDER — PRAVASTATIN SODIUM 20 MG PO TABS: 20.0000 mg | ORAL_TABLET | Freq: Every day | ORAL | 3 refills | Status: DC

## 2017-09-06 MED ORDER — IRBESARTAN 300 MG PO TABS: 300.0000 mg | ORAL_TABLET | Freq: Every day | ORAL | 3 refills | Status: DC

## 2017-09-06 MED ORDER — METFORMIN HCL 500 MG PO TABS: ORAL_TABLET | ORAL | 3 refills | Status: DC

## 2017-09-06 NOTE — Patient Instructions (Signed)
The 10-year ASCVD risk score Mikey Bussing DC Brooke Bonito., et al., 2013) is: 34.6%   Values used to calculate the score:     Age: 72 years     Sex: Male     Is Non-Hispanic African American: No     Diabetic: Yes     Tobacco smoker: No     Systolic Blood Pressure: 494 mmHg     Is BP treated: Yes     HDL Cholesterol: 48 mg/dL     Total Cholesterol: 155 mg/dL

## 2017-09-06 NOTE — Progress Notes (Signed)
BP 126/73   Pulse (!) 59   Temp 97.8 F (36.6 C) (Oral)   Ht 5' 6" (1.676 m)   Wt 172 lb (78 kg)   BMI 27.76 kg/m    Subjective:    Patient ID: Jeffrey Rubio, male    DOB: 08/10/1945, 72 y.o.   MRN: [id number]  HPI: Jeffrey Rubio is a 72 y.o. male presenting on 09/06/2017 for Diabetes; Hyperlipidemia (patient has discontinued Pravastatin, did some research and read that it could elevate blood sugars, since stopping blood sugars have improved and he feels much better); and Hypertension   HPI Hypertension Patient is currently on amlodipine and irbesartan, and their blood pressure today is 126/73. Patient denies any lightheadedness or dizziness. Patient denies headaches, blurred vision, chest pains, shortness of breath, or weakness. Denies any side effects from medication and is content with current medication.   Hyperlipidemia Patient is coming in for recheck of his hyperlipidemia. The patient is currently taking nothing currently, had stopped pravastatin because of myalgias, will try lower dose and see how he does. They deny any issues with myalgias or history of liver damage from it. They deny any focal numbness or weakness or chest pain.   Type 2 diabetes mellitus Patient comes in today for recheck of his diabetes. Patient has been currently taking metformin, he says his blood sugars were running better off the pravastatin and said that it was really fluctuating his blood sugars previously.. Patient is currently on an ACE inhibitor/ARB. Patient has seen an ophthalmologist this year we do not have the records but he said he just did it 1 week ago. Patient denies any issues with their feet.   BPH Patient has a history of BPH, he denies any major symptoms from it currently.  He has not seen his urologist in some time and wants to get the blood work for it here  Relevant past medical, surgical, family and social history reviewed and updated as indicated. Interim medical history  since our last visit reviewed. Allergies and medications reviewed and updated.  Review of Systems  Constitutional: Negative for chills and fever.  Respiratory: Negative for shortness of breath and wheezing.   Cardiovascular: Negative for chest pain and leg swelling.  Musculoskeletal: Negative for back pain and gait problem.  Skin: Negative for rash.  Neurological: Negative for dizziness, weakness and light-headedness.  All other systems reviewed and are negative.   Per HPI unless specifically indicated above   Allergies as of 09/06/2017      Reactions   Viibryd [vilazodone Hcl]       Medication List        Accurate as of 09/06/17  9:31 AM. Always use your most recent med list.          ACCU-CHEK AVIVA PLUS w/Device Kit USE to check blood sugars daily   accu-chek soft touch lancets Check BS daily   acetaminophen 500 MG tablet Commonly known as:  TYLENOL Take 500 mg by mouth every 6 (six) hours as needed for mild pain or moderate pain.   amLODipine 5 MG tablet Commonly known as:  NORVASC Take 1 tablet (5 mg total) by mouth daily.   aspirin 81 MG tablet Take 81 mg by mouth daily.   EQL SUPER OMEGA-3 Caps Take 4 capsules by mouth daily. 2 capsules in morning and 2 capsules in the evening   famotidine 20 MG tablet Commonly known as:  PEPCID TAKE 1 TABLET BY MOUTH EVERY DAY   FREESTYLE  LIBRE 14 DAY READER Devi 1 each by Does not apply route every 14 (fourteen) days.   FREESTYLE LIBRE 14 DAY SENSOR Misc 1 each by Does not apply route every 14 (fourteen) days.   glucose blood test strip Commonly known as:  ACCU-CHEK AVIVA Check BS daily   irbesartan 300 MG tablet Commonly known as:  AVAPRO TAKE 1 TABLET BY MOUTH EVERY DAY   magnesium gluconate 500 MG tablet Commonly known as:  MAGONATE Take 500 mg by mouth every other day.   METAMUCIL MULTIHEALTH FIBER PO Take 2 packets by mouth daily before supper.   metFORMIN 500 MG tablet Commonly known as:   GLUCOPHAGE TAKE 1 TABLET BY MOUTH TWO TIMES DAILY WITH MEALS. Resume 01/13/17.   metFORMIN 500 MG tablet Commonly known as:  GLUCOPHAGE TAKE 1 TABLET BY MOUTH TWO TIMES DAILY WITH MEALS   traMADol 50 MG tablet Commonly known as:  ULTRAM Take 1 tablet (50 mg total) by mouth every 12 (twelve) hours as needed for moderate pain or severe pain.   vitamin B-12 1000 MCG tablet Commonly known as:  CYANOCOBALAMIN Take 1,000 mcg by mouth every other day.   Vitamin D3 2000 units Tabs Take 4,000 Units by mouth every morning.   zinc gluconate 50 MG tablet Take 50 mg by mouth daily.          Objective:    BP 126/73   Pulse (!) 59   Temp 97.8 F (36.6 C) (Oral)   Ht 5' 6" (1.676 m)   Wt 172 lb (78 kg)   BMI 27.76 kg/m   Wt Readings from Last 3 Encounters:  09/06/17 172 lb (78 kg)  06/06/17 176 lb (79.8 kg)  03/21/17 178 lb (80.7 kg)    Physical Exam  Constitutional: He is oriented to person, place, and time. He appears well-developed and well-nourished. No distress.  Eyes: Conjunctivae are normal. No scleral icterus.  Neck: Neck supple. No thyromegaly present.  Cardiovascular: Normal rate, regular rhythm, normal heart sounds and intact distal pulses.  No murmur heard. Pulmonary/Chest: Effort normal and breath sounds normal. No respiratory distress. He has no wheezes.  Musculoskeletal: Normal range of motion. He exhibits no edema.  Lymphadenopathy:    He has no cervical adenopathy.  Neurological: He is alert and oriented to person, place, and time. Coordination normal.  Skin: Skin is warm and dry. No rash noted. He is not diaphoretic.  Psychiatric: He has a normal mood and affect. His behavior is normal.  Nursing note and vitals reviewed.   Results for orders placed or performed in visit on 06/06/17  Bayer DCA Hb A1c Waived  Result Value Ref Range   HB A1C (BAYER DCA - WAIVED) 7.2 (H) <7.0 %  CMP14+EGFR  Result Value Ref Range   Glucose 180 (H) 65 - 99 mg/dL   BUN 21 8  - 27 mg/dL   Creatinine, Ser 1.16 0.76 - 1.27 mg/dL   GFR calc non Af Amer 63 >59 mL/min/1.73   GFR calc Af Amer 73 >59 mL/min/1.73   BUN/Creatinine Ratio 18 10 - 24   Sodium 142 134 - 144 mmol/L   Potassium 5.0 3.5 - 5.2 mmol/L   Chloride 103 96 - 106 mmol/L   CO2 22 20 - 29 mmol/L   Calcium 9.6 8.6 - 10.2 mg/dL   Total Protein 7.0 6.0 - 8.5 g/dL   Albumin 4.4 3.5 - 4.8 g/dL   Globulin, Total 2.6 1.5 - 4.5 g/dL   Albumin/Globulin Ratio 1.7  1.2 - 2.2   Bilirubin Total 0.4 0.0 - 1.2 mg/dL   Alkaline Phosphatase 85 39 - 117 IU/L   AST 11 0 - 40 IU/L   ALT 12 0 - 44 IU/L  Lipid panel  Result Value Ref Range   Cholesterol, Total 155 100 - 199 mg/dL   Triglycerides 238 (H) 0 - 149 mg/dL   HDL 48 >39 mg/dL   VLDL Cholesterol Cal 48 (H) 5 - 40 mg/dL   LDL Calculated 59 0 - 99 mg/dL   Chol/HDL Ratio 3.2 0.0 - 5.0 ratio      Assessment & Plan:   Problem List Items Addressed This Visit      Cardiovascular and Mediastinum   Hypertension associated with diabetes ([place]) - Primary   Relevant Medications   pravastatin (PRAVACHOL) 20 MG tablet   metFORMIN (GLUCOPHAGE) 500 MG tablet   amLODipine (NORVASC) 5 MG tablet   irbesartan (AVAPRO) 300 MG tablet   Other Relevant Orders   CMP14+EGFR   CBC with Differential/Platelet     Endocrine   Hyperlipidemia associated with type 2 diabetes mellitus ([facility])   Relevant Medications   pravastatin (PRAVACHOL) 20 MG tablet   metFORMIN (GLUCOPHAGE) 500 MG tablet   amLODipine (NORVASC) 5 MG tablet   irbesartan (AVAPRO) 300 MG tablet   Other Relevant Orders   Lipid panel   DM (diabetes mellitus) ([facility])   Relevant Medications   pravastatin (PRAVACHOL) 20 MG tablet   metFORMIN (GLUCOPHAGE) 500 MG tablet   irbesartan (AVAPRO) 300 MG tablet   Other Relevant Orders   Bayer DCA Hb A1c Waived   CMP14+EGFR   CBC with Differential/Platelet     Genitourinary   Benign prostatic hyperplasia   Relevant Orders   PSA, total and free   CBC with  Differential/Platelet    Other Visit Diagnoses    Atherosclerotic cardiovascular disease 10 year risk greater than 30% using 2013 ACC/AHA guidelines       Relevant Orders   CBC with Differential/Platelet       The 10-year ASCVD risk score Mikey Bussing DC Jr., et al., 2013) is: 34.6%   Values used to calculate the score:     Age: 19 years     Sex: Male     Is Non-Hispanic African American: No     Diabetic: Yes     Tobacco smoker: No     Systolic Blood Pressure: 629 mmHg     Is BP treated: Yes     HDL Cholesterol: 48 mg/dL     Total Cholesterol: 155 mg/dL   Discussed cardiac risk with patient and he is agreeable to trying a low-dose statin and seeing if it goes better for him. Follow up plan: Return in about 3 months (around 12/07/2017), or if symptoms worsen or fail to improve, for Diabetes and hypertension.  Counseling provided for all of the vaccine components Orders Placed This Encounter  Procedures  . Bayer DCA Hb A1c Waived  . CMP14+EGFR  . Lipid panel  . PSA, total and free  . CBC with Differential/Platelet    Caryl Pina, MD [facility] 09/06/2017, 9:30 AM

## 2017-09-06 NOTE — Addendum Note (Signed)
Addended by: Michaela Corner on: 09/06/2017 11:03 AM   Modules accepted: Orders

## 2017-09-07 LAB — CBC WITH DIFFERENTIAL/PLATELET
BASOS ABS: 0 10*3/uL (ref 0.0–0.2)
Basos: 0 %
EOS (ABSOLUTE): 0.1 10*3/uL (ref 0.0–0.4)
Eos: 1 %
HEMOGLOBIN: 13.8 g/dL (ref 13.0–17.7)
Hematocrit: 41.1 % (ref 37.5–51.0)
IMMATURE GRANS (ABS): 0 10*3/uL (ref 0.0–0.1)
IMMATURE GRANULOCYTES: 0 %
LYMPHS: 32 %
Lymphocytes Absolute: 2.2 10*3/uL (ref 0.7–3.1)
MCH: 30.2 pg (ref 26.6–33.0)
MCHC: 33.6 g/dL (ref 31.5–35.7)
MCV: 90 fL (ref 79–97)
MONOCYTES: 10 %
Monocytes Absolute: 0.7 10*3/uL (ref 0.1–0.9)
NEUTROS ABS: 3.9 10*3/uL (ref 1.4–7.0)
NEUTROS PCT: 57 %
PLATELETS: 251 10*3/uL (ref 150–450)
RBC: 4.57 x10E6/uL (ref 4.14–5.80)
RDW: 14.2 % (ref 12.3–15.4)
WBC: 6.8 10*3/uL (ref 3.4–10.8)

## 2017-09-07 LAB — CMP14+EGFR
ALK PHOS: 77 IU/L (ref 39–117)
ALT: 14 IU/L (ref 0–44)
AST: 11 IU/L (ref 0–40)
Albumin/Globulin Ratio: 2 (ref 1.2–2.2)
Albumin: 4.3 g/dL (ref 3.5–4.8)
BILIRUBIN TOTAL: 0.3 mg/dL (ref 0.0–1.2)
BUN / CREAT RATIO: 18 (ref 10–24)
BUN: 21 mg/dL (ref 8–27)
CHLORIDE: 103 mmol/L (ref 96–106)
CO2: 24 mmol/L (ref 20–29)
CREATININE: 1.16 mg/dL (ref 0.76–1.27)
Calcium: 9.5 mg/dL (ref 8.6–10.2)
GFR calc Af Amer: 73 mL/min/{1.73_m2} (ref >59)
GFR calc non Af Amer: 63 mL/min/{1.73_m2} (ref >59)
GLOBULIN, TOTAL: 2.2 g/dL (ref 1.5–4.5)
GLUCOSE: 149 mg/dL — AB (ref 65–99)
Potassium: 5.1 mmol/L (ref 3.5–5.2)
SODIUM: 140 mmol/L (ref 134–144)
Total Protein: 6.5 g/dL (ref 6.0–8.5)

## 2017-09-07 LAB — PSA, TOTAL AND FREE
PSA FREE PCT: 23.3 %
PSA FREE: 0.14 ng/mL
Prostate Specific Ag, Serum: 0.6 ng/mL (ref 0.0–4.0)

## 2017-09-07 LAB — LIPID PANEL
CHOLESTEROL TOTAL: 194 mg/dL (ref 100–199)
Chol/HDL Ratio: 4.1 ratio (ref 0.0–5.0)
HDL: 47 mg/dL (ref >39)
LDL CALC: 102 mg/dL — AB (ref 0–99)
TRIGLYCERIDES: 225 mg/dL — AB (ref 0–149)
VLDL Cholesterol Cal: 45 mg/dL — ABNORMAL HIGH (ref 5–40)

## 2017-09-17 ENCOUNTER — Encounter: Payer: Self-pay | Admitting: Nutrition

## 2017-09-17 ENCOUNTER — Encounter: Payer: Medicare Other | Attending: Family Medicine | Admitting: Nutrition

## 2017-09-17 VITALS — Wt 171.0 lb

## 2017-09-17 DIAGNOSIS — Z7984 Long term (current) use of oral hypoglycemic drugs: Secondary | ICD-10-CM | POA: Diagnosis not present

## 2017-09-17 DIAGNOSIS — E118 Type 2 diabetes mellitus with unspecified complications: Secondary | ICD-10-CM

## 2017-09-17 DIAGNOSIS — IMO0002 Reserved for concepts with insufficient information to code with codable children: Secondary | ICD-10-CM

## 2017-09-17 DIAGNOSIS — Z6827 Body mass index (BMI) 27.0-27.9, adult: Secondary | ICD-10-CM | POA: Insufficient documentation

## 2017-09-17 DIAGNOSIS — Z713 Dietary counseling and surveillance: Secondary | ICD-10-CM | POA: Insufficient documentation

## 2017-09-17 DIAGNOSIS — E1165 Type 2 diabetes mellitus with hyperglycemia: Secondary | ICD-10-CM

## 2017-09-17 NOTE — Patient Instructions (Addendum)
Goals  Keep up the great job! Continue to increase high fiber foods. Increase water intake to a gallon a day, especially on hot days [place] fruit and nuts for snack if needed before dinner if in the field or on tractor.  Ask MD If you could take omega 3 fishi oils to help lower TG.

## 2017-09-17 NOTE — Progress Notes (Signed)
Diabetes Self-Management Education  Visit Type: Follow-up  Appt. Start Time: 0900 Appt. End Time: 0930 09/17/2017  Jeffrey Rubio, identified by name and date of birth, is a 72 y.o. male with a diagnosis of Diabetes:  .  Has cut out a lot of snacks. Drinking more water. Has increase fresh fruits and vegetables. Lost 5 lbs. Has been working on portion sizes. Metformin 500 mg BID. Has reduced his cholesterol medication due to chronic pain from medications. Talked with MD about it and has reduced to 20 mg every other day. A1C  7.2%.  ASSESSMENT  Weight 171 lb (77.6 kg). Body mass index is 27.6 kg/m.  Diabetes Self-Management Education - 09/17/17 1428      Visit Information   Visit Type  Follow-up      Health Coping   How would you rate your overall health?  Good      Psychosocial Assessment   Patient Belief/Attitude about Diabetes  Motivated to manage diabetes      Pre-Education Assessment   Patient understands the diabetes disease and treatment process.  Demonstrates understanding / competency    Patient understands incorporating nutritional management into lifestyle.  Demonstrates understanding / competency    Patient undertands incorporating physical activity into lifestyle.  Demonstrates understanding / competency    Patient understands using medications safely.  Demonstrates understanding / competency    Patient understands monitoring blood glucose, interpreting and using results  Demonstrates understanding / competency    Patient understands prevention, detection, and treatment of acute complications.  Demonstrates understanding / competency    Patient understands prevention, detection, and treatment of chronic complications.  Demonstrates understanding / competency    Patient understands how to develop strategies to address psychosocial issues.  Demonstrates understanding / competency    Patient understands how to develop strategies to promote health/change behavior.   Demonstrates understanding / competency      Complications   Last HgB A1C per patient/outside source  7.2 %    How often do you check your blood sugar?  1-2 times/day    Fasting Blood glucose range (mg/dL)  70-129    Postprandial Blood glucose range (mg/dL)  130-179    Number of hyperglycemic episodes per week  2    Can you tell when your blood sugar is high?  Yes    What do you do if your blood sugar is high?  water    Have you had a dilated eye exam in the past 12 months?  Yes    Have you had a dental exam in the past 12 months?  Yes    Are you checking your feet?  Yes      Dietary Intake   Breakfast  eggs and toast or eggs and oatmeal. water.     Lunch  sandwich, fruit, water and salad    Dinner  meat, vegetales, fruit, water    Beverage(s)  water      Exercise   Exercise Type  Light (walking / raking leaves)    How many days per week to you exercise?  7    How many minutes per day do you exercise?  60    Total minutes per week of exercise  420      Patient Education   Nutrition management   Carbohydrate counting;Reviewed blood glucose goals for pre and post meals and how to evaluate the patients' food intake on their blood glucose level.;Food label reading, portion sizes and measuring food.    Medications  Reviewed patients medication for diabetes, action, purpose, timing of dose and side effects.    Monitoring  Taught/discussed recording of test results and interpretation of SMBG.;Daily foot exams;Interpreting lab values - A1C, lipid, urine microalbumina.    Chronic complications  Relationship between chronic complications and blood glucose control;Lipid levels, blood glucose control and heart disease;Assessed and discussed foot care and prevention of foot problems;Nephropathy, what it is, prevention of, the use of ACE, ARB's and early detection of through urine microalbumia.      Individualized Goals (developed by patient)   Physical Activity  Exercise 5-7 days per week;60  minutes per day    Medications  take my medication as prescribed    Monitoring   test my blood glucose as discussed    Reducing Risk  examine blood glucose patterns;do foot checks daily      Patient Self-Evaluation of Goals - Patient rates self as meeting previously set goals (% of time)   Nutrition  >75%    Physical Activity  >75%    Medications  >75%    Monitoring  >75%    Problem Solving  >75%    Reducing Risk  >75%    Health Coping  >75%      Post-Education Assessment   Patient understands the diabetes disease and treatment process.  Demonstrates understanding / competency    Patient understands incorporating nutritional management into lifestyle.  Demonstrates understanding / competency    Patient undertands incorporating physical activity into lifestyle.  Demonstrates understanding / competency    Patient understands using medications safely.  Demonstrates understanding / competency    Patient understands monitoring blood glucose, interpreting and using results  Demonstrates understanding / competency    Patient understands prevention, detection, and treatment of acute complications.  Demonstrates understanding / competency    Patient understands prevention, detection, and treatment of chronic complications.  Demonstrates understanding / competency    Patient understands how to develop strategies to address psychosocial issues.  Demonstrates understanding / competency    Patient understands how to develop strategies to promote health/change behavior.  Demonstrates understanding / competency      Outcomes   Expected Outcomes  Demonstrated interest in learning. Expect positive outcomes    Future DMSE  3-4 months    Program Status  Completed      Subsequent Visit   Since your last visit have you continued or begun to take your medications as prescribed?  Yes    Since your last visit have you had your blood pressure checked?  Yes    Is your most recent blood pressure lower,  unchanged, or higher since your last visit?  Lower    Since your last visit have you experienced any weight changes?  Loss    Weight Loss (lbs)  5    Since your last visit, are you checking your blood glucose at least once a day?  Yes       Individualized Plan for Diabetes Self-Management Training:   Learning Objective:  Patient will have a greater understanding of diabetes self-management. Patient education plan is to attend individual and/or group sessions per assessed needs and concerns.   Plan:   Goals  Keep up the great job! Continue to increase high fiber foods. Increase water intake to a gallon a day, especially on hot days [place] fruit and nuts for snack if needed before dinner if in the field or on tractor.   Ask MD If you could take omega 3 fishi oils to help  lower TG.   Expected Outcomes:  Demonstrated interest in learning. Expect positive outcomes  Education material provided: Meal plan card and My Plate  If problems or questions, patient to contact team via:  Phone and Email  Future DSME appointment: 3-4 months  Could consider increaseing Metformin to 1000 mg BID for maximum benefit.  Ask MD If you could take omega 3 fishi oils to help lower TG.

## 2017-11-23 ENCOUNTER — Telehealth: Payer: Self-pay | Admitting: *Deleted

## 2017-11-23 MED ORDER — LOSARTAN POTASSIUM 100 MG PO TABS: 100.0000 mg | ORAL_TABLET | Freq: Every day | ORAL | 1 refills | Status: DC

## 2017-11-23 NOTE — Telephone Encounter (Signed)
Please let the patient know that I sent losartan for him

## 2017-11-23 NOTE — Telephone Encounter (Signed)
Fax received [facility] Irbesartan on indefinite manufacturer back order Please advise on alternative

## 2017-11-26 NOTE — Telephone Encounter (Signed)
Wife aware per dpr.  

## 2017-11-28 ENCOUNTER — Other Ambulatory Visit: Payer: Self-pay | Admitting: Family Medicine

## 2017-11-28 NOTE — Telephone Encounter (Signed)
OV 12/07/17

## 2017-12-07 ENCOUNTER — Encounter: Payer: Self-pay | Admitting: Family Medicine

## 2017-12-07 ENCOUNTER — Ambulatory Visit (INDEPENDENT_AMBULATORY_CARE_PROVIDER_SITE_OTHER): Payer: Medicare Other | Admitting: Family Medicine

## 2017-12-07 VITALS — BP 131/76 | HR 58 | Temp 96.8°F | Ht 66.0 in | Wt 169.2 lb

## 2017-12-07 DIAGNOSIS — E663 Overweight: Secondary | ICD-10-CM | POA: Diagnosis not present

## 2017-12-07 DIAGNOSIS — E1159 Type 2 diabetes mellitus with other circulatory complications: Secondary | ICD-10-CM

## 2017-12-07 DIAGNOSIS — I10 Essential (primary) hypertension: Secondary | ICD-10-CM | POA: Diagnosis not present

## 2017-12-07 DIAGNOSIS — E785 Hyperlipidemia, unspecified: Secondary | ICD-10-CM | POA: Diagnosis not present

## 2017-12-07 DIAGNOSIS — E1169 Type 2 diabetes mellitus with other specified complication: Secondary | ICD-10-CM

## 2017-12-07 LAB — BMP8+EGFR
BUN / CREAT RATIO: 19 (ref 10–24)
BUN: 23 mg/dL (ref 8–27)
CHLORIDE: 105 mmol/L (ref 96–106)
CO2: 23 mmol/L (ref 20–29)
CREATININE: 1.19 mg/dL (ref 0.76–1.27)
Calcium: 9.9 mg/dL (ref 8.6–10.2)
GFR calc non Af Amer: 61 mL/min/{1.73_m2} (ref >59)
GFR, EST AFRICAN AMERICAN: 70 mL/min/{1.73_m2} (ref >59)
GLUCOSE: 153 mg/dL — AB (ref 65–99)
Potassium: 5.3 mmol/L — ABNORMAL HIGH (ref 3.5–5.2)
SODIUM: 141 mmol/L (ref 134–144)

## 2017-12-07 LAB — BAYER DCA HB A1C WAIVED: HB A1C: 7.3 % — AB (ref <7.0)

## 2017-12-07 MED ORDER — FAMOTIDINE 20 MG PO TABS: 20.0000 mg | ORAL_TABLET | Freq: Every day | ORAL | 3 refills | Status: DC

## 2017-12-07 NOTE — Progress Notes (Signed)
 BP 131/76   Pulse (!) 58   Temp (!) 96.8 F (36 C) (Oral)   Ht 5' 6" (1.676 m)   Wt 169 lb 3.2 oz (76.7 kg)   BMI 27.31 kg/m    Subjective:    Patient ID: Jeffrey Rubio, male    DOB: 07/19/1945, 72 y.o.   MRN: [id number]  HPI: Ronzell Lynn Castagna is a 72 y.o. male presenting on 12/07/2017 for Diabetes (3 month follow up) and Hypertension   HPI Type 2 diabetes mellitus Patient comes in today for recheck of his diabetes. Patient has been currently taking metformin. Patient is currently on an ACE inhibitor/ARB. Patient has not seen an ophthalmologist this year. Patient denies any issues with their feet.   Hypertension Patient is currently on a irbesartan and amlodipine, and their blood pressure today is 131/76. Patient denies any lightheadedness or dizziness. Patient denies headaches, blurred vision, chest pains, shortness of breath, or weakness. Denies any side effects from medication and is content with current medication.   Hyperlipidemia Patient is coming in for recheck of his hyperlipidemia. The patient is currently taking pravastatin. They deny any issues with myalgias or history of liver damage from it. They deny any focal numbness or weakness or chest pain.   Relevant past medical, surgical, family and social history reviewed and updated as indicated. Interim medical history since our last visit reviewed. Allergies and medications reviewed and updated.  Review of Systems  Constitutional: Negative for chills and fever.  Eyes: Negative for visual disturbance.  Respiratory: Negative for shortness of breath and wheezing.   Cardiovascular: Negative for chest pain and leg swelling.  Musculoskeletal: Negative for back pain and gait problem.  Skin: Negative for rash.  Neurological: Negative for dizziness, weakness and light-headedness.  All other systems reviewed and are negative.   Per HPI unless specifically indicated above   Allergies as of 12/07/2017      Reactions   Viibryd [vilazodone Hcl]       Medication List        Accurate as of 12/07/17 10:59 AM. Always use your most recent med list.          ACCU-CHEK AVIVA PLUS w/Device Kit USE to check blood sugars daily   accu-chek soft touch lancets Check BS daily   acetaminophen 500 MG tablet Commonly known as:  TYLENOL Take 500 mg by mouth every 6 (six) hours as needed for mild pain or moderate pain.   amLODipine 5 MG tablet Commonly known as:  NORVASC Take 1 tablet (5 mg total) by mouth daily.   aspirin 81 MG tablet Take 81 mg by mouth daily.   EQL SUPER OMEGA-3 Caps Take 4 capsules by mouth daily. 2 capsules in morning and 2 capsules in the evening   famotidine 20 MG tablet Commonly known as:  PEPCID Take 1 tablet (20 mg total) by mouth daily.   FREESTYLE LIBRE 14 DAY READER Devi 1 each by Does not apply route every 14 (fourteen) days.   FREESTYLE LIBRE 14 DAY SENSOR Misc 1 each by Does not apply route every 14 (fourteen) days.   glucose blood test strip Check BS daily   irbesartan 300 MG tablet Commonly known as:  AVAPRO Take 1 tablet (300 mg total) by mouth daily.   magnesium gluconate 500 MG tablet Commonly known as:  MAGONATE Take 500 mg by mouth every other day.   METAMUCIL MULTIHEALTH FIBER PO Take 2 packets by mouth daily before supper.   metFORMIN 500   MG tablet Commonly known as:  GLUCOPHAGE TAKE 1 TABLET BY MOUTH TWO TIMES DAILY WITH MEALS. Resume 01/13/17.   pravastatin 20 MG tablet Commonly known as:  PRAVACHOL Take 1 tablet (20 mg total) by mouth daily.   traMADol 50 MG tablet Commonly known as:  ULTRAM Take 1 tablet (50 mg total) by mouth every 12 (twelve) hours as needed for moderate pain or severe pain.   vitamin B-12 1000 MCG tablet Commonly known as:  CYANOCOBALAMIN Take 1,000 mcg by mouth every other day.   Vitamin D3 2000 units Tabs Take 4,000 Units by mouth every morning.   zinc gluconate 50 MG tablet Take 50 mg by mouth daily.           Objective:    BP 131/76   Pulse (!) 58   Temp (!) 96.8 F (36 C) (Oral)   Ht 5' 6" (1.676 m)   Wt 169 lb 3.2 oz (76.7 kg)   BMI 27.31 kg/m   Wt Readings from Last 3 Encounters:  12/07/17 169 lb 3.2 oz (76.7 kg)  09/17/17 171 lb (77.6 kg)  09/06/17 172 lb (78 kg)    Physical Exam  Constitutional: He is oriented to person, place, and time. He appears well-developed and well-nourished. No distress.  Eyes: Conjunctivae are normal. No scleral icterus.  Neck: Neck supple. No thyromegaly present.  Cardiovascular: Normal rate, regular rhythm, normal heart sounds and intact distal pulses.  No murmur heard. Pulmonary/Chest: Effort normal and breath sounds normal. No respiratory distress. He has no wheezes.  Musculoskeletal: Normal range of motion. He exhibits no edema.  Lymphadenopathy:    He has no cervical adenopathy.  Neurological: He is alert and oriented to person, place, and time. Coordination normal.  Skin: Skin is warm and dry. No rash noted. He is not diaphoretic.  Psychiatric: He has a normal mood and affect. His behavior is normal.  Nursing note and vitals reviewed.      Assessment & Plan:   Problem List Items Addressed This Visit      Cardiovascular and Mediastinum   Hypertension associated with diabetes (HCC) - Primary     Endocrine   Hyperlipidemia associated with type 2 diabetes mellitus (HCC)   DM (diabetes mellitus) (HCC)   Relevant Orders   Bayer DCA Hb A1c Waived   BMP8+EGFR     Other   Overweight (BMI 25.0-29.9)       Follow up plan: Return in about 3 months (around 03/08/2018), or if symptoms worsen or fail to improve, for Diabetes and hypertension.  Counseling provided for all of the vaccine components Orders Placed This Encounter  Procedures  . Bayer DCA Hb A1c Waived  . BMP8+EGFR     , MD [facility] 12/07/2017, 10:59 AM     

## 2018-01-01 ENCOUNTER — Encounter: Payer: Self-pay | Admitting: Nutrition

## 2018-01-01 ENCOUNTER — Encounter: Payer: Medicare Other | Attending: Family Medicine | Admitting: Nutrition

## 2018-01-01 VITALS — Ht 67.0 in | Wt 172.0 lb

## 2018-01-01 DIAGNOSIS — Z6826 Body mass index (BMI) 26.0-26.9, adult: Secondary | ICD-10-CM | POA: Insufficient documentation

## 2018-01-01 DIAGNOSIS — Z713 Dietary counseling and surveillance: Secondary | ICD-10-CM | POA: Insufficient documentation

## 2018-01-01 DIAGNOSIS — E118 Type 2 diabetes mellitus with unspecified complications: Secondary | ICD-10-CM

## 2018-01-01 DIAGNOSIS — IMO0002 Reserved for concepts with insufficient information to code with codable children: Secondary | ICD-10-CM

## 2018-01-01 DIAGNOSIS — E1165 Type 2 diabetes mellitus with hyperglycemia: Secondary | ICD-10-CM | POA: Insufficient documentation

## 2018-01-01 NOTE — Progress Notes (Signed)
Diabetes Self-Management Education  Visit Type: Follow-up  Appt. Start Time: 930 Appt. End Time: 1000  01/01/2018  [name], identified by name and date of birth, is a 72 y.o. male with a diagnosis of Diabetes:  . Wt stables. Working on his house. Saw Dr. Warrick Parisian.  FBS 130-139 mg/dl A1C last  7.1-2%. Metformin 500 mg BID. He has reduced amount of cholesterol medication. Feels better now than he has in a long time.  ASSESSMENT  Height 5\' 7"  (1.702 m), weight 172 lb (78 kg). Body mass index is 26.94 kg/m.  Vitals with BMI 01/01/2018  Height 5\' 7"   Weight 172 lbs  BMI 35.36  Systolic   Diastolic   Pulse   Respirations     Diabetes Self-Management Education - 01/01/18 1004      Visit Information   Visit Type  Follow-up      Health Coping   How would you rate your overall health?  Good      Psychosocial Assessment   Patient Belief/Attitude about Diabetes  Motivated to manage diabetes      Pre-Education Assessment   Patient understands the diabetes disease and treatment process.  Demonstrates understanding / competency    Patient understands incorporating nutritional management into lifestyle.  Demonstrates understanding / competency    Patient undertands incorporating physical activity into lifestyle.  Demonstrates understanding / competency    Patient understands using medications safely.  Demonstrates understanding / competency    Patient understands monitoring blood glucose, interpreting and using results  Demonstrates understanding / competency    Patient understands prevention, detection, and treatment of acute complications.  Demonstrates understanding / competency    Patient understands prevention, detection, and treatment of chronic complications.  Demonstrates understanding / competency    Patient understands how to develop strategies to address psychosocial issues.  Demonstrates understanding / competency    Patient understands how to develop strategies  to promote health/change behavior.  Demonstrates understanding / competency      Complications   Last HgB A1C per patient/outside source  7.1 %    How often do you check your blood sugar?  1-2 times/day    Fasting Blood glucose range (mg/dL)  130-179    Have you had a dilated eye exam in the past 12 months?  Yes    Have you had a dental exam in the past 12 months?  Yes    Are you checking your feet?  Yes      Dietary Intake   Breakfast  eggs and toast    Lunch  meat vegetables or sandwich and fruits, eater    Dinner  meat, vegetables, fruits, water    Beverage(s)  water      Exercise   Exercise Type  Light (walking / raking leaves)    How many days per week to you exercise?  5    How many minutes per day do you exercise?  6    Total minutes per week of exercise  30      Patient Education   Previous Diabetes Education  Yes (please comment)    Nutrition management   Food label reading, portion sizes and measuring food.;Carbohydrate counting;Meal timing in regards to the patients' current diabetes medication.    Physical activity and exercise   Identified with patient nutritional and/or medication changes necessary with exercise.;Helped patient identify appropriate exercises in relation to his/her diabetes, diabetes complications and other health issue.    Medications  Reviewed medication adjustment guidelines for  hyperglycemia and sick days.;Reviewed patients medication for diabetes, action, purpose, timing of dose and side effects.    Acute complications  Taught treatment of hypoglycemia - the 15 rule.;Discussed and identified patients' treatment of hyperglycemia.    Chronic complications  Lipid levels, blood glucose control and heart disease;Assessed and discussed foot care and prevention of foot problems;Retinopathy and reason for yearly dilated eye exams;Identified and discussed with patient  current chronic complications      Individualized Goals (developed by patient)   Nutrition   Follow meal plan discussed    Physical Activity  Exercise 5-7 days per week    Medications  take my medication as prescribed    Monitoring   test my blood glucose as discussed      Patient Self-Evaluation of Goals - Patient rates self as meeting previously set goals (% of time)   Nutrition  >75%    Physical Activity  >75%    Medications  >75%    Monitoring  >75%    Problem Solving  >75%    Reducing Risk  >75%    Health Coping  >75%      Post-Education Assessment   Patient understands the diabetes disease and treatment process.  Demonstrates understanding / competency    Patient understands incorporating nutritional management into lifestyle.  Demonstrates understanding / competency    Patient undertands incorporating physical activity into lifestyle.  Demonstrates understanding / competency    Patient understands using medications safely.  Demonstrates understanding / competency    Patient understands monitoring blood glucose, interpreting and using results  Demonstrates understanding / competency    Patient understands prevention, detection, and treatment of acute complications.  Demonstrates understanding / competency    Patient understands prevention, detection, and treatment of chronic complications.  Demonstrates understanding / competency    Patient understands how to develop strategies to address psychosocial issues.  Demonstrates understanding / competency    Patient understands how to develop strategies to promote health/change behavior.  Demonstrates understanding / competency      Outcomes   Expected Outcomes  Demonstrated interest in learning. Expect positive outcomes    Future DMSE  6 months    Program Status  Completed      Subsequent Visit   Since your last visit have you continued or begun to take your medications as prescribed?  Yes    Since your last visit have you had your blood pressure checked?  Yes    Is your most recent blood pressure lower, unchanged, or  higher since your last visit?  Lower    Since your last visit have you experienced any weight changes?  No change    Since your last visit, are you checking your blood glucose at least once a day?  Yes       Individualized Plan for Diabetes Self-Management Training:   Learning Objective:  Patient will have a greater understanding of diabetes self-management. Patient education plan is to attend individual and/or group sessions per assessed needs and concerns.   Plan:   Patient Instructions  [place] 1 Keep up the great job. Start walking  30 minutes 3 times per week. Keep A1C down to 7%.    Expected Outcomes:  Demonstrated interest in learning. Expect positive outcomes  Education material provided: My Plate and Carbohydrate counting sheet  If problems or questions, patient to contact team via:  Phone and Email  Future DSME appointment: 6 months

## 2018-01-01 NOTE — Patient Instructions (Signed)
[place] 1 Keep up the great job. Start walking  30 minutes 3 times per week. Keep A1C down to 7%.

## 2018-01-25 ENCOUNTER — Other Ambulatory Visit: Payer: Self-pay | Admitting: Family Medicine

## 2018-03-04 ENCOUNTER — Telehealth: Payer: Self-pay | Admitting: *Deleted

## 2018-03-04 MED ORDER — OLMESARTAN MEDOXOMIL 40 MG PO TABS: 40.0000 mg | ORAL_TABLET | Freq: Every day | ORAL | 3 refills | Status: DC

## 2018-03-04 NOTE — Telephone Encounter (Signed)
I sent olmesartan for the patient, hopefully that one they have

## 2018-03-04 NOTE — Addendum Note (Signed)
Addended by: Caryl Pina on: 03/04/2018 12:07 PM   Modules accepted: Orders

## 2018-03-04 NOTE — Telephone Encounter (Signed)
VM from patient Jeffrey Rubio 300 mg [facility] can no longer get Pt has been out of for 4 days Please advise patient of alternative

## 2018-03-04 NOTE — Telephone Encounter (Signed)
Pt aware.

## 2018-03-13 ENCOUNTER — Ambulatory Visit (INDEPENDENT_AMBULATORY_CARE_PROVIDER_SITE_OTHER): Payer: Medicare Other | Admitting: Family Medicine

## 2018-03-13 ENCOUNTER — Encounter: Payer: Self-pay | Admitting: Family Medicine

## 2018-03-13 VITALS — BP 135/80 | HR 54 | Temp 96.6°F | Ht 67.0 in | Wt 179.0 lb

## 2018-03-13 DIAGNOSIS — E1169 Type 2 diabetes mellitus with other specified complication: Secondary | ICD-10-CM | POA: Diagnosis not present

## 2018-03-13 DIAGNOSIS — E785 Hyperlipidemia, unspecified: Secondary | ICD-10-CM

## 2018-03-13 DIAGNOSIS — E663 Overweight: Secondary | ICD-10-CM | POA: Diagnosis not present

## 2018-03-13 DIAGNOSIS — Z23 Encounter for immunization: Secondary | ICD-10-CM | POA: Diagnosis not present

## 2018-03-13 DIAGNOSIS — I10 Essential (primary) hypertension: Secondary | ICD-10-CM

## 2018-03-13 DIAGNOSIS — E1159 Type 2 diabetes mellitus with other circulatory complications: Secondary | ICD-10-CM | POA: Diagnosis not present

## 2018-03-13 LAB — BAYER DCA HB A1C WAIVED: HB A1C (BAYER DCA - WAIVED): 7 % — ABNORMAL HIGH (ref <7.0)

## 2018-03-13 NOTE — Progress Notes (Signed)
BP 135/80   Pulse (!) 54   Temp (!) 96.6 F (35.9 C) (Oral)   Ht _0  (1.702 m)   Wt 179 lb (81.2 kg)   BMI 28.04 kg/m    Subjective:    Patient ID: Jeffrey Rubio, male    DOB: [birth year], 72 y.o.   MRN: [id number]  HPI: Jeffrey Rubio is a 72 y.o. male presenting on 03/13/2018 for Diabetes (three month recheck)   HPI Type 2 diabetes mellitus Patient comes in today for recheck of his diabetes. Patient has been currently taking metformin. Patient is currently on an ACE inhibitor/ARB. Patient has seen an ophthalmologist this year. Patient denies any issues with their feet.   Hyperlipidemia Patient is coming in for recheck of his hyperlipidemia. The patient is currently taking pravastatin. They deny any issues with myalgias or history of liver damage from it. They deny any focal numbness or weakness or chest pain.   Hypertension Patient is currently on olmesartan but patient has been off of it because he had been out of it and has just been taking the amlodipine, and their blood pressure today is 135/80. Patient denies any lightheadedness or dizziness. Patient denies headaches, blurred vision, chest pains, shortness of breath, or weakness. Denies any side effects from medication and is content with current medication.   Relevant past medical, surgical, family and social history reviewed and updated as indicated. Interim medical history since our last visit reviewed. Allergies and medications reviewed and updated.  Review of Systems  Constitutional: Negative for chills and fever.  Eyes: Negative for visual disturbance.  Respiratory: Negative for shortness of breath and wheezing.   Cardiovascular: Negative for chest pain and leg swelling.  Musculoskeletal: Negative for back pain and gait problem.  Skin: Negative for rash.  Neurological: Negative for dizziness, weakness, light-headedness and numbness.  All other systems reviewed and are negative.   Per HPI unless  specifically indicated above   Allergies as of 03/13/2018      Reactions   Vilazodone Hcl    Other reaction(s): Other      Medication List        Accurate as of 03/13/18 10:43 AM. Always use your most recent med list.          ACCU-CHEK AVIVA PLUS test strip Generic drug:  glucose blood CHECK BLOOD SUGAR TWICE A DAY AND AS NEEDED   ACCU-CHEK AVIVA PLUS w/Device Kit USE to check blood sugars daily   accu-chek soft touch lancets Check BS daily   acetaminophen 500 MG tablet Commonly known as:  TYLENOL Take 500 mg by mouth every 6 (six) hours as needed for mild pain or moderate pain.   amLODipine 5 MG tablet Commonly known as:  NORVASC Take 1 tablet (5 mg total) by mouth daily.   aspirin 81 MG tablet Take 81 mg by mouth daily.   EQL SUPER OMEGA-3 Caps Take 4 capsules by mouth daily. 2 capsules in morning and 2 capsules in the evening   famotidine 20 MG tablet Commonly known as:  PEPCID Take 1 tablet (20 mg total) by mouth daily.   FREESTYLE LIBRE 14 DAY READER Devi 1 each by Does not apply route every 14 (fourteen) days.   FREESTYLE LIBRE 14 DAY SENSOR Misc 1 each by Does not apply route every 14 (fourteen) days.   magnesium gluconate 500 MG tablet Commonly known as:  MAGONATE Take 500 mg by mouth every other day.   METAMUCIL MULTIHEALTH FIBER PO Take 2  packets by mouth daily before supper.   metFORMIN 500 MG tablet Commonly known as:  GLUCOPHAGE TAKE 1 TABLET BY MOUTH TWO TIMES DAILY WITH MEALS. Resume 01/13/17.   olmesartan 40 MG tablet Commonly known as:  BENICAR Take 1 tablet (40 mg total) by mouth daily.   pravastatin 20 MG tablet Commonly known as:  PRAVACHOL Take 1 tablet (20 mg total) by mouth daily.   traMADol 50 MG tablet Commonly known as:  ULTRAM Take 1 tablet (50 mg total) by mouth every 12 (twelve) hours as needed for moderate pain or severe pain.   vitamin B-12 1000 MCG tablet Commonly known as:  CYANOCOBALAMIN Take 1,000 mcg by  mouth every other day.   Vitamin D3 50 MCG (2000 UT) Tabs Take 4,000 Units by mouth every morning.   zinc gluconate 50 MG tablet Take 50 mg by mouth daily.          Objective:    BP 135/80   Pulse (!) 54   Temp (!) 96.6 F (35.9 C) (Oral)   Ht _0  (1.702 m)   Wt 179 lb (81.2 kg)   BMI 28.04 kg/m   Wt Readings from Last 3 Encounters:  03/13/18 179 lb (81.2 kg)  01/01/18 172 lb (78 kg)  12/07/17 169 lb 3.2 oz (76.7 kg)    Physical Exam Vitals signs and nursing note reviewed.  Constitutional:      General: He is not in acute distress.    Appearance: He is well-developed. He is not diaphoretic.  Eyes:     General: No scleral icterus.    Conjunctiva/sclera: Conjunctivae normal.  Neck:     Musculoskeletal: Neck supple.     Thyroid: No thyromegaly.  Cardiovascular:     Rate and Rhythm: Normal rate and regular rhythm.     Heart sounds: Normal heart sounds. No murmur.  Pulmonary:     Effort: Pulmonary effort is normal. No respiratory distress.     Breath sounds: Normal breath sounds. No wheezing.  Musculoskeletal: Normal range of motion.        General: No swelling.  Lymphadenopathy:     Cervical: No cervical adenopathy.  Skin:    General: Skin is warm and dry.     Findings: No rash.  Neurological:     Mental Status: He is alert and oriented to person, place, and time.     Coordination: Coordination normal.  Psychiatric:        Behavior: Behavior normal.         Assessment & Plan:   Problem List Items Addressed This Visit      Cardiovascular and Mediastinum   Hypertension associated with diabetes ([place])     Endocrine   Hyperlipidemia associated with type 2 diabetes mellitus ([place])   Relevant Orders   Lipid panel (Completed)   DM (diabetes mellitus) ([place]) - Primary   Relevant Orders   CMP14+EGFR (Completed)   Bayer DCA Hb A1c Waived (Completed)     Other   Overweight (BMI 25.0-29.9)    Other Visit Diagnoses    Encounter for immunization        Relevant Orders   Flu vaccine HIGH DOSE PF (Completed)       Follow up plan: Return in about 3 months (around 06/12/2018), or if symptoms worsen or fail to improve, for Diabetes and hypertension recheck.  Counseling provided for all of the vaccine components Orders Placed This Encounter  Procedures  . CMP14+EGFR  . Lipid panel  . [employer]  [place] Hb A1c Waived    Caryl Pina, MD [facility] 03/13/2018, 10:43 AM

## 2018-03-14 LAB — CMP14+EGFR
ALT: 20 IU/L (ref 0–44)
AST: 19 IU/L (ref 0–40)
Albumin/Globulin Ratio: 1.6 (ref 1.2–2.2)
Albumin: 3.9 g/dL (ref 3.5–4.8)
Alkaline Phosphatase: 76 IU/L (ref 39–117)
BUN / CREAT RATIO: 11 (ref 10–24)
BUN: 14 mg/dL (ref 8–27)
Bilirubin Total: 0.3 mg/dL (ref 0.0–1.2)
CO2: 23 mmol/L (ref 20–29)
CREATININE: 1.23 mg/dL (ref 0.76–1.27)
Calcium: 9.8 mg/dL (ref 8.6–10.2)
Chloride: 105 mmol/L (ref 96–106)
GFR calc Af Amer: 67 mL/min/{1.73_m2} (ref >59)
GFR, EST NON AFRICAN AMERICAN: 58 mL/min/{1.73_m2} — AB (ref >59)
Globulin, Total: 2.5 g/dL (ref 1.5–4.5)
Glucose: 146 mg/dL — ABNORMAL HIGH (ref 65–99)
POTASSIUM: 4.8 mmol/L (ref 3.5–5.2)
Sodium: 145 mmol/L — ABNORMAL HIGH (ref 134–144)
TOTAL PROTEIN: 6.4 g/dL (ref 6.0–8.5)

## 2018-03-14 LAB — LIPID PANEL
Chol/HDL Ratio: 3.6 ratio (ref 0.0–5.0)
Cholesterol, Total: 189 mg/dL (ref 100–199)
HDL: 52 mg/dL (ref >39)
LDL CALC: 85 mg/dL (ref 0–99)
TRIGLYCERIDES: 258 mg/dL — AB (ref 0–149)
VLDL CHOLESTEROL CAL: 52 mg/dL — AB (ref 5–40)

## 2018-03-22 ENCOUNTER — Encounter: Payer: Medicare Other | Admitting: *Deleted

## 2018-04-24 ENCOUNTER — Other Ambulatory Visit: Payer: Self-pay | Admitting: *Deleted

## 2018-04-24 DIAGNOSIS — E1169 Type 2 diabetes mellitus with other specified complication: Secondary | ICD-10-CM

## 2018-04-24 DIAGNOSIS — I10 Essential (primary) hypertension: Principal | ICD-10-CM

## 2018-04-24 DIAGNOSIS — I152 Hypertension secondary to endocrine disorders: Secondary | ICD-10-CM

## 2018-04-24 DIAGNOSIS — E1159 Type 2 diabetes mellitus with other circulatory complications: Secondary | ICD-10-CM

## 2018-04-24 DIAGNOSIS — E785 Hyperlipidemia, unspecified: Secondary | ICD-10-CM

## 2018-04-24 MED ORDER — PRAVASTATIN SODIUM 20 MG PO TABS: 20.0000 mg | ORAL_TABLET | Freq: Every day | ORAL | 1 refills | Status: DC

## 2018-04-24 MED ORDER — AMLODIPINE BESYLATE 5 MG PO TABS: 5.0000 mg | ORAL_TABLET | Freq: Every day | ORAL | 1 refills | Status: DC

## 2018-04-24 NOTE — Addendum Note (Signed)
Addended by: Antonietta Barcelona D on: 04/24/2018 11:21 AM   Modules accepted: Orders

## 2018-04-26 ENCOUNTER — Other Ambulatory Visit: Payer: Self-pay | Admitting: *Deleted

## 2018-04-26 MED ORDER — IRBESARTAN 300 MG PO TABS: 300.0000 mg | ORAL_TABLET | Freq: Every day | ORAL | 3 refills | Status: DC

## 2018-04-26 MED ORDER — FAMOTIDINE 20 MG PO TABS: 20.0000 mg | ORAL_TABLET | Freq: Every day | ORAL | 0 refills | Status: DC

## 2018-04-26 NOTE — Addendum Note (Signed)
Addended by: Caryl Pina on: 04/26/2018 02:34 PM   Modules accepted: Orders

## 2018-04-26 NOTE — Telephone Encounter (Signed)
[facility], pt is moving all medications to mail order Request Rx for Irbesartan 300 mg  Rx was changed 03/04/18 to Olmesartan 40 mg d[facility]t [facility] Please advise

## 2018-04-26 NOTE — Telephone Encounter (Signed)
Hopefully the irbesartan start is back in stock, I sent it for him. Caryl Pina, MD [facility] 04/26/2018, 2:34 PM

## 2018-04-30 ENCOUNTER — Telehealth: Payer: Self-pay | Admitting: *Deleted

## 2018-04-30 MED ORDER — GLUCOSE BLOOD VI STRP: ORAL_STRIP | 3 refills | Status: DC

## 2018-04-30 NOTE — Telephone Encounter (Signed)
Updated Rx for test strips sent, pt tests daily

## 2018-05-21 DIAGNOSIS — E119 Type 2 diabetes mellitus without complications: Secondary | ICD-10-CM | POA: Diagnosis not present

## 2018-05-21 LAB — HM DIABETES EYE EXAM

## 2018-06-12 ENCOUNTER — Encounter: Payer: Self-pay | Admitting: Family Medicine

## 2018-06-12 ENCOUNTER — Ambulatory Visit (INDEPENDENT_AMBULATORY_CARE_PROVIDER_SITE_OTHER): Payer: Medicare Other | Admitting: Family Medicine

## 2018-06-12 ENCOUNTER — Other Ambulatory Visit: Payer: Self-pay

## 2018-06-12 VITALS — BP 133/75 | HR 58 | Temp 95.6°F | Ht 67.0 in | Wt 178.2 lb

## 2018-06-12 DIAGNOSIS — E1169 Type 2 diabetes mellitus with other specified complication: Secondary | ICD-10-CM

## 2018-06-12 DIAGNOSIS — E663 Overweight: Secondary | ICD-10-CM

## 2018-06-12 DIAGNOSIS — E785 Hyperlipidemia, unspecified: Secondary | ICD-10-CM | POA: Diagnosis not present

## 2018-06-12 DIAGNOSIS — I152 Hypertension secondary to endocrine disorders: Secondary | ICD-10-CM

## 2018-06-12 DIAGNOSIS — E1159 Type 2 diabetes mellitus with other circulatory complications: Secondary | ICD-10-CM | POA: Diagnosis not present

## 2018-06-12 DIAGNOSIS — I10 Essential (primary) hypertension: Secondary | ICD-10-CM

## 2018-06-12 LAB — BAYER DCA HB A1C WAIVED: HB A1C (BAYER DCA - WAIVED): 7.1 % — ABNORMAL HIGH (ref <7.0)

## 2018-06-12 MED ORDER — FAMOTIDINE 20 MG PO TABS: 20.0000 mg | ORAL_TABLET | Freq: Every day | ORAL | 3 refills | Status: DC

## 2018-06-12 MED ORDER — MECLIZINE HCL 25 MG PO TABS: 25.0000 mg | ORAL_TABLET | Freq: Three times a day (TID) | ORAL | 0 refills | Status: AC | PRN

## 2018-06-12 NOTE — Progress Notes (Signed)
BP 133/75   Pulse (!) 58   Temp (!) 95.6 F (35.3 C) (Oral)   Ht _0  (1.702 m)   Wt 178 lb 3.2 oz (80.8 kg)   BMI 27.91 kg/m    Subjective:    Patient ID: Jeffrey Rubio, male    DOB: [birth year], 73 y.o.   MRN: [id number]  HPI: Dietrick Barris is a 72 y.o. male presenting on 06/12/2018 for Diabetes (3 month follow up) and Hypertension   HPI Type 2 diabetes mellitus Patient comes in today for recheck of his diabetes. Patient has been currently taking metformin 500 twice a day. Patient is currently on an ACE inhibitor/ARB. Patient has seen an ophthalmologist this year. Patient denies any issues with their feet.   Hyperlipidemia Patient is coming in for recheck of his hyperlipidemia. The patient is currently taking Pravachol. They deny any issues with myalgias or history of liver damage from it. They deny any focal numbness or weakness or chest pain.   Hypertension Patient is currently on amlodipine and irbesartan, and their blood pressure today is 133/75. Patient denies any lightheadedness or dizziness. Patient denies headaches, blurred vision, chest pains, shortness of breath, or weakness. Denies any side effects from medication and is content with current medication.   Relevant past medical, surgical, family and social history reviewed and updated as indicated. Interim medical history since our last visit reviewed. Allergies and medications reviewed and updated.  Review of Systems  Constitutional: Negative for chills and fever.  Eyes: Negative for visual disturbance.  Respiratory: Negative for shortness of breath and wheezing.   Cardiovascular: Negative for chest pain and leg swelling.  Musculoskeletal: Negative for back pain and gait problem.  Skin: Negative for rash.  Neurological: Negative for dizziness, weakness and light-headedness.  All other systems reviewed and are negative.   Per HPI unless specifically indicated above   Allergies as of 06/12/2018    Reactions   Vilazodone Hcl    Other reaction(s): Other      Medication List       Accurate as of June 12, 2018  1:23 PM. Always use your most recent med list.        Accu-Chek Aviva Plus w/Device Kit USE to check blood sugars daily   accu-chek soft touch lancets Check BS daily   acetaminophen 500 MG tablet Commonly known as:  TYLENOL Take 500 mg by mouth every 6 (six) hours as needed for mild pain or moderate pain.   amLODipine 5 MG tablet Commonly known as:  NORVASC Take 1 tablet (5 mg total) by mouth daily.   aspirin 81 MG tablet Take 81 mg by mouth daily.   EQL Super Omega-3 Caps Take 4 capsules by mouth daily. 2 capsules in morning and 2 capsules in the evening   famotidine 20 MG tablet Commonly known as:  PEPCID Take 1 tablet (20 mg total) by mouth daily.   glucose blood test strip Commonly known as:  Accu-Chek Aviva Plus USE to check blood sugars daily   irbesartan 300 MG tablet Commonly known as:  Avapro Take 1 tablet (300 mg total) by mouth daily.   magnesium gluconate 500 MG tablet Commonly known as:  MAGONATE Take 500 mg by mouth every other day.   meclizine 25 MG tablet Commonly known as:  ANTIVERT Take 1 tablet (25 mg total) by mouth 3 (three) times daily as needed for dizziness.   METAMUCIL MULTIHEALTH FIBER PO Take 2 packets by mouth daily before supper.  metFORMIN 500 MG tablet Commonly known as:  GLUCOPHAGE TAKE 1 TABLET BY MOUTH TWO TIMES DAILY WITH MEALS. Resume 01/13/17.   pravastatin 20 MG tablet Commonly known as:  PRAVACHOL Take 1 tablet (20 mg total) by mouth daily.   traMADol 50 MG tablet Commonly known as:  ULTRAM Take 1 tablet (50 mg total) by mouth every 12 (twelve) hours as needed for moderate pain or severe pain.   vitamin B-12 1000 MCG tablet Commonly known as:  CYANOCOBALAMIN Take 1,000 mcg by mouth every other day.   Vitamin D3 50 MCG (2000 UT) Tabs Take 4,000 Units by mouth every morning.   zinc gluconate 50  MG tablet Take 50 mg by mouth daily.          Objective:    BP 133/75   Pulse (!) 58   Temp (!) 95.6 F (35.3 C) (Oral)   Ht _0  (1.702 m)   Wt 178 lb 3.2 oz (80.8 kg)   BMI 27.91 kg/m   Wt Readings from Last 3 Encounters:  06/12/18 178 lb 3.2 oz (80.8 kg)  03/13/18 179 lb (81.2 kg)  01/01/18 172 lb (78 kg)    Physical Exam Vitals signs and nursing note reviewed.  Constitutional:      General: He is not in acute distress.    Appearance: He is well-developed. He is not diaphoretic.  Eyes:     General: No scleral icterus.    Conjunctiva/sclera: Conjunctivae normal.  Neck:     Musculoskeletal: Neck supple.     Thyroid: No thyromegaly.  Cardiovascular:     Rate and Rhythm: Normal rate and regular rhythm.     Heart sounds: Normal heart sounds. No murmur.  Pulmonary:     Effort: Pulmonary effort is normal. No respiratory distress.     Breath sounds: Normal breath sounds. No wheezing.  Musculoskeletal: Normal range of motion.  Lymphadenopathy:     Cervical: No cervical adenopathy.  Skin:    General: Skin is warm and dry.     Findings: No rash.  Neurological:     Mental Status: He is alert and oriented to person, place, and time.     Coordination: Coordination normal.  Psychiatric:        Behavior: Behavior normal.     Results for orders placed or performed in visit on 05/21/18  HM DIABETES EYE EXAM  Result Value Ref Range   HM Diabetic Eye Exam No Retinopathy No Retinopathy      Assessment & Plan:   Problem List Items Addressed This Visit      Cardiovascular and Mediastinum   Hypertension associated with diabetes ([place]) - Primary     Endocrine   Hyperlipidemia associated with type 2 diabetes mellitus ([place])   DM (diabetes mellitus) ([place])   Relevant Orders   Bayer DCA Hb A1c Waived     Other   Overweight (BMI 25.0-29.9)      Continue irbesartan Avapro, patient is having little bit of vertigo will send meclizine but he has used the maneuvers and is  stopped for now.   Follow up plan: Return in about 3 months (around 09/12/2018), or if symptoms worsen or fail to improve, for Hypertension and diabetes.  Counseling provided for all of the vaccine components Orders Placed This Encounter  Procedures  . [facility] Hb A1c Lewis, MD [facility] 06/12/2018, 1:23 PM

## 2018-07-03 ENCOUNTER — Other Ambulatory Visit: Payer: Self-pay

## 2018-07-03 ENCOUNTER — Encounter: Payer: Self-pay | Admitting: Nutrition

## 2018-07-03 ENCOUNTER — Encounter: Payer: Medicare Other | Attending: Family Medicine | Admitting: Nutrition

## 2018-07-03 VITALS — Wt 168.0 lb

## 2018-07-03 DIAGNOSIS — E118 Type 2 diabetes mellitus with unspecified complications: Secondary | ICD-10-CM | POA: Insufficient documentation

## 2018-07-03 DIAGNOSIS — E119 Type 2 diabetes mellitus without complications: Secondary | ICD-10-CM

## 2018-07-03 DIAGNOSIS — IMO0002 Reserved for concepts with insufficient information to code with codable children: Secondary | ICD-10-CM

## 2018-07-03 DIAGNOSIS — E1165 Type 2 diabetes mellitus with hyperglycemia: Secondary | ICD-10-CM | POA: Diagnosis not present

## 2018-07-03 NOTE — Patient Instructions (Addendum)
Keep up the great job!! Continue to eat three balanced meals. Don't eat past 7 pm Keep FBS less than 130 mg/dl if you can. Don't skip meals Continue to increase lower carb vegetables.

## 2018-07-03 NOTE — Progress Notes (Signed)
Diabetes Self-Management Education  Visit Type:  [facility] followup DM visit  Appt. Start Time: 930 Appt. End Time: 1000  07/03/2018 Phone visit.    [name], identified by name and date of birth, is a 73 y.o. male with a diagnosis of Diabetes:  . Wt stables.Doping well. BS stable. 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 Saw Dr. Warrick Rubio.  FBS 130-140's.mg/dl A1C last  7.1-%. Metformin 500 mg BID. He has reduced amount of cholesterol medication. Feels better now than he has in a long time. Bring more active out on farm. FBS 140's in am.  A1`C 7.1% ASSESSMENT  There were no vitals taken for this visit. There is no height or weight on file to calculate BMI.  Vitals with BMI 01/01/2018  Height 5\' 7"   Weight 172 lbs  BMI 78.97  Systolic   Diastolic   Pulse   Respirations    B) egg, grits and 1 tsp molasses, biscuit, fruit L)Salad or meat vegetables. water D [place] sandwich and salad, fruit, water   Individualized Plan for Diabetes Self-Management Training:   Learning Objective:  Patient will have a greater understanding of diabetes self-management. Patient education plan is to attend individual and/or group sessions per assessed needs and concerns.   Plan:  Keep up the great job!! Continue to eat three balanced meals. Don't eat past 7 pm Keep FBS less than 130 mg/dl if you can. Don't skip meals Continue to increase lower carb vegetables.  Expected Outcomes:   IMproved BS   Education material provided: My Plate and Carbohydrate counting sheet  If problems or questions, patient to contact team via:  Phone and Email  Future DSME appointment:  6 months.

## 2018-07-17 ENCOUNTER — Other Ambulatory Visit: Payer: Self-pay | Admitting: Family Medicine

## 2018-07-18 ENCOUNTER — Telehealth: Payer: Self-pay | Admitting: *Deleted

## 2018-07-18 NOTE — Telephone Encounter (Signed)
Fax from [facility] Irbesartan 300 mg is manufacturer backordered Alternative requested Please advise

## 2018-07-19 MED ORDER — IRBESARTAN 150 MG PO TABS: 300.0000 mg | ORAL_TABLET | Freq: Every day | ORAL | 0 refills | Status: DC

## 2018-07-19 NOTE — Addendum Note (Signed)
Addended by: Caryl Pina on: 07/19/2018 01:16 PM   Modules accepted: Orders

## 2018-07-19 NOTE — Telephone Encounter (Signed)
I sent Avapro 150 take 2 daily instead of his 300s because they said it was backordered.

## 2018-07-19 NOTE — Telephone Encounter (Signed)
Left details on voice mail.  Call us back to confirm receiving message.

## 2018-07-23 NOTE — Telephone Encounter (Signed)
Left message with  medication changes and to please call our office for any questions.

## 2018-08-14 ENCOUNTER — Other Ambulatory Visit: Payer: Self-pay | Admitting: *Deleted

## 2018-09-11 ENCOUNTER — Other Ambulatory Visit: Payer: Self-pay

## 2018-09-12 ENCOUNTER — Encounter: Payer: Self-pay | Admitting: Family Medicine

## 2018-09-12 ENCOUNTER — Other Ambulatory Visit: Payer: Self-pay

## 2018-09-12 ENCOUNTER — Ambulatory Visit (INDEPENDENT_AMBULATORY_CARE_PROVIDER_SITE_OTHER): Payer: Medicare Other | Admitting: Family Medicine

## 2018-09-12 VITALS — BP 125/69 | HR 58 | Temp 97.8°F | Ht 67.0 in | Wt 173.8 lb

## 2018-09-12 DIAGNOSIS — E1159 Type 2 diabetes mellitus with other circulatory complications: Secondary | ICD-10-CM | POA: Diagnosis not present

## 2018-09-12 DIAGNOSIS — G8929 Other chronic pain: Secondary | ICD-10-CM

## 2018-09-12 DIAGNOSIS — M545 Low back pain: Secondary | ICD-10-CM

## 2018-09-12 DIAGNOSIS — Z0289 Encounter for other administrative examinations: Secondary | ICD-10-CM | POA: Diagnosis not present

## 2018-09-12 DIAGNOSIS — I10 Essential (primary) hypertension: Secondary | ICD-10-CM | POA: Diagnosis not present

## 2018-09-12 DIAGNOSIS — E1169 Type 2 diabetes mellitus with other specified complication: Secondary | ICD-10-CM

## 2018-09-12 DIAGNOSIS — E785 Hyperlipidemia, unspecified: Secondary | ICD-10-CM

## 2018-09-12 LAB — LIPID PANEL

## 2018-09-12 LAB — BAYER DCA HB A1C WAIVED: HB A1C (BAYER DCA - WAIVED): 6.5 % (ref <7.0)

## 2018-09-12 MED ORDER — TRAMADOL HCL 50 MG PO TABS: 50.0000 mg | ORAL_TABLET | Freq: Two times a day (BID) | ORAL | 1 refills | Status: DC | PRN

## 2018-09-12 NOTE — Progress Notes (Signed)
BP 125/69   Pulse (!) 58   Temp 97.8 F (36.6 C) (Oral)   Ht _0  (1.702 m)   Wt 173 lb 12.8 oz (78.8 kg)   BMI 27.22 kg/m    Subjective:   Patient ID: Jeffrey Rubio, male    DOB: 22-Nov-1945, 73 y.o.   MRN: [id number]  HPI: Jerian Morais is a 73 y.o. 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 due to osteoarthritis Pain location-right hip and right lower back Pain on scale of 1-10-0 today Frequency-a few times a month, does not use it every day What increases pain-overworking sometimes What makes pain Better-stretching and resting and the occasional use of tramadol and also using Tylenol Effects on ADL -none Any change in general medical condition-none  Current opioids rx-tramadol 50 mg twice daily PRN # meds rx-30 Effectiveness of current meds-works well, does not have to use it maybe once or twice a week Adverse reactions form pain meds-none Morphine equivalent-10  Pill count performed-No Last drug screen -today ( high risk q70m moderate risk q669mlow risk yearly ) Urine drug screen today- Yes [place]yes  If yes were their any concerning findings? -None  No flowsheet data found.   Pain contract signed on: 09/12/2018  Hypertension Patient is currently on amlodipine and irbesartan, and their blood pressure today is 125/69. Patient denies any lightheadedness or dizziness. Patient denies headaches, blurred vision, chest pains, shortness of breath, or weakness. Denies any side effects from medication and is content with current medication.   Hyperlipidemia Patient is coming in for recheck of his hyperlipidemia. The patient is currently taking pravastatin. They deny any issues with myalgias or history of liver damage from it. They deny any focal numbness or weakness or chest pain.   Type  2 diabetes mellitus Patient comes in today for recheck of his diabetes. Patient has been currently taking metformin. Patient is currently on an ACE inhibitor/ARB. Patient has not seen an ophthalmologist this year. Patient denies any issues with their feet.   Relevant past medical, surgical, family and social history reviewed and updated as indicated. Interim medical history since our last visit reviewed. Allergies and medications reviewed and updated.  Review of Systems  Constitutional: Negative for chills and fever.  Eyes: Negative for visual disturbance.  Respiratory: Negative for shortness of breath and wheezing.   Cardiovascular: Negative for chest pain and leg swelling.  Musculoskeletal: Positive for arthralgias and back pain. Negative for gait problem.  Skin: Negative for rash.  Neurological: Negative for dizziness, weakness and light-headedness.  All other systems reviewed and are negative.   Per HPI unless specifically indicated above   Allergies as of 09/12/2018      Reactions   Vilazodone Hcl    Other reaction(s): Other      Medication List       Accurate as of September 12, 2018 10:32 AM. If you have any questions, ask your nurse or doctor.        STOP taking these medications   famotidine 20 MG tablet Commonly known as: PEPCID Stopped by: JoFransisca Kaufmannettinger, MD     TAKE these medications   Accu-Chek Aviva Plus w/Device Kit USE to check blood sugars daily   accu-chek soft touch lancets Check BS daily   acetaminophen 500 MG tablet Commonly known as: TYLENOL Take  500 mg by mouth every 6 (six) hours as needed for mild pain or moderate pain.   amLODipine 5 MG tablet Commonly known as: NORVASC Take 1 tablet (5 mg total) by mouth daily.   aspirin 81 MG tablet Take 81 mg by mouth daily.   EQL Super Omega-3 Caps Take 4 capsules by mouth daily. 2 capsules in morning and 2 capsules in the evening   glucose blood test strip Commonly known as: Accu-Chek Aviva  Plus USE to check blood sugars daily   irbesartan 150 MG tablet Commonly known as: Avapro Take 2 tablets (300 mg total) by mouth daily.   magnesium gluconate 500 MG tablet Commonly known as: MAGONATE Take 500 mg by mouth every other day.   meclizine 25 MG tablet Commonly known as: ANTIVERT Take 1 tablet (25 mg total) by mouth 3 (three) times daily as needed for dizziness.   METAMUCIL MULTIHEALTH FIBER PO Take 2 packets by mouth daily before supper.   metFORMIN 500 MG tablet Commonly known as: GLUCOPHAGE TAKE 1 TABLET BY MOUTH TWO TIMES DAILY WITH MEALS. Resume 01/13/17.   pravastatin 20 MG tablet Commonly known as: PRAVACHOL Take 1 tablet (20 mg total) by mouth daily.   traMADol 50 MG tablet Commonly known as: ULTRAM Take by mouth every 6 (six) hours as needed.   vitamin B-12 1000 MCG tablet Commonly known as: CYANOCOBALAMIN Take 1,000 mcg by mouth every other day.   Vitamin D3 50 MCG (2000 UT) Tabs Take 4,000 Units by mouth every morning.   zinc gluconate 50 MG tablet Take 50 mg by mouth daily.        Objective:   BP 125/69   Pulse (!) 58   Temp 97.8 F (36.6 C) (Oral)   Ht _0  (1.702 m)   Wt 173 lb 12.8 oz (78.8 kg)   BMI 27.22 kg/m   Wt Readings from Last 3 Encounters:  09/12/18 173 lb 12.8 oz (78.8 kg)  07/03/18 168 lb (76.2 kg)  06/12/18 178 lb 3.2 oz (80.8 kg)    Physical Exam Vitals signs and nursing note reviewed.  Constitutional:      General: He is not in acute distress.    Appearance: He is well-developed. He is not diaphoretic.  Eyes:     General: No scleral icterus.    Conjunctiva/sclera: Conjunctivae normal.  Neck:     Musculoskeletal: Neck supple.     Thyroid: No thyromegaly.  Cardiovascular:     Rate and Rhythm: Normal rate and regular rhythm.     Heart sounds: Normal heart sounds. No murmur.  Pulmonary:     Effort: Pulmonary effort is normal. No respiratory distress.     Breath sounds: Normal breath sounds. No wheezing.   Musculoskeletal: Normal range of motion.        General: No tenderness (No tenderness on exam today).  Lymphadenopathy:     Cervical: No cervical adenopathy.  Skin:    General: Skin is warm and dry.     Findings: No rash.  Neurological:     Mental Status: He is alert and oriented to person, place, and time.     Coordination: Coordination normal.  Psychiatric:        Behavior: Behavior normal.       Assessment & Plan:   Problem List Items Addressed This Visit      Cardiovascular and Mediastinum   Hypertension associated with diabetes ([place]) - Primary   Relevant Orders   CBC with Differential/Platelet (Completed)  CMP14+EGFR (Completed)     Endocrine   Hyperlipidemia associated with type 2 diabetes mellitus ([place])   Relevant Orders   CBC with Differential/Platelet (Completed)   Lipid panel (Completed)   DM (diabetes mellitus) ([place])   Relevant Orders   Bayer DCA Hb A1c Waived (Completed)   CBC with Differential/Platelet (Completed)   CMP14+EGFR (Completed)     Other   Pain management contract signed    Other Visit Diagnoses    Chronic bilateral low back pain without sciatica       Relevant Medications   traMADol (ULTRAM) 50 MG tablet    Refill tramadol and will do blood work today, continue other medication, no changes.  Follow up plan: Return in about 3 months (around 12/13/2018), or if symptoms worsen or fail to improve, for Diabetes and hypertension recheck.  Counseling provided for all of the vaccine components Orders Placed This Encounter  Procedures  . Bayer DCA Hb A1c Waived  . CBC with Differential/Platelet  . CMP14+EGFR  . Lipid panel    Caryl Pina, MD [facility] 09/12/2018, 10:32 AM

## 2018-09-13 LAB — CMP14+EGFR
ALT: 19 IU/L (ref 0–44)
AST: 12 IU/L (ref 0–40)
Albumin/Globulin Ratio: 1.8 (ref 1.2–2.2)
Albumin: 4.4 g/dL (ref 3.7–4.7)
Alkaline Phosphatase: 78 IU/L (ref 39–117)
BUN/Creatinine Ratio: 28 — ABNORMAL HIGH (ref 10–24)
BUN: 30 mg/dL — ABNORMAL HIGH (ref 8–27)
Bilirubin Total: 0.3 mg/dL (ref 0.0–1.2)
CO2: 21 mmol/L (ref 20–29)
Calcium: 9.9 mg/dL (ref 8.6–10.2)
Chloride: 104 mmol/L (ref 96–106)
Creatinine, Ser: 1.09 mg/dL (ref 0.76–1.27)
GFR calc Af Amer: 78 mL/min/{1.73_m2} (ref >59)
GFR calc non Af Amer: 67 mL/min/{1.73_m2} (ref >59)
Globulin, Total: 2.5 g/dL (ref 1.5–4.5)
Glucose: 150 mg/dL — ABNORMAL HIGH (ref 65–99)
Potassium: 4.9 mmol/L (ref 3.5–5.2)
Sodium: 140 mmol/L (ref 134–144)
Total Protein: 6.9 g/dL (ref 6.0–8.5)

## 2018-09-13 LAB — LIPID PANEL
Chol/HDL Ratio: 3.9 ratio (ref 0.0–5.0)
Cholesterol, Total: 191 mg/dL (ref 100–199)
HDL: 49 mg/dL (ref >39)
LDL Calculated: 78 mg/dL (ref 0–99)
Triglycerides: 320 mg/dL — ABNORMAL HIGH (ref 0–149)
VLDL Cholesterol Cal: 64 mg/dL — ABNORMAL HIGH (ref 5–40)

## 2018-09-13 LAB — CBC WITH DIFFERENTIAL/PLATELET
Basophils Absolute: 0 10*3/uL (ref 0.0–0.2)
Basos: 1 %
EOS (ABSOLUTE): 0.1 10*3/uL (ref 0.0–0.4)
Eos: 1 %
Hematocrit: 42.2 % (ref 37.5–51.0)
Hemoglobin: 14.1 g/dL (ref 13.0–17.7)
Immature Grans (Abs): 0 10*3/uL (ref 0.0–0.1)
Immature Granulocytes: 0 %
Lymphocytes Absolute: 2.2 10*3/uL (ref 0.7–3.1)
Lymphs: 31 %
MCH: 30.5 pg (ref 26.6–33.0)
MCHC: 33.4 g/dL (ref 31.5–35.7)
MCV: 91 fL (ref 79–97)
Monocytes Absolute: 0.6 10*3/uL (ref 0.1–0.9)
Monocytes: 9 %
Neutrophils Absolute: 4 10*3/uL (ref 1.4–7.0)
Neutrophils: 58 %
Platelets: 251 10*3/uL (ref 150–450)
RBC: 4.63 x10E6/uL (ref 4.14–5.80)
RDW: 13.4 % (ref 11.6–15.4)
WBC: 7 10*3/uL (ref 3.4–10.8)

## 2018-10-21 ENCOUNTER — Other Ambulatory Visit: Payer: Self-pay | Admitting: Family Medicine

## 2018-10-21 DIAGNOSIS — E119 Type 2 diabetes mellitus without complications: Secondary | ICD-10-CM

## 2018-12-08 ENCOUNTER — Other Ambulatory Visit: Payer: Self-pay | Admitting: Family Medicine

## 2018-12-08 DIAGNOSIS — E1159 Type 2 diabetes mellitus with other circulatory complications: Secondary | ICD-10-CM

## 2018-12-18 ENCOUNTER — Ambulatory Visit (INDEPENDENT_AMBULATORY_CARE_PROVIDER_SITE_OTHER): Payer: Medicare Other | Admitting: Family Medicine

## 2018-12-18 ENCOUNTER — Encounter: Payer: Self-pay | Admitting: Family Medicine

## 2018-12-18 DIAGNOSIS — E785 Hyperlipidemia, unspecified: Secondary | ICD-10-CM

## 2018-12-18 DIAGNOSIS — E119 Type 2 diabetes mellitus without complications: Secondary | ICD-10-CM

## 2018-12-18 DIAGNOSIS — E1169 Type 2 diabetes mellitus with other specified complication: Secondary | ICD-10-CM

## 2018-12-18 DIAGNOSIS — I10 Essential (primary) hypertension: Secondary | ICD-10-CM | POA: Diagnosis not present

## 2018-12-18 DIAGNOSIS — I152 Hypertension secondary to endocrine disorders: Secondary | ICD-10-CM

## 2018-12-18 DIAGNOSIS — E1159 Type 2 diabetes mellitus with other circulatory complications: Secondary | ICD-10-CM | POA: Diagnosis not present

## 2018-12-18 MED ORDER — METFORMIN HCL 500 MG PO TABS: 500.0000 mg | ORAL_TABLET | Freq: Two times a day (BID) | ORAL | 3 refills | Status: DC

## 2018-12-18 MED ORDER — PRAVASTATIN SODIUM 20 MG PO TABS: 20.0000 mg | ORAL_TABLET | Freq: Every day | ORAL | 3 refills | Status: DC

## 2018-12-18 MED ORDER — AMLODIPINE BESYLATE 5 MG PO TABS: 5.0000 mg | ORAL_TABLET | Freq: Every day | ORAL | 3 refills | Status: DC

## 2018-12-18 MED ORDER — IRBESARTAN 150 MG PO TABS: 150.0000 mg | ORAL_TABLET | Freq: Every day | ORAL | 3 refills | Status: DC

## 2018-12-18 NOTE — Progress Notes (Signed)
Virtual Visit via telephone Note  I connected with Jeffrey Rubio on 12/18/18 at 0847 by telephone and verified that I am speaking with the correct person using two identifiers. Jeffrey Rubio is currently located at home and wife are currently with her during visit. The provider, Fransisca Kaufmann Dettinger, MD is located in their office at time of visit.  Call ended at [phone]  I discussed the limitations, risks, security and privacy concerns of performing an evaluation and management service by telephone and the availability of in person appointments. I also discussed with the patient that there may be a patient responsible charge related to this service. The patient expressed understanding and agreed to proceed.   History and Present Illness: Type 2 diabetes mellitus Patient comes in today for recheck of his diabetes. Patient has been currently taking metformin and bs is 170. Patient is currently on an ACE inhibitor/ARB. Patient has seen an ophthalmologist this year. Patient denies any issues with their feet.   Hypertension Patient is currently on amlodipine and avapro, and their blood pressure today is unknown. Patient denies any lightheadedness or dizziness. Patient denies headaches, blurred vision, chest pains, shortness of breath, or weakness. Denies any side effects from medication and is content with current medication.   Hyperlipidemia Patient is coming in for recheck of his hyperlipidemia. The patient is currently taking pravastatin every other day and fish oils. They deny any issues with myalgias or history of liver damage from it. They deny any focal numbness or weakness or chest pain.   No diagnosis found.  Outpatient Encounter Medications as of 12/18/2018  Medication Sig  . acetaminophen (TYLENOL) 500 MG tablet Take 500 mg by mouth every 6 (six) hours as needed for mild pain or moderate pain.  Marland Kitchen amLODipine (NORVASC) 5 MG tablet TAKE 1 TABLET DAILY  . aspirin 81 MG tablet Take  81 mg by mouth daily.  . Blood Glucose Monitoring Suppl (ACCU-CHEK AVIVA PLUS) w/Device KIT USE to check blood sugars daily  . Cholecalciferol (VITAMIN D3) 2000 units TABS Take 4,000 Units by mouth every morning.  Marland Kitchen glucose blood (ACCU-CHEK AVIVA PLUS) test strip USE to check blood sugars daily  . irbesartan (AVAPRO) 150 MG tablet TAKE 2 TABLETS (300 MG     TOTAL) DAILY  . Lancets (ACCU-CHEK SOFT TOUCH) lancets Check BS daily  . magnesium gluconate (MAGONATE) 500 MG tablet Take 500 mg by mouth every other day.   . meclizine (ANTIVERT) 25 MG tablet Take 1 tablet (25 mg total) by mouth 3 (three) times daily as needed for dizziness.  . metFORMIN (GLUCOPHAGE) 500 MG tablet TAKE 1 TABLET BY MOUTH TWO TIMES DAILY WITH MEALS. RESUME 01/13/17.  . Misc Natural Products (EQL SUPER OMEGA-3) CAPS Take 4 capsules by mouth daily. 2 capsules in morning and 2 capsules in the evening  . pravastatin (PRAVACHOL) 20 MG tablet Take 1 tablet (20 mg total) by mouth daily.  . Psyllium (METAMUCIL MULTIHEALTH FIBER PO) Take 2 packets by mouth daily before supper.   . traMADol (ULTRAM) 50 MG tablet Take 1 tablet (50 mg total) by mouth every 12 (twelve) hours as needed.  . vitamin B-12 (CYANOCOBALAMIN) 1000 MCG tablet Take 1,000 mcg by mouth every other day.  . zinc gluconate 50 MG tablet Take 50 mg by mouth daily.     No facility-administered encounter medications on file as of 12/18/2018.     Review of Systems  Constitutional: Negative for chills and fever.  Respiratory: Negative for shortness  of breath and wheezing.   Cardiovascular: Negative for chest pain and leg swelling.  Musculoskeletal: Negative for back pain and gait problem.  Skin: Negative for rash.  Neurological: Negative for dizziness, weakness and light-headedness.  All other systems reviewed and are negative.   Observations/Objective: Patient sounds comfortable and in no acute distress  Assessment and Plan: Problem List Items Addressed This Visit       Cardiovascular and Mediastinum   Hypertension associated with diabetes ([place])   Relevant Medications   irbesartan (AVAPRO) 150 MG tablet   amLODipine (NORVASC) 5 MG tablet   pravastatin (PRAVACHOL) 20 MG tablet   metFORMIN (GLUCOPHAGE) 500 MG tablet     Endocrine   Hyperlipidemia associated with type 2 diabetes mellitus ([facility])   Relevant Medications   irbesartan (AVAPRO) 150 MG tablet   pravastatin (PRAVACHOL) 20 MG tablet   metFORMIN (GLUCOPHAGE) 500 MG tablet   DM (diabetes mellitus) ([place]) - Primary   Relevant Medications   irbesartan (AVAPRO) 150 MG tablet   pravastatin (PRAVACHOL) 20 MG tablet   metFORMIN (GLUCOPHAGE) 500 MG tablet       Follow Up Instructions: Follow up in 3 months  Continue current medication, he did have a blood pressure check today but it sounds like his blood sugars are doing okay, his last A1c was 6.5 and we will reevaluate blood work in 3 months from now.   I discussed the assessment and treatment plan with the patient. The patient was provided an opportunity to ask questions and all were answered. The patient agreed with the plan and demonstrated an understanding of the instructions.   The patient was advised to call back or seek an in-person evaluation if the symptoms worsen or if the condition fails to improve as anticipated.  The above assessment and management plan was discussed with the patient. The patient verbalized understanding of and has agreed to the management plan. Patient is aware to call the clinic if symptoms persist or worsen. Patient is aware when to return to the clinic for a follow-up visit. Patient educated on when it is appropriate to go to the emergency department.    I provided 12 minutes of non-face-to-face time during this encounter.    Worthy Rancher, MD

## 2019-01-02 ENCOUNTER — Other Ambulatory Visit: Payer: Self-pay

## 2019-01-02 ENCOUNTER — Encounter: Payer: Medicare Other | Attending: Family Medicine | Admitting: Nutrition

## 2019-01-02 ENCOUNTER — Encounter: Payer: Self-pay | Admitting: Nutrition

## 2019-01-02 DIAGNOSIS — E119 Type 2 diabetes mellitus without complications: Secondary | ICD-10-CM | POA: Insufficient documentation

## 2019-01-02 NOTE — Patient Instructions (Signed)
Goals  Follow MY Plate  Eat three meals at times discussed.  Drink  only water  Cut out snacks  Follow medication guidelines

## 2019-01-02 NOTE — Progress Notes (Signed)
Diabetes Self-Management Education  Visit Type:  [facility] followup DM visit  Appt. Start Time: 930 Appt. End Time: 1000  01/02/2019 Phone visit.    [name], identified by name and date of birth, is a 73 y.o. male with a diagnosis of Diabetes:  . Wt stables.Doing well. BS stable. 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 A1C 6.5% was in June 2020. Metformin 500 mg BID.  TG elevated at 320 mg/dl. Has recently cut out snacks. Feels better. Not able to walk a lot due to issues with his legs. Willing to start walking a little more now.   Lab Results  Component Value Date   HGBA1C 6.5 09/12/2018    CMP Latest Ref Rng & Units 09/12/2018 03/13/2018 12/07/2017  Glucose 65 - 99 mg/dL 150(H) 146(H) 153(H)  BUN 8 - 27 mg/dL 30(H) 14 23  Creatinine 0.76 - 1.27 mg/dL 1.09 1.23 1.19  Sodium 134 - 144 mmol/L 140 145(H) 141  Potassium 3.5 - 5.2 mmol/L 4.9 4.8 5.3(H)  Chloride 96 - 106 mmol/L 104 105 105  CO2 20 - 29 mmol/L 21 23 23   Calcium 8.6 - 10.2 mg/dL 9.9 9.8 9.9  Total Protein 6.0 - 8.5 g/dL 6.9 6.4 -  Total Bilirubin 0.0 - 1.2 mg/dL 0.3 0.3 -  Alkaline Phos 39 - 117 IU/L 78 76 -  AST 0 - 40 IU/L 12 19 -  ALT 0 - 44 IU/L 19 20 -   Lipid Panel     Component Value Date/Time   CHOL 191 09/12/2018 1059   CHOL 161 10/22/2012 1032   TRIG 320 (H) 09/12/2018 1059   TRIG 211 (H) 06/03/2013 0925   TRIG 175 (H) 10/22/2012 1032   HDL 49 09/12/2018 1059   HDL 45 06/03/2013 0925   HDL 46 10/22/2012 1032   CHOLHDL 3.9 09/12/2018 1059   LDLCALC 78 09/12/2018 1059   LDLCALC 63 06/03/2013 0925   LDLCALC 80 10/22/2012 1032   LABVLDL 64 (H) 09/12/2018 1059     ASSESSMENT  Height 5\' 7"  (1.702 m), weight 178 lb (80.7 kg). Body mass index is 27.88 kg/m.  Vitals with BMI 01/01/2018  Height 5\' 7"   Weight 172 lbs  BMI 0000000  Systolic   Diastolic   Pulse   Respirations    B) eggs, 1 slice white bread, bacon, 2, 6 oz milk and fruit L) Pork  chop sandwich,  Spaghetti, fruit, water D Hamburger bun ,and apple, pinto beans, slaw,water   Individualized Plan for Diabetes Self-Management Training:   Learning Objective:  Patient will have a greater understanding of diabetes self-management. Patient education plan is to attend individual and/or group sessions per assessed needs and concerns.  Goals  Follow MY Plate  Eat three meals at times discussed.  Drink  only water  Cut out snacks  Follow medication guidelines    Expected Outcomes:   IMproved BS   Education material provided: My Plate and Carbohydrate counting sheet  If problems or questions, patient to contact team via:  Phone and Email  Future DSME appointment:  6 months.

## 2019-01-09 ENCOUNTER — Encounter: Payer: Self-pay | Admitting: Nutrition

## 2019-01-22 DIAGNOSIS — Z23 Encounter for immunization: Secondary | ICD-10-CM | POA: Diagnosis not present

## 2019-03-19 ENCOUNTER — Other Ambulatory Visit: Payer: Self-pay

## 2019-03-20 ENCOUNTER — Ambulatory Visit (INDEPENDENT_AMBULATORY_CARE_PROVIDER_SITE_OTHER): Payer: Medicare Other | Admitting: Family Medicine

## 2019-03-20 ENCOUNTER — Other Ambulatory Visit: Payer: Self-pay

## 2019-03-20 ENCOUNTER — Encounter: Payer: Self-pay | Admitting: Family Medicine

## 2019-03-20 VITALS — BP 147/79 | HR 61 | Temp 98.4°F | Ht 67.0 in | Wt 179.6 lb

## 2019-03-20 DIAGNOSIS — E1169 Type 2 diabetes mellitus with other specified complication: Secondary | ICD-10-CM | POA: Diagnosis not present

## 2019-03-20 DIAGNOSIS — E785 Hyperlipidemia, unspecified: Secondary | ICD-10-CM | POA: Diagnosis not present

## 2019-03-20 DIAGNOSIS — E1159 Type 2 diabetes mellitus with other circulatory complications: Secondary | ICD-10-CM | POA: Diagnosis not present

## 2019-03-20 DIAGNOSIS — I10 Essential (primary) hypertension: Secondary | ICD-10-CM

## 2019-03-20 DIAGNOSIS — I152 Hypertension secondary to endocrine disorders: Secondary | ICD-10-CM

## 2019-03-20 LAB — BAYER DCA HB A1C WAIVED: HB A1C (BAYER DCA - WAIVED): 8.1 % — ABNORMAL HIGH (ref <7.0)

## 2019-03-20 MED ORDER — AMLODIPINE BESYLATE 5 MG PO TABS: 5.0000 mg | ORAL_TABLET | Freq: Every day | ORAL | 0 refills | Status: DC

## 2019-03-20 NOTE — Progress Notes (Signed)
BP (!) 147/79   Pulse 61   Temp 98.4 F (36.9 C) (Temporal)   Ht _0  (1.702 m)   Wt 179 lb 9.6 oz (81.5 kg)   SpO2 97%   BMI 28.13 kg/m    Subjective:   Patient ID: Jeffrey Rubio, male    DOB: 08-19-1945, 73 y.o.   MRN: [id number]  HPI: Jeffrey Rubio is a 73 y.o. male presenting on 03/20/2019 for Diabetes   HPI Type 2 diabetes mellitus Patient comes in today for recheck of his diabetes. Patient has been currently taking metformin 500, A1c is up at 8.1. Patient is currently on an ACE inhibitor/ARB. Patient has not seen an ophthalmologist this year. Patient denies any issues with their feet.  Has associated hypertension cholesterol  Hypertension Patient is currently on irbesartan and amlodipine, and their blood pressure today is 147/79. Patient denies any lightheadedness or dizziness. Patient denies headaches, blurred vision, chest pains, shortness of breath, or weakness. Denies any side effects from medication and is content with current medication.   Hyperlipidemia Patient is coming in for recheck of his hyperlipidemia. The patient is currently taking pravastatin. They deny any issues with myalgias or history of liver damage from it. They deny any focal numbness or weakness or chest pain.   Relevant past medical, surgical, family and social history reviewed and updated as indicated. Interim medical history since our last visit reviewed. Allergies and medications reviewed and updated.  Review of Systems  Constitutional: Negative for chills and fever.  Eyes: Negative for visual disturbance.  Respiratory: Negative for shortness of breath and wheezing.   Cardiovascular: Negative for chest pain and leg swelling.  Musculoskeletal: Negative for back pain and gait problem.  Skin: Negative for rash.  Neurological: Negative for dizziness, weakness and numbness.  All other systems reviewed and are negative.   Per HPI unless specifically indicated above   Allergies as  of 03/20/2019      Reactions   Vilazodone Hcl    Other reaction(s): Other      Medication List       Accurate as of March 20, 2019  8:49 AM. If you have any questions, ask your nurse or doctor.        Accu-Chek Aviva Plus w/Device Kit USE to check blood sugars daily   accu-chek soft touch lancets Check BS daily   acetaminophen 500 MG tablet Commonly known as: TYLENOL Take 500 mg by mouth every 6 (six) hours as needed for mild pain or moderate pain.   amLODipine 5 MG tablet Commonly known as: NORVASC Take 1 tablet (5 mg total) by mouth daily. What changed: Another medication with the same name was added. Make sure you understand how and when to take each. Changed by: Fransisca Kaufmann [name], MD   amLODipine 5 MG tablet Commonly known as: NORVASC Take 1 tablet (5 mg total) by mouth daily. What changed: You were already taking a medication with the same name, and this prescription was added. Make sure you understand how and when to take each. Changed by: Fransisca Kaufmann [name], MD   aspirin 81 MG tablet Take 81 mg by mouth daily.   EQL Super Omega-3 Caps Take 4 capsules by mouth daily. 2 capsules in morning and 2 capsules in the evening   glucose blood test strip Commonly known as: Accu-Chek Aviva Plus USE to check blood sugars daily   irbesartan 150 MG tablet Commonly known as: AVAPRO Take 1 tablet (150 mg total) by mouth  daily.   magnesium gluconate 500 MG tablet Commonly known as: MAGONATE Take 500 mg by mouth every other day.   meclizine 25 MG tablet Commonly known as: ANTIVERT Take 1 tablet (25 mg total) by mouth 3 (three) times daily as needed for dizziness.   METAMUCIL MULTIHEALTH FIBER PO Take 2 packets by mouth daily before supper.   metFORMIN 500 MG tablet Commonly known as: GLUCOPHAGE Take 1 tablet (500 mg total) by mouth 2 (two) times daily with a meal.   pravastatin 20 MG tablet Commonly known as: PRAVACHOL Take 1 tablet (20 mg total) by mouth  daily.   traMADol 50 MG tablet Commonly known as: ULTRAM Take 1 tablet (50 mg total) by mouth every 12 (twelve) hours as needed.   vitamin B-12 1000 MCG tablet Commonly known as: CYANOCOBALAMIN Take 1,000 mcg by mouth every other day.   Vitamin D3 50 MCG (2000 UT) Tabs Take 4,000 Units by mouth every morning.   zinc gluconate 50 MG tablet Take 50 mg by mouth daily.        Objective:   BP (!) 147/79   Pulse 61   Temp 98.4 F (36.9 C) (Temporal)   Ht _0  (1.702 m)   Wt 179 lb 9.6 oz (81.5 kg)   SpO2 97%   BMI 28.13 kg/m   Wt Readings from Last 3 Encounters:  03/20/19 179 lb 9.6 oz (81.5 kg)  01/02/19 178 lb (80.7 kg)  09/12/18 173 lb 12.8 oz (78.8 kg)    Physical Exam Vitals and nursing note reviewed.  Constitutional:      General: He is not in acute distress.    Appearance: He is well-developed. He is not diaphoretic.  Eyes:     General: No scleral icterus.    Conjunctiva/sclera: Conjunctivae normal.  Neck:     Thyroid: No thyromegaly.  Cardiovascular:     Rate and Rhythm: Normal rate and regular rhythm.     Heart sounds: Normal heart sounds. No murmur.  Pulmonary:     Effort: Pulmonary effort is normal. No respiratory distress.     Breath sounds: Normal breath sounds. No wheezing.  Musculoskeletal:        General: Normal range of motion.     Cervical back: Neck supple.  Lymphadenopathy:     Cervical: No cervical adenopathy.  Skin:    General: Skin is warm and dry.     Findings: No rash.  Neurological:     Mental Status: He is alert and oriented to person, place, and time.     Coordination: Coordination normal.  Psychiatric:        Behavior: Behavior normal.     Results for orders placed or performed in visit on 09/12/18  Bayer DCA Hb A1c Waived  Result Value Ref Range   HB A1C (BAYER DCA - WAIVED) 6.5 <7.0 %  CBC with Differential/Platelet  Result Value Ref Range   WBC 7.0 3.4 - 10.8 x10E3/uL   RBC 4.63 4.14 - 5.80 x10E6/uL   Hemoglobin  14.1 13.0 - 17.7 g/dL   Hematocrit 42.2 37.5 - 51.0 %   MCV 91 79 - 97 fL   MCH 30.5 26.6 - 33.0 pg   MCHC 33.4 31.5 - 35.7 g/dL   RDW 13.4 11.6 - 15.4 %   Platelets 251 150 - 450 x10E3/uL   Neutrophils 58 Not Estab. %   Lymphs 31 Not Estab. %   Monocytes 9 Not Estab. %   Eos 1 Not Estab. %  Basos 1 Not Estab. %   Neutrophils Absolute 4.0 1.4 - 7.0 x10E3/uL   Lymphocytes Absolute 2.2 0.7 - 3.1 x10E3/uL   Monocytes Absolute 0.6 0.1 - 0.9 x10E3/uL   EOS (ABSOLUTE) 0.1 0.0 - 0.4 x10E3/uL   Basophils Absolute 0.0 0.0 - 0.2 x10E3/uL   Immature Granulocytes 0 Not Estab. %   Immature Grans (Abs) 0.0 0.0 - 0.1 x10E3/uL  CMP14+EGFR  Result Value Ref Range   Glucose 150 (H) 65 - 99 mg/dL   BUN 30 (H) 8 - 27 mg/dL   Creatinine, Ser 1.09 0.76 - 1.27 mg/dL   GFR calc non Af Amer 67 >59 mL/min/1.73   GFR calc Af Amer 78 >59 mL/min/1.73   BUN/Creatinine Ratio 28 (H) 10 - 24   Sodium 140 134 - 144 mmol/L   Potassium 4.9 3.5 - 5.2 mmol/L   Chloride 104 96 - 106 mmol/L   CO2 21 20 - 29 mmol/L   Calcium 9.9 8.6 - 10.2 mg/dL   Total Protein 6.9 6.0 - 8.5 g/dL   Albumin 4.4 3.7 - 4.7 g/dL   Globulin, Total 2.5 1.5 - 4.5 g/dL   Albumin/Globulin Ratio 1.8 1.2 - 2.2   Bilirubin Total 0.3 0.0 - 1.2 mg/dL   Alkaline Phosphatase 78 39 - 117 IU/L   AST 12 0 - 40 IU/L   ALT 19 0 - 44 IU/L  Lipid panel  Result Value Ref Range   Cholesterol, Total 191 100 - 199 mg/dL   Triglycerides 320 (H) 0 - 149 mg/dL   HDL 49 >39 mg/dL   VLDL Cholesterol Cal 64 (H) 5 - 40 mg/dL   LDL Calculated 78 0 - 99 mg/dL   Chol/HDL Ratio 3.9 0.0 - 5.0 ratio    Assessment & Plan:   Problem List Items Addressed This Visit      Cardiovascular and Mediastinum   Hypertension associated with diabetes ([place])   Relevant Medications   amLODipine (NORVASC) 5 MG tablet     Endocrine   Hyperlipidemia associated with type 2 diabetes mellitus (HCC)   DM (diabetes mellitus) ([place]) - Primary   Relevant Orders   Microalbumin  / creatinine urine ratio   hgba1c      Continue current medication, he had run out of amlodipine so sent a 2-week course until his mail order can ship it to him. Follow up plan: Return in about 3 months (around 06/18/2019), or if symptoms worsen or fail to improve, for Diabetes recheck.  Counseling provided for all of the vaccine components Orders Placed This Encounter  Procedures  . Microalbumin / creatinine urine ratio  . hgba1c    Caryl Pina, MD [facility] 03/20/2019, 8:49 AM

## 2019-03-21 LAB — MICROALBUMIN / CREATININE URINE RATIO
Creatinine, Urine: 99.6 mg/dL
Microalb/Creat Ratio: 46 mg/g creat — ABNORMAL HIGH (ref 0–29)
Microalbumin, Urine: 45.4 ug/mL

## 2019-03-24 ENCOUNTER — Telehealth: Payer: Self-pay | Admitting: Family Medicine

## 2019-03-24 MED ORDER — IRBESARTAN 150 MG PO TABS: 150.0000 mg | ORAL_TABLET | Freq: Two times a day (BID) | ORAL | 3 refills | Status: DC

## 2019-03-24 NOTE — Telephone Encounter (Signed)
Wife states that patient was taking irbesartan 300mg  daily and had to get switched to 150mg  BID due to being out of stock. States the rx he just got says to take 150mg  daily. Please advise what patient should be taking.

## 2019-03-24 NOTE — Telephone Encounter (Signed)
Sent refill for patient with corrected twice daily.

## 2019-03-24 NOTE — Addendum Note (Signed)
Addended by: Caryl Pina on: 03/24/2019 10:33 AM   Modules accepted: Orders

## 2019-03-30 ENCOUNTER — Other Ambulatory Visit: Payer: Self-pay | Admitting: Family Medicine

## 2019-03-30 DIAGNOSIS — E1159 Type 2 diabetes mellitus with other circulatory complications: Secondary | ICD-10-CM

## 2019-03-30 DIAGNOSIS — I152 Hypertension secondary to endocrine disorders: Secondary | ICD-10-CM

## 2019-03-31 ENCOUNTER — Other Ambulatory Visit: Payer: Self-pay | Admitting: Family Medicine

## 2019-03-31 DIAGNOSIS — I152 Hypertension secondary to endocrine disorders: Secondary | ICD-10-CM

## 2019-03-31 DIAGNOSIS — E1159 Type 2 diabetes mellitus with other circulatory complications: Secondary | ICD-10-CM

## 2019-03-31 NOTE — Telephone Encounter (Signed)
Pt's wife called stating that [facility] is going to send Dr Dettinger a fax requesting another refill on Amlodipine Rx because patient still has not received it. Says the original was supposed to be a 90 day supply but says it cant be found so they are going to reissue another 90 day supply. Wife says she has already called and worked everything out with pharmacy and insurance. Wife says pt just needs a 14 day supply to take to local pharmacy which will last patient until his 18 supply comes in.

## 2019-04-01 NOTE — Progress Notes (Signed)
Left message to please call our office. 

## 2019-05-05 DIAGNOSIS — Z23 Encounter for immunization: Secondary | ICD-10-CM | POA: Diagnosis not present

## 2019-05-06 ENCOUNTER — Encounter: Payer: Self-pay | Admitting: Family Medicine

## 2019-05-06 ENCOUNTER — Ambulatory Visit (INDEPENDENT_AMBULATORY_CARE_PROVIDER_SITE_OTHER): Payer: Medicare Other | Admitting: Family Medicine

## 2019-05-06 DIAGNOSIS — J358 Other chronic diseases of tonsils and adenoids: Secondary | ICD-10-CM

## 2019-05-06 DIAGNOSIS — J039 Acute tonsillitis, unspecified: Secondary | ICD-10-CM | POA: Diagnosis not present

## 2019-05-06 MED ORDER — AMOXICILLIN-POT CLAVULANATE 875-125 MG PO TABS: 1.0000 | ORAL_TABLET | Freq: Two times a day (BID) | ORAL | 0 refills | Status: DC

## 2019-05-06 NOTE — Progress Notes (Signed)
Virtual Visit via telephone Note  I connected with Jeffrey Rubio on 05/06/19 at 1300 by telephone and verified that I am speaking with the correct person using two identifiers. Shana Younge is currently located at home and wife  are currently with her during visit. The provider, Fransisca Kaufmann Dettinger, MD is located in their office at time of visit.  Call ended at 1309  I discussed the limitations, risks, security and privacy concerns of performing an evaluation and management service by telephone and the availability of in person appointments. I also discussed with the patient that there may be a patient responsible charge related to this service. The patient expressed understanding and agreed to proceed.   History and Present Illness: Patient is having a soreness in the back of his throat and it is not getting better.  He has the sore throat and swollen left tonsil with stones.  He used sallt water and gargled. No fevers, but has body aches. He has a mild chronic cough but it is no worse.  He removed tonsil stones but it is still swollen. He has been fighting this off and on for 6 months and it is worse recently.  He is using tylenol and lozenges. These things help but do not clear it.   No diagnosis found.  Outpatient Encounter Medications as of 05/06/2019  Medication Sig  . acetaminophen (TYLENOL) 500 MG tablet Take 500 mg by mouth every 6 (six) hours as needed for mild pain or moderate pain.  Marland Kitchen amLODipine (NORVASC) 5 MG tablet Take 1 tablet (5 mg total) by mouth daily.  Marland Kitchen amLODipine (NORVASC) 5 MG tablet TAKE 1 TABLET BY MOUTH EVERY DAY  . aspirin 81 MG tablet Take 81 mg by mouth daily.  . Blood Glucose Monitoring Suppl (ACCU-CHEK AVIVA PLUS) w/Device KIT USE to check blood sugars daily  . Cholecalciferol (VITAMIN D3) 2000 units TABS Take 4,000 Units by mouth every morning.  Marland Kitchen glucose blood (ACCU-CHEK AVIVA PLUS) test strip USE to check blood sugars daily  . irbesartan (AVAPRO)  150 MG tablet Take 1 tablet (150 mg total) by mouth 2 (two) times daily.  . Lancets (ACCU-CHEK SOFT TOUCH) lancets Check BS daily  . magnesium gluconate (MAGONATE) 500 MG tablet Take 500 mg by mouth every other day.   . meclizine (ANTIVERT) 25 MG tablet Take 1 tablet (25 mg total) by mouth 3 (three) times daily as needed for dizziness.  . metFORMIN (GLUCOPHAGE) 500 MG tablet Take 1 tablet (500 mg total) by mouth 2 (two) times daily with a meal.  . Misc Natural Products (EQL SUPER OMEGA-3) CAPS Take 4 capsules by mouth daily. 2 capsules in morning and 2 capsules in the evening  . pravastatin (PRAVACHOL) 20 MG tablet Take 1 tablet (20 mg total) by mouth daily.  . Psyllium (METAMUCIL MULTIHEALTH FIBER PO) Take 2 packets by mouth daily before supper.   . traMADol (ULTRAM) 50 MG tablet Take 1 tablet (50 mg total) by mouth every 12 (twelve) hours as needed.  . vitamin B-12 (CYANOCOBALAMIN) 1000 MCG tablet Take 1,000 mcg by mouth every other day.  . zinc gluconate 50 MG tablet Take 50 mg by mouth daily.     No facility-administered encounter medications on file as of 05/06/2019.    Review of Systems  Constitutional: Negative for chills and fever.  HENT: Positive for sore throat. Negative for congestion, ear discharge, ear pain, postnasal drip, rhinorrhea, sinus pressure, sneezing and voice change.   Eyes: Negative  for pain, discharge, redness and visual disturbance.  Respiratory: Positive for cough. Negative for shortness of breath and wheezing.   Cardiovascular: Negative for chest pain and leg swelling.  Musculoskeletal: Positive for myalgias. Negative for gait problem.  Skin: Negative for rash.  All other systems reviewed and are negative.   Observations/Objective: Patient sounds comfortable and in no acute distres  Assessment and Plan: Problem List Items Addressed This Visit    None    Visit Diagnoses    Tonsillitis    -  Primary   Relevant Medications   amoxicillin-clavulanate  (AUGMENTIN) 875-125 MG tablet   Tonsil stone          Will treat for tonsillitis possible strep, if not improved after this course then we will likely refer him to ENT Follow up plan: Return if symptoms worsen or fail to improve.     I discussed the assessment and treatment plan with the patient. The patient was provided an opportunity to ask questions and all were answered. The patient agreed with the plan and demonstrated an understanding of the instructions.   The patient was advised to call back or seek an in-person evaluation if the symptoms worsen or if the condition fails to improve as anticipated.  The above assessment and management plan was discussed with the patient. The patient verbalized understanding of and has agreed to the management plan. Patient is aware to call the clinic if symptoms persist or worsen. Patient is aware when to return to the clinic for a follow-up visit. Patient educated on when it is appropriate to go to the emergency department.    I provided 9 minutes of non-face-to-face time during this encounter.    Worthy Rancher, MD

## 2019-05-27 DIAGNOSIS — H26493 Other secondary cataract, bilateral: Secondary | ICD-10-CM | POA: Diagnosis not present

## 2019-05-27 DIAGNOSIS — E119 Type 2 diabetes mellitus without complications: Secondary | ICD-10-CM | POA: Diagnosis not present

## 2019-05-27 LAB — HM DIABETES EYE EXAM

## 2019-06-02 ENCOUNTER — Telehealth: Payer: Self-pay | Admitting: Family Medicine

## 2019-06-02 DIAGNOSIS — Z23 Encounter for immunization: Secondary | ICD-10-CM | POA: Diagnosis not present

## 2019-06-02 DIAGNOSIS — E1169 Type 2 diabetes mellitus with other specified complication: Secondary | ICD-10-CM

## 2019-06-02 NOTE — Telephone Encounter (Signed)
  REFERRAL REQUEST Telephone Note 06/02/2019  What type of referral do you need? [facility]  Have you been seen at our office for this problem? Yes Pamala Hurry from [facility] called and stated this Patient's Ref is expired and their office will need new ref to continue seeing patient (Advise that they may need an appointment with their PCP before a referral can be done)  Is there a particular doctor or location that you prefer? [facility]   Patient notified that referrals can take up to a week or longer to process. If they haven't heard anything within a week they should call back and speak with the referral department.

## 2019-06-03 ENCOUNTER — Ambulatory Visit: Payer: Medicare Other | Admitting: Nutrition

## 2019-06-03 NOTE — Telephone Encounter (Signed)
Placed referral for the patient. 

## 2019-06-10 ENCOUNTER — Other Ambulatory Visit: Payer: Self-pay | Admitting: Family Medicine

## 2019-06-18 ENCOUNTER — Telehealth: Payer: Self-pay | Admitting: Family Medicine

## 2019-06-18 NOTE — Chronic Care Management (AMB) (Signed)
  Chronic Care Management   Note  06/18/2019 Name: Vanderbilt Ranieri MRN: [id number] DOB: 10/08/1945  Geffrey Michaelsen Homan is a 74 y.o. year old male who is a primary care patient of Dettinger, Fransisca Kaufmann, MD. I reached out to [name] by phone today in response to a referral sent by Mr. [facility] health plan.     Mr. Withey was given information about Chronic Care Management services today including:  1. CCM service includes personalized support from designated clinical staff supervised by his physician, including individualized plan of care and coordination with other care providers 2. 24/7 contact phone numbers for assistance for urgent and routine care needs. 3. Service will only be billed when office clinical staff spend 20 minutes or more in a month to coordinate care. 4. Only one practitioner may furnish and bill the service in a calendar month. 5. The patient may stop CCM services at any time (effective at the end of the month) by phone call to the office staff. 6. The patient will be responsible for cost sharing (co-pay) of up to 20% of the service fee (after annual deductible is met).  Patient agreed to services and verbal consent obtained.   Follow up plan: Telephone appointment with care management team member scheduled for:10/20/2019  Noreene Larsson, [address], [place], [place] [address] Direct Dial: [phone] [email or web address] Website: [email or web address]

## 2019-06-19 DIAGNOSIS — Z86010 Personal history of colonic polyps: Secondary | ICD-10-CM | POA: Diagnosis not present

## 2019-06-19 DIAGNOSIS — Z1211 Encounter for screening for malignant neoplasm of colon: Secondary | ICD-10-CM | POA: Diagnosis not present

## 2019-06-19 DIAGNOSIS — K633 Ulcer of intestine: Secondary | ICD-10-CM | POA: Diagnosis not present

## 2019-06-19 DIAGNOSIS — K6389 Other specified diseases of intestine: Secondary | ICD-10-CM | POA: Diagnosis not present

## 2019-06-19 DIAGNOSIS — K648 Other hemorrhoids: Secondary | ICD-10-CM | POA: Diagnosis not present

## 2019-06-19 DIAGNOSIS — K5730 Diverticulosis of large intestine without perforation or abscess without bleeding: Secondary | ICD-10-CM | POA: Diagnosis not present

## 2019-06-20 ENCOUNTER — Other Ambulatory Visit: Payer: Self-pay

## 2019-06-20 ENCOUNTER — Encounter: Payer: Self-pay | Admitting: Family Medicine

## 2019-06-20 ENCOUNTER — Ambulatory Visit (INDEPENDENT_AMBULATORY_CARE_PROVIDER_SITE_OTHER): Payer: Medicare Other | Admitting: Family Medicine

## 2019-06-20 VITALS — BP 133/77 | HR 68 | Temp 98.6°F | Ht 67.0 in | Wt 177.0 lb

## 2019-06-20 DIAGNOSIS — E785 Hyperlipidemia, unspecified: Secondary | ICD-10-CM

## 2019-06-20 DIAGNOSIS — N400 Enlarged prostate without lower urinary tract symptoms: Secondary | ICD-10-CM

## 2019-06-20 DIAGNOSIS — G4733 Obstructive sleep apnea (adult) (pediatric): Secondary | ICD-10-CM

## 2019-06-20 DIAGNOSIS — E1169 Type 2 diabetes mellitus with other specified complication: Secondary | ICD-10-CM | POA: Diagnosis not present

## 2019-06-20 DIAGNOSIS — I10 Essential (primary) hypertension: Secondary | ICD-10-CM | POA: Diagnosis not present

## 2019-06-20 DIAGNOSIS — E1159 Type 2 diabetes mellitus with other circulatory complications: Secondary | ICD-10-CM | POA: Diagnosis not present

## 2019-06-20 LAB — BAYER DCA HB A1C WAIVED: HB A1C (BAYER DCA - WAIVED): 7.7 % — ABNORMAL HIGH (ref <7.0)

## 2019-06-20 MED ORDER — IRBESARTAN 150 MG PO TABS: 150.0000 mg | ORAL_TABLET | Freq: Two times a day (BID) | ORAL | 3 refills | Status: DC

## 2019-06-20 NOTE — Progress Notes (Signed)
BP 133/77   Pulse 68   Temp 98.6 F (37 C)   Ht '5\' 7"'  (1.702 m)   Wt 177 lb (80.3 kg)   SpO2 94%   BMI 27.72 kg/m    Subjective:   Patient ID: Jeffrey Rubio, male    DOB: 20-Mar-1946, 74 y.o.   MRN: [id number]  HPI: Jeffrey Rubio is a 74 y.o. male presenting on 06/20/2019 for Medical Management of Chronic Issues (34m and Diabetes   HPI Type 2 diabetes mellitus Patient comes in today for recheck of his diabetes. Patient has been currently taking Metformin. Patient is currently on an ACE inhibitor/ARB. Patient has seen an ophthalmologist this year. Patient denies any issues with their feet.   Hypertension Patient is currently on amlodipine and irbesartan, and their blood pressure today is 133/77. Patient denies any lightheadedness or dizziness. Patient denies headaches, blurred vision, chest pains, shortness of breath, or weakness. Denies any side effects from medication and is content with current medication.   Hyperlipidemia Patient is coming in for recheck of his hyperlipidemia. The patient is currently taking pravastatin. They deny any issues with myalgias or history of liver damage from it. They deny any focal numbness or weakness or chest pain.   BPH Patient is coming in for recheck on BPH Symptoms: None currently Medication: None Last PSA: 0.6 in 2019  Relevant past medical, surgical, family and social history reviewed and updated as indicated. Interim medical history since our last visit reviewed. Allergies and medications reviewed and updated.  Review of Systems  Constitutional: Negative for chills and fever.  Eyes: Negative for visual disturbance.  Respiratory: Negative for shortness of breath and wheezing.   Cardiovascular: Negative for chest pain and leg swelling.  Musculoskeletal: Negative for back pain and gait problem.  Skin: Negative for rash.  Neurological: Negative for dizziness, weakness and light-headedness.  All other systems reviewed and are  negative.   Per HPI unless specifically indicated above   Allergies as of 06/20/2019      Reactions   Vilazodone Hcl    Other reaction(s): Other      Medication List       Accurate as of June 20, 2019 11:59 PM. If you have any questions, ask your nurse or doctor.        STOP taking these medications   amoxicillin-clavulanate 875-125 MG tablet Commonly known as: AUGMENTIN Stopped by: JFransisca KaufmannDettinger, MD   MAstoriaby: JFransisca KaufmannDettinger, MD     TAKE these medications   Accu-Chek Aviva Plus test strip Generic drug: glucose blood Test BS daily Dx E11.69   Accu-Chek Aviva Plus w/Device Kit USE to check blood sugars daily   accu-chek soft touch lancets Check BS daily   acetaminophen 500 MG tablet Commonly known as: TYLENOL Take 500 mg by mouth every 6 (six) hours as needed for mild pain or moderate pain.   amLODipine 5 MG tablet Commonly known as: NORVASC Take 1 tablet (5 mg total) by mouth daily.   amLODipine 5 MG tablet Commonly known as: NORVASC TAKE 1 TABLET BY MOUTH EVERY DAY   aspirin 81 MG tablet Take 81 mg by mouth daily.   EQL Super Omega-3 Caps Take 4 capsules by mouth daily. 2 capsules in morning and 2 capsules in the evening   irbesartan 150 MG tablet Commonly known as: AVAPRO Take 1 tablet (150 mg total) by mouth 2 (two) times daily.   magnesium gluconate 500 MG tablet  Commonly known as: MAGONATE Take 500 mg by mouth every other day.   meclizine 25 MG tablet Commonly known as: ANTIVERT Take 1 tablet (25 mg total) by mouth 3 (three) times daily as needed for dizziness.   metFORMIN 500 MG tablet Commonly known as: GLUCOPHAGE Take 1 tablet (500 mg total) by mouth 2 (two) times daily with a meal.   pravastatin 20 MG tablet Commonly known as: PRAVACHOL Take 1 tablet (20 mg total) by mouth daily.   PROBIOTIC-10 PO Take by mouth daily.   traMADol 50 MG tablet Commonly known as: ULTRAM Take 1 tablet (50  mg total) by mouth every 12 (twelve) hours as needed.   vitamin B-12 1000 MCG tablet Commonly known as: CYANOCOBALAMIN Take 1,000 mcg by mouth every other day.   Vitamin D3 50 MCG (2000 UT) Tabs Take 4,000 Units by mouth every morning.   zinc gluconate 50 MG tablet Take 50 mg by mouth daily.        Objective:   BP 133/77   Pulse 68   Temp 98.6 F (37 C)   Ht '5\' 7"'  (1.702 m)   Wt 177 lb (80.3 kg)   SpO2 94%   BMI 27.72 kg/m   Wt Readings from Last 3 Encounters:  06/20/19 177 lb (80.3 kg)  03/20/19 179 lb 9.6 oz (81.5 kg)  01/02/19 178 lb (80.7 kg)    Physical Exam Vitals and nursing note reviewed.  Constitutional:      General: He is not in acute distress.    Appearance: He is well-developed. He is not diaphoretic.  Eyes:     General: No scleral icterus.    Conjunctiva/sclera: Conjunctivae normal.  Neck:     Thyroid: No thyromegaly.  Cardiovascular:     Rate and Rhythm: Normal rate and regular rhythm.     Heart sounds: Normal heart sounds. No murmur.  Pulmonary:     Effort: Pulmonary effort is normal. No respiratory distress.     Breath sounds: Normal breath sounds. No wheezing.  Musculoskeletal:        General: Normal range of motion.     Cervical back: Neck supple.  Lymphadenopathy:     Cervical: No cervical adenopathy.  Skin:    General: Skin is warm and dry.     Findings: No rash.  Neurological:     Mental Status: He is alert and oriented to person, place, and time.     Coordination: Coordination normal.  Psychiatric:        Behavior: Behavior normal.       Assessment & Plan:   Problem List Items Addressed This Visit      Cardiovascular and Mediastinum   Hypertension associated with diabetes ([place])     Endocrine   Hyperlipidemia associated with type 2 diabetes mellitus ([place])   Relevant Orders   Lipid panel (Completed)   DM (diabetes mellitus) ([place]) - Primary   Relevant Orders   Bayer DCA Hb A1c Waived (Completed)   CBC with  Differential/Platelet (Completed)   CMP14+EGFR (Completed)     Genitourinary   Benign prostatic hyperplasia   Relevant Orders   PSA, total and free (Completed)   Lipid panel (Completed)    Other Visit Diagnoses    Obstructive sleep apnea syndrome          Patient currently uses sleep apnea machine and is doing well with it. Follow up plan: Return in about 3 months (around 09/20/2019), or if symptoms worsen or fail to improve, for  Diabetes and hypertension recheck.  Counseling provided for all of the vaccine components Orders Placed This Encounter  Procedures  . Bayer DCA Hb A1c Waived  . PSA, total and free  . CBC with Differential/Platelet  . CMP14+EGFR  . Lipid panel    Caryl Pina, MD [facility] 06/26/2019, 9:36 PM

## 2019-06-21 LAB — CMP14+EGFR
ALT: 22 IU/L (ref 0–44)
AST: 14 IU/L (ref 0–40)
Albumin/Globulin Ratio: 1.7 (ref 1.2–2.2)
Albumin: 4.3 g/dL (ref 3.7–4.7)
Alkaline Phosphatase: 97 IU/L (ref 39–117)
BUN/Creatinine Ratio: 12 (ref 10–24)
BUN: 14 mg/dL (ref 8–27)
Bilirubin Total: 0.4 mg/dL (ref 0.0–1.2)
CO2: 23 mmol/L (ref 20–29)
Calcium: 9.6 mg/dL (ref 8.6–10.2)
Chloride: 105 mmol/L (ref 96–106)
Creatinine, Ser: 1.18 mg/dL (ref 0.76–1.27)
GFR calc Af Amer: 70 mL/min/{1.73_m2} (ref >59)
GFR calc non Af Amer: 61 mL/min/{1.73_m2} (ref >59)
Globulin, Total: 2.5 g/dL (ref 1.5–4.5)
Glucose: 171 mg/dL — ABNORMAL HIGH (ref 65–99)
Potassium: 4.5 mmol/L (ref 3.5–5.2)
Sodium: 141 mmol/L (ref 134–144)
Total Protein: 6.8 g/dL (ref 6.0–8.5)

## 2019-06-21 LAB — CBC WITH DIFFERENTIAL/PLATELET
Basophils Absolute: 0 10*3/uL (ref 0.0–0.2)
Basos: 0 %
EOS (ABSOLUTE): 0 10*3/uL (ref 0.0–0.4)
Eos: 0 %
Hematocrit: 41.3 % (ref 37.5–51.0)
Hemoglobin: 13.7 g/dL (ref 13.0–17.7)
Immature Grans (Abs): 0 10*3/uL (ref 0.0–0.1)
Immature Granulocytes: 0 %
Lymphocytes Absolute: 2.1 10*3/uL (ref 0.7–3.1)
Lymphs: 22 %
MCH: 29.8 pg (ref 26.6–33.0)
MCHC: 33.2 g/dL (ref 31.5–35.7)
MCV: 90 fL (ref 79–97)
Monocytes Absolute: 0.7 10*3/uL (ref 0.1–0.9)
Monocytes: 8 %
Neutrophils Absolute: 6.5 10*3/uL (ref 1.4–7.0)
Neutrophils: 70 %
Platelets: 247 10*3/uL (ref 150–450)
RBC: 4.6 x10E6/uL (ref 4.14–5.80)
RDW: 13.3 % (ref 11.6–15.4)
WBC: 9.4 10*3/uL (ref 3.4–10.8)

## 2019-06-21 LAB — PSA, TOTAL AND FREE
PSA, Free Pct: 25 %
PSA, Free: 0.15 ng/mL
Prostate Specific Ag, Serum: 0.6 ng/mL (ref 0.0–4.0)

## 2019-06-21 LAB — LIPID PANEL
Chol/HDL Ratio: 3.4 ratio (ref 0.0–5.0)
Cholesterol, Total: 158 mg/dL (ref 100–199)
HDL: 46 mg/dL (ref >39)
LDL Chol Calc (NIH): 71 mg/dL (ref 0–99)
Triglycerides: 251 mg/dL — ABNORMAL HIGH (ref 0–149)
VLDL Cholesterol Cal: 41 mg/dL — ABNORMAL HIGH (ref 5–40)

## 2019-06-24 ENCOUNTER — Telehealth: Payer: Self-pay | Admitting: *Deleted

## 2019-06-24 NOTE — Telephone Encounter (Signed)
Fax from [facility] Irebesartan 150 mg q BID Ins plan only allows max dose of 1 QD or use higher strength Please prescribe changes or obtain PA call [phone]

## 2019-06-25 ENCOUNTER — Encounter: Payer: Self-pay | Admitting: Family Medicine

## 2019-06-25 MED ORDER — IRBESARTAN 300 MG PO TABS: 300.0000 mg | ORAL_TABLET | Freq: Every day | ORAL | 3 refills | Status: DC

## 2019-06-25 NOTE — Addendum Note (Signed)
Addended by: Caryl Pina on: 06/25/2019 08:06 AM   Modules accepted: Orders

## 2019-06-25 NOTE — Telephone Encounter (Signed)
Patient aware, script is ready. 

## 2019-06-25 NOTE — Telephone Encounter (Signed)
Please let patient know that due to insurance coverage we had to switch medicine from 150 mg twice a day to 300 mg once a day.  It should work the same but do into insurance coverage they would not cover the 2 pills a day but they would cover the 1 a day.

## 2019-07-24 DIAGNOSIS — D126 Benign neoplasm of colon, unspecified: Secondary | ICD-10-CM | POA: Diagnosis not present

## 2019-07-24 DIAGNOSIS — K633 Ulcer of intestine: Secondary | ICD-10-CM | POA: Diagnosis not present

## 2019-07-24 DIAGNOSIS — Z8371 Family history of colonic polyps: Secondary | ICD-10-CM | POA: Diagnosis not present

## 2019-08-28 ENCOUNTER — Other Ambulatory Visit: Payer: Self-pay | Admitting: Family Medicine

## 2019-08-28 DIAGNOSIS — G8929 Other chronic pain: Secondary | ICD-10-CM

## 2019-09-29 ENCOUNTER — Other Ambulatory Visit: Payer: Self-pay

## 2019-09-29 ENCOUNTER — Encounter: Payer: Self-pay | Admitting: Family Medicine

## 2019-09-29 ENCOUNTER — Ambulatory Visit (INDEPENDENT_AMBULATORY_CARE_PROVIDER_SITE_OTHER): Payer: Medicare Other | Admitting: Family Medicine

## 2019-09-29 VITALS — HR 75 | Temp 97.9°F | Ht 67.0 in | Wt 175.0 lb

## 2019-09-29 DIAGNOSIS — Z0289 Encounter for other administrative examinations: Secondary | ICD-10-CM | POA: Diagnosis not present

## 2019-09-29 DIAGNOSIS — M545 Low back pain: Secondary | ICD-10-CM

## 2019-09-29 DIAGNOSIS — E785 Hyperlipidemia, unspecified: Secondary | ICD-10-CM

## 2019-09-29 DIAGNOSIS — I10 Essential (primary) hypertension: Secondary | ICD-10-CM

## 2019-09-29 DIAGNOSIS — E1159 Type 2 diabetes mellitus with other circulatory complications: Secondary | ICD-10-CM

## 2019-09-29 DIAGNOSIS — E1169 Type 2 diabetes mellitus with other specified complication: Secondary | ICD-10-CM | POA: Diagnosis not present

## 2019-09-29 DIAGNOSIS — G8929 Other chronic pain: Secondary | ICD-10-CM

## 2019-09-29 DIAGNOSIS — Z79891 Long term (current) use of opiate analgesic: Secondary | ICD-10-CM

## 2019-09-29 LAB — BAYER DCA HB A1C WAIVED: HB A1C (BAYER DCA - WAIVED): 8.1 % — ABNORMAL HIGH (ref <7.0)

## 2019-09-29 MED ORDER — TRAMADOL HCL 50 MG PO TABS: 50.0000 mg | ORAL_TABLET | Freq: Two times a day (BID) | ORAL | 1 refills | Status: DC | PRN

## 2019-09-29 MED ORDER — OZEMPIC (0.25 OR 0.5 MG/DOSE) 2 MG/1.5ML ~~LOC~~ SOPN: 0.5000 mg | PEN_INJECTOR | SUBCUTANEOUS | 3 refills | Status: DC

## 2019-09-29 NOTE — Progress Notes (Signed)
Pulse 75    Temp 97.9 F (36.6 C)    Ht '5\' 7"'  (1.702 m)    Wt 175 lb (79.4 kg)    SpO2 98%    BMI 27.41 kg/m    Subjective:   Patient ID: Jeffrey Rubio, male    DOB: 10-Jun-1945, 74 y.o.   MRN: [id number]  HPI: Jeffrey Rubio is a 74 y.o. male presenting on 09/29/2019 for Medical Management of Chronic Issues and Diabetes   HPI Type 2 diabetes mellitus Patient comes in today for recheck of his diabetes. Patient has been currently taking Metformin, A1c 8.1. Patient is currently on an ACE inhibitor/ARB. Patient has not seen an ophthalmologist this year. Patient denies any issues with their feet. The symptom started onset as an adult hypertension hyperlipidemia ARE RELATED TO DM   Hypertension Patient is currently on irbesartan and amlodipine, and their blood pressure today is 130/71. Patient denies any lightheadedness or dizziness. Patient denies headaches, blurred vision, chest pains, shortness of breath, or weakness. Denies any side effects from medication and is content with current medication.   Hyperlipidemia Patient is coming in for recheck of his hyperlipidemia. The patient is currently taking pravastatin. They deny any issues with myalgias or history of liver damage from it. They deny any focal numbness or weakness or chest pain.   Pain assessment: Cause of pain-right hip pain, likely osteoarthritis, intermittent Pain location-right hip Pain on scale of 1-10-2 Frequency-occasionally, mostly manages with Tylenol What increases pain-occasionally weather or overworking What makes pain Better-most of the time Tylenol works but uses the occasional tramadol Effects on ADL -none Any change in general medical condition-none  Current opioids rx-tramadol 50 mg twice daily as needed # meds rx-30, has only gotten 1 prescription in the past year of 30 Effectiveness of current meds-works okay, Tylenol sometimes works better Adverse reactions form pain meds-none currently Morphine  equivalent-10  Pill count performed-No Last drug screen -09/18/2018 ( high risk q51m moderate risk q628mlow risk yearly ) Urine drug screen today- Yes [place]  If yes were their any concerning findings? -None    No flowsheet data found.   Pain contract signed on: today  Relevant past medical, surgical, family and social history reviewed and updated as indicated. Interim medical history since our last visit reviewed. Allergies and medications reviewed and updated.  Review of Systems  Constitutional: Negative for chills and fever.  Respiratory: Negative for shortness of breath and wheezing.   Cardiovascular: Negative for chest pain and leg swelling.  Musculoskeletal: Negative for back pain and gait problem.  Skin: Negative for rash.  Neurological: Negative for dizziness, weakness and light-headedness.  All other systems reviewed and are negative.   Per HPI unless specifically indicated above   Allergies as of 09/29/2019      Reactions   Vilazodone Hcl    Other reaction(s): Other      Medication List       Accurate as of September 29, 2019  9:39 AM. If you have any questions, ask your nurse or doctor.        Accu-Chek Aviva Plus test strip Generic drug: glucose blood Test BS daily Dx E11.69   Accu-Chek Aviva Plus w/Device Kit USE to check blood sugars daily   accu-chek soft touch lancets Check BS daily   acetaminophen 500 MG tablet Commonly known as: TYLENOL Take 500 mg by mouth every 6 (six) hours as needed for mild pain or moderate pain.   amLODipine  5 MG tablet Commonly known as: NORVASC Take 1 tablet (5 mg total) by mouth daily.   amLODipine 5 MG tablet Commonly known as: NORVASC TAKE 1 TABLET BY MOUTH EVERY DAY   aspirin 81 MG tablet Take 81 mg by mouth daily.   EQL Super Omega-3 Caps Take 4 capsules by mouth daily. 2 capsules in morning and 2 capsules in the evening   irbesartan 300 MG tablet Commonly known as: Avapro Take 1  tablet (300 mg total) by mouth daily.   magnesium gluconate 500 MG tablet Commonly known as: MAGONATE Take 500 mg by mouth every other day.   meclizine 25 MG tablet Commonly known as: ANTIVERT Take 1 tablet (25 mg total) by mouth 3 (three) times daily as needed for dizziness.   metFORMIN 500 MG tablet Commonly known as: GLUCOPHAGE Take 1 tablet (500 mg total) by mouth 2 (two) times daily with a meal.   pravastatin 20 MG tablet Commonly known as: PRAVACHOL Take 1 tablet (20 mg total) by mouth daily.   PROBIOTIC-10 PO Take by mouth daily.   traMADol 50 MG tablet Commonly known as: ULTRAM Take 1 tablet (50 mg total) by mouth every 12 (twelve) hours as needed.   vitamin B-12 1000 MCG tablet Commonly known as: CYANOCOBALAMIN Take 1,000 mcg by mouth every other day.   Vitamin D3 50 MCG (2000 UT) Tabs Take 4,000 Units by mouth every morning.   zinc gluconate 50 MG tablet Take 50 mg by mouth daily.        Objective:   Pulse 75    Temp 97.9 F (36.6 C)    Ht '5\' 7"'  (1.702 m)    Wt 175 lb (79.4 kg)    SpO2 98%    BMI 27.41 kg/m   Wt Readings from Last 3 Encounters:  09/29/19 175 lb (79.4 kg)  06/20/19 177 lb (80.3 kg)  03/20/19 179 lb 9.6 oz (81.5 kg)    Physical Exam Vitals and nursing note reviewed.  Constitutional:      General: He is not in acute distress.    Appearance: He is well-developed. He is not diaphoretic.  Eyes:     General: No scleral icterus.    Conjunctiva/sclera: Conjunctivae normal.  Neck:     Thyroid: No thyromegaly.  Cardiovascular:     Rate and Rhythm: Normal rate and regular rhythm.     Heart sounds: Normal heart sounds. No murmur heard.   Pulmonary:     Effort: Pulmonary effort is normal. No respiratory distress.     Breath sounds: Normal breath sounds. No wheezing.  Musculoskeletal:        General: No tenderness (No pain or issue on the hip today). Normal range of motion.     Cervical back: Neck supple.  Lymphadenopathy:      Cervical: No cervical adenopathy.  Skin:    General: Skin is warm and dry.     Findings: No rash.  Neurological:     Mental Status: He is alert and oriented to person, place, and time.     Coordination: Coordination normal.  Psychiatric:        Behavior: Behavior normal.       Assessment & Plan:   Problem List Items Addressed This Visit      Cardiovascular and Mediastinum   Hypertension associated with diabetes ([place])   Relevant Medications   Semaglutide,0.25 or 0.5MG/DOS, (OZEMPIC, 0.25 OR 0.5 MG/DOSE,) 2 MG/1.5ML SOPN     Endocrine   Hyperlipidemia associated with  type 2 diabetes mellitus ([facility])   Relevant Medications   Semaglutide,0.25 or 0.5MG/DOS, (OZEMPIC, 0.25 OR 0.5 MG/DOSE,) 2 MG/1.5ML SOPN   DM (diabetes mellitus) ([place]) - Primary   Relevant Medications   Semaglutide,0.25 or 0.5MG/DOS, (OZEMPIC, 0.25 OR 0.5 MG/DOSE,) 2 MG/1.5ML SOPN   Other Relevant Orders   Bayer DCA Hb A1c Waived     Other   Pain management contract signed   Relevant Medications   traMADol (ULTRAM) 50 MG tablet   Other Relevant Orders   ToxASSURE Select 13 (MW), Urine    Other Visit Diagnoses    Chronic bilateral low back pain without sciatica       Relevant Medications   traMADol (ULTRAM) 50 MG tablet   Other Relevant Orders   ToxASSURE Select 13 (MW), Urine   Long term prescription opiate use       Relevant Medications   traMADol (ULTRAM) 50 MG tablet   Other Relevant Orders   ToxASSURE Select 13 (MW), Urine      Gave samples for Ozempic, continue tramadol, usually only gets once a year.  A1c is 8.1 so we will try to see if the Ozempic will improve.  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 Follow up plan: Return in about 3 months (around 12/30/2019), or if symptoms worsen or fail to improve.  Counseling provided for all of the vaccine components Orders Placed This Encounter  Procedures   Bayer Simpson Hb A1c [place] Kiesha Ensey, MD [facility] 09/29/2019, 9:39 AM

## 2019-10-04 LAB — TOXASSURE SELECT 13 (MW), URINE

## 2019-10-07 ENCOUNTER — Other Ambulatory Visit: Payer: Self-pay

## 2019-10-07 ENCOUNTER — Encounter: Payer: Self-pay | Admitting: Pharmacist

## 2019-10-07 ENCOUNTER — Ambulatory Visit (INDEPENDENT_AMBULATORY_CARE_PROVIDER_SITE_OTHER): Payer: Medicare Other | Admitting: Pharmacist

## 2019-10-07 VITALS — BP 114/77 | HR 75

## 2019-10-07 DIAGNOSIS — E1169 Type 2 diabetes mellitus with other specified complication: Secondary | ICD-10-CM | POA: Diagnosis not present

## 2019-10-07 NOTE — Progress Notes (Signed)
    10/07/2019 Name: Jeffrey Rubio MRN: [id number] DOB: 19-Aug-1945   S:  35 yoM presents for diabetes evaluation, education, and management.  His wife is present for visit and very involved in patient's care. Patient was referred and last seen by Primary Care Provider on 09/29/19. Patient reports Diabetes was diagnosed 30- years ago.  Insurance coverage/medication affordability: medicare [facility]  Patient reports adherence with medications. . Current diabetes medications include: metformin, newly started ozempic 0.'25mg'$  sq weekly . Current hypertension medications include: amlodipine, irbesartan Goal 130/80 . Current hyperlipidemia medications include: pravastatin   Patient denies hypoglycemic events.   Patient reported dietary habits: Eats 3 meals/day -he is mindful of carbs per meal 45-60g per meal -follows plate method -has met with dietician last fall -has cut out all snacking  Patient-reported exercise habits: n/a  Patient denies nocturia (nighttime urination).  Patient denies neuropathy (nerve pain).  Patient denies visual changes.  Patient reports self foot exams.    O:  Lab Results  Component Value Date   HGBA1C 8.1 (H) 09/29/2019    Vitals:   10/07/19 0924  BP: 114/77  Pulse: 75     Lipid Panel     Component Value Date/Time   CHOL 158 06/20/2019 1040   CHOL 161 10/22/2012 1032   TRIG 251 (H) 06/20/2019 1040   TRIG 211 (H) 06/03/2013 0925   TRIG 175 (H) 10/22/2012 1032   HDL 46 06/20/2019 1040   HDL 45 06/03/2013 0925   HDL 46 10/22/2012 1032   CHOLHDL 3.4 06/20/2019 1040   LDLCALC 71 06/20/2019 1040   LDLCALC 63 06/03/2013 0925   LDLCALC 80 10/22/2012 1032    Home fasting blood sugars: 158, 175, 128 after   2 hour post-meal/random blood sugars: n/a.   A/P:  Diabetes T2DM currently uncontrolled. Patient is adherent with medication. Control is suboptimal due to diet/lifestyle--patient actively making changes.  -Continue Ozempic  0.'25mg'$  for 2 more weeks.  Instructed patient to increase to 0.'5mg'$  sq weekly thereafter.    Application for [employer] patient assistance filled out with patient, awaiting PCP sign & patient financials  -Continue metformin for now  Consider d/c metformin once on Ozempic   -Extensively discussed pathophysiology of diabetes, recommended lifestyle interventions, dietary effects on blood sugar control  -Counseled on s/sx of and management of hypoglycemia  -Next A1C anticipated 3-6 months.     Written patient instructions provided.  Total time in face to face counseling 30 minutes.   Follow up PCP Clinic Visit in September.   Regina Eck, PharmD, BCPS Clinical Pharmacist, [place]  [facility] Phone [phone]

## 2019-10-15 ENCOUNTER — Other Ambulatory Visit: Payer: Self-pay | Admitting: Family Medicine

## 2019-10-16 ENCOUNTER — Other Ambulatory Visit: Payer: Self-pay | Admitting: *Deleted

## 2019-10-16 MED ORDER — FAMOTIDINE 20 MG PO TABS: 20.0000 mg | ORAL_TABLET | Freq: Two times a day (BID) | ORAL | 2 refills | Status: DC

## 2019-10-16 NOTE — Telephone Encounter (Signed)
I sent famotidine for the patient to his local pharmacy

## 2019-10-16 NOTE — Telephone Encounter (Signed)
pts wife calling requesting refill on Famotidine 20mg . Pt has been off of it for a year and was doing well but is now having some indigestion especially at night. It isn't on current med list. Is it ok to send in? Pt just saw you 10/07/19.

## 2019-10-16 NOTE — Telephone Encounter (Signed)
Pt aware.

## 2019-10-20 ENCOUNTER — Ambulatory Visit (INDEPENDENT_AMBULATORY_CARE_PROVIDER_SITE_OTHER): Payer: Medicare Other | Admitting: *Deleted

## 2019-10-20 DIAGNOSIS — E1159 Type 2 diabetes mellitus with other circulatory complications: Secondary | ICD-10-CM | POA: Diagnosis not present

## 2019-10-20 DIAGNOSIS — I10 Essential (primary) hypertension: Secondary | ICD-10-CM | POA: Diagnosis not present

## 2019-10-20 DIAGNOSIS — E1169 Type 2 diabetes mellitus with other specified complication: Secondary | ICD-10-CM

## 2019-10-20 DIAGNOSIS — E785 Hyperlipidemia, unspecified: Secondary | ICD-10-CM

## 2019-10-20 NOTE — Patient Instructions (Signed)
Visit Information  Goals Addressed            This Visit's Progress   . Chronic Disease Management Needs       CARE PLAN ENTRY (see longtitudinal plan of care for additional care plan information)  Current Barriers:  . Chronic Disease Management support, education, and care coordination needs related to HTN, DM, HLD, fibromyalgia  Clinical Goal(s) related to HTN, DM, HLD, fibromyalgia:  Over the next 90 days, patient will:  . Work with the care management team to address educational, disease management, and care coordination needs  . Begin or continue self health monitoring activities as directed today Measure and record cbg (blood glucose) 1 times daily and Measure and record blood pressure 3-4 times per week . Call provider office for new or worsened signs and symptoms Blood glucose findings outside established parameters and Blood pressure findings outside established parameters . Call care management team with questions or concerns . Verbalize basic understanding of patient centered plan of care established today  Interventions related to HTN, DM, HLD, fibromyalgia:  . Evaluation of current treatment plans and patient's adherence to plan as established by provider . Assessed patient understanding of disease states . Assessed patient's education and care coordination needs . Provided disease specific education to patient  . Collaborated with appropriate clinical care team members regarding patient needs  Patient Self Care Activities related to HTN, DM, HLD, fibromyalgia:  . Patient is unable to independently self-manage chronic health conditions  Initial goal documentation        Jeffrey Rubio was given information about Chronic Care Management services today including:  1. CCM service includes personalized support from designated clinical staff supervised by his physician, including individualized plan of care and coordination with other care providers 2. 24/7 contact phone  numbers for assistance for urgent and routine care needs. 3. Service will only be billed when office clinical staff spend 20 minutes or more in a month to coordinate care. 4. Only one practitioner may furnish and bill the service in a calenda2r month. 5. The patient may stop CCM services at any time (effective at the end of the month) by phone call to the office staff. 6. The patient will be responsible for cost sharing (co-pay) of up to 20% of the service fee (after annual deductible is met).  Patient agreed to services and verbal consent obtained.   Patient verbalizes understanding of instructions provided today.   The care management team will reach out to the patient again over the next 90 days.   Follow-up with PCP on 10/30/19  Chong Sicilian, BSN, RN-BC [facility] Direct Dial: [phone]

## 2019-10-20 NOTE — Chronic Care Management (AMB) (Signed)
Chronic Care Management   Initial Visit Note  10/20/2019 Name: Jeffrey Rubio MRN: [id number] DOB: 02/24/1946  Referred by: Dettinger, Fransisca Kaufmann, MD Reason for referral : Chronic Care Management (Initial Visit)   Jeffrey Rubio is a 74 y.o. year old male who is a primary care patient of Dettinger, Fransisca Kaufmann, MD. The CCM team was consulted for assistance with chronic disease management and care coordination needs related to HTN, DM, HLD, fibromyalgia.  Review of patient status, including review of consultants reports, relevant laboratory and other test results, and collaboration with appropriate care team members and the patient's provider was performed as part of comprehensive patient evaluation and provision of chronic care management services.    Subjective: I spoke with Mr Goodley by telephone today regarding management of his chronic medical conditions. He is working with his PCP and the clinical pharmacist to manage diabetes but he would like additional assistance from the CCM team. He has talked with the clinical pharmacist about Ozempic Rx assistance and does not have any other resource needs at this time.   SDOH (Social Determinants of Health) assessments performed: Yes See Care Plan activities for detailed interventions related to SDOH     Objective: Outpatient Encounter Medications as of 10/20/2019  Medication Sig  . acetaminophen (TYLENOL) 500 MG tablet Take 500 mg by mouth every 6 (six) hours as needed for mild pain or moderate pain.  Marland Kitchen amLODipine (NORVASC) 5 MG tablet Take 1 tablet (5 mg total) by mouth daily.  Marland Kitchen aspirin 81 MG tablet Take 81 mg by mouth daily.  . Blood Glucose Monitoring Suppl (ACCU-CHEK AVIVA PLUS) w/Device KIT USE to check blood sugars daily  . Cholecalciferol (VITAMIN D3) 2000 units TABS Take 4,000 Units by mouth every morning.  . famotidine (PEPCID) 20 MG tablet Take 1 tablet (20 mg total) by mouth 2 (two) times daily.  Marland Kitchen glucose blood (ACCU-CHEK  AVIVA PLUS) test strip Test BS daily Dx E11.69  . irbesartan (AVAPRO) 300 MG tablet Take 1 tablet (300 mg total) by mouth daily.  . Lancets (ACCU-CHEK SOFT TOUCH) lancets Check BS daily  . magnesium gluconate (MAGONATE) 500 MG tablet Take 500 mg by mouth every other day.   . meclizine (ANTIVERT) 25 MG tablet Take 1 tablet (25 mg total) by mouth 3 (three) times daily as needed for dizziness.  . metFORMIN (GLUCOPHAGE) 500 MG tablet Take 1 tablet (500 mg total) by mouth 2 (two) times daily with a meal.  . Misc Natural Products (EQL SUPER OMEGA-3) CAPS Take 4 capsules by mouth daily. 2 capsules in morning and 2 capsules in the evening  . pravastatin (PRAVACHOL) 20 MG tablet Take 1 tablet (20 mg total) by mouth daily.  . Probiotic Product (PROBIOTIC-10 PO) Take by mouth daily.  . Semaglutide,0.25 or 0.5MG/DOS, (OZEMPIC, 0.25 OR 0.5 MG/DOSE,) 2 MG/1.5ML SOPN Inject 0.375 mLs (0.5 mg total) into the skin once a week.  . traMADol (ULTRAM) 50 MG tablet Take 1 tablet (50 mg total) by mouth every 12 (twelve) hours as needed.  . vitamin B-12 (CYANOCOBALAMIN) 1000 MCG tablet Take 1,000 mcg by mouth every other day.  . zinc gluconate 50 MG tablet Take 50 mg by mouth daily.     No facility-administered encounter medications on file as of 10/20/2019.    Lab Results  Component Value Date   HGBA1C 8.1 (H) 09/29/2019   HGBA1C 7.7 (H) 06/20/2019   HGBA1C 8.1 (H) 03/20/2019   Lab Results  Component Value Date  MICROALBUR neg 07/24/2014   LDLCALC 71 06/20/2019   CREATININE 1.18 06/20/2019   Lab Results  Component Value Date   CHOL 158 06/20/2019   HDL 46 06/20/2019   LDLCALC 71 06/20/2019   TRIG 251 (H) 06/20/2019   CHOLHDL 3.4 06/20/2019   BP Readings from Last 3 Encounters:  10/07/19 114/77  06/20/19 133/77  03/20/19 (!) 147/79    RN Goals            This Visit's Progress   . Chronic Disease Management Needs       CARE PLAN ENTRY (see longtitudinal plan of care for additional care plan  information)  Current Barriers:  . Chronic Disease Management support, education, and care coordination needs related to HTN, DM, HLD, fibromyalgia  Clinical Goal(s) related to HTN, DM, HLD, fibromyalgia:  Over the next 90 days, patient will:  . Work with the care management team to address educational, disease management, and care coordination needs  . Begin or continue self health monitoring activities as directed today Measure and record cbg (blood glucose) 1 times daily and Measure and record blood pressure 3-4 times per week . Call provider office for new or worsened signs and symptoms Blood glucose findings outside established parameters and Blood pressure findings outside established parameters . Call care management team with questions or concerns . Verbalize basic understanding of patient centered plan of care established today  Interventions related to HTN, DM, HLD, fibromyalgia:  . Evaluation of current treatment plans and patient's adherence to plan as established by provider . Assessed patient understanding of disease states . Assessed patient's education and care coordination needs . Provided disease specific education to patient  . Collaborated with appropriate clinical care team members regarding patient needs . Chart reviewed, including recent office notes and lab results . Reviewed and discussed medications . Discussed Ozempic Rx assistance . Discussed diet and exercise . Discussed home blood sugar readings o Measuring each morning o Numbers are improving since starting Ozempic . Discussed home blood pressure readings o Measures 3 times a week o Readings are normal . Provided with RNCM contact number and encouraged to reach out as needed  Patient Self Care Activities related to HTN, DM, HLD, fibromyalgia:  . Patient is unable to independently self-manage chronic health conditions  Initial goal documentation          Plan:   The care management team will  reach out to the patient again over the next 90 days.   PCP visit 12/31/19  Chong Sicilian, BSN, RN-BC Embedded [place] [facility] [place] [facility] Direct Dial: [phone]

## 2019-10-21 ENCOUNTER — Telehealth: Payer: Self-pay | Admitting: Pharmacist

## 2019-10-21 NOTE — Telephone Encounter (Signed)
Patient approved for Novo Nordisk patient assistance (Ozempic) Letter received on 10/14/19 Ozempic shipment to arrive at PCP office in 10-14 business days 

## 2019-11-05 ENCOUNTER — Telehealth: Payer: Self-pay | Admitting: Pharmacist

## 2019-11-05 NOTE — Telephone Encounter (Signed)
ozempic shipment patient assistance supply arrived Left message on home phone Placed in refrigerator with patient name

## 2019-12-31 ENCOUNTER — Encounter: Payer: Self-pay | Admitting: Family Medicine

## 2019-12-31 ENCOUNTER — Other Ambulatory Visit: Payer: Self-pay

## 2019-12-31 ENCOUNTER — Ambulatory Visit (INDEPENDENT_AMBULATORY_CARE_PROVIDER_SITE_OTHER): Payer: Medicare Other | Admitting: Family Medicine

## 2019-12-31 VITALS — BP 131/82 | HR 62 | Temp 98.0°F | Ht 67.0 in | Wt 172.0 lb

## 2019-12-31 DIAGNOSIS — E1169 Type 2 diabetes mellitus with other specified complication: Secondary | ICD-10-CM

## 2019-12-31 DIAGNOSIS — E785 Hyperlipidemia, unspecified: Secondary | ICD-10-CM | POA: Diagnosis not present

## 2019-12-31 DIAGNOSIS — Z23 Encounter for immunization: Secondary | ICD-10-CM

## 2019-12-31 DIAGNOSIS — E1159 Type 2 diabetes mellitus with other circulatory complications: Secondary | ICD-10-CM

## 2019-12-31 DIAGNOSIS — G8929 Other chronic pain: Secondary | ICD-10-CM

## 2019-12-31 DIAGNOSIS — Z79891 Long term (current) use of opiate analgesic: Secondary | ICD-10-CM

## 2019-12-31 DIAGNOSIS — M545 Low back pain: Secondary | ICD-10-CM

## 2019-12-31 DIAGNOSIS — I10 Essential (primary) hypertension: Secondary | ICD-10-CM

## 2019-12-31 DIAGNOSIS — Z0289 Encounter for other administrative examinations: Secondary | ICD-10-CM

## 2019-12-31 LAB — BAYER DCA HB A1C WAIVED: HB A1C (BAYER DCA - WAIVED): 6.9 % (ref <7.0)

## 2019-12-31 NOTE — Progress Notes (Signed)
BP 131/82   Pulse 62   Temp 98 F (36.7 C)   Ht '5\' 7"'  (1.702 m)   Wt 172 lb (78 kg)   SpO2 97%   BMI 26.94 kg/m    Subjective:   Patient ID: Jeffrey Rubio, male    DOB: 13-Oct-1945, 74 y.o.   MRN: [id number]  HPI: Jeffrey Rubio is a 74 y.o. male presenting on 12/31/2019 for Medical Management of Chronic Issues and Diabetes   HPI Hypertension Patient is currently on amlodipine and irbesartan, and their blood pressure today is 131/82. Patient denies any lightheadedness or dizziness. Patient denies headaches, blurred vision, chest pains, shortness of breath, or weakness. Denies any side effects from medication and is content with current medication.   Type 2 diabetes mellitus Patient comes in today for recheck of his diabetes. Patient has been currently taking Metformin and Ozempic, A1c 6.9 which is much improved today. Patient is currently on an ACE inhibitor/ARB. Patient has not seen an ophthalmologist this year. Patient denies any issues with their feet. The symptom started onset as an adult hypertension and hyperlipidemia ARE RELATED TO DM   Hyperlipidemia Patient is coming in for recheck of his hyperlipidemia. The patient is currently taking pravastatin. They deny any issues with myalgias or history of liver damage from it. They deny any focal numbness or weakness or chest pain.  His A1c last time was in the eights  Relevant past medical, surgical, family and social history reviewed and updated as indicated. Interim medical history since our last visit reviewed. Allergies and medications reviewed and updated.  Review of Systems  Constitutional: Negative for chills and fever.  Eyes: Negative for visual disturbance.  Respiratory: Negative for shortness of breath and wheezing.   Cardiovascular: Negative for chest pain and leg swelling.  Gastrointestinal: Positive for diarrhea.  Musculoskeletal: Negative for back pain and gait problem.  Skin: Negative for rash.   Neurological: Negative for dizziness, weakness and light-headedness.  All other systems reviewed and are negative.   Per HPI unless specifically indicated above   Allergies as of 12/31/2019      Reactions   Vilazodone Hcl    Other reaction(s): Other      Medication List       Accurate as of December 31, 2019 11:03 AM. If you have any questions, ask your nurse or doctor.        Accu-Chek Aviva Plus test strip Generic drug: glucose blood Test BS daily Dx E11.69   Accu-Chek Aviva Plus w/Device Kit USE to check blood sugars daily   accu-chek soft touch lancets Check BS daily   acetaminophen 500 MG tablet Commonly known as: TYLENOL Take 500 mg by mouth every 6 (six) hours as needed for mild pain or moderate pain.   amLODipine 5 MG tablet Commonly known as: NORVASC Take 1 tablet (5 mg total) by mouth daily.   aspirin 81 MG tablet Take 81 mg by mouth daily.   EQL Super Omega-3 Caps Take 4 capsules by mouth daily. 2 capsules in morning and 2 capsules in the evening   famotidine 20 MG tablet Commonly known as: PEPCID Take 1 tablet (20 mg total) by mouth 2 (two) times daily.   irbesartan 300 MG tablet Commonly known as: Avapro Take 1 tablet (300 mg total) by mouth daily.   magnesium gluconate 500 MG tablet Commonly known as: MAGONATE Take 500 mg by mouth every other day.   meclizine 25 MG tablet Commonly known as: ANTIVERT Take  1 tablet (25 mg total) by mouth 3 (three) times daily as needed for dizziness.   metFORMIN 500 MG tablet Commonly known as: GLUCOPHAGE Take 1 tablet (500 mg total) by mouth 2 (two) times daily with a meal.   Ozempic (0.25 or 0.5 MG/DOSE) 2 MG/1.5ML Sopn Generic drug: Semaglutide(0.25 or 0.5MG/DOS) Inject 0.375 mLs (0.5 mg total) into the skin once a week.   pravastatin 20 MG tablet Commonly known as: PRAVACHOL Take 1 tablet (20 mg total) by mouth daily.   PROBIOTIC-10 PO Take by mouth daily.   traMADol 50 MG tablet Commonly  known as: ULTRAM Take 1 tablet (50 mg total) by mouth every 12 (twelve) hours as needed.   vitamin B-12 1000 MCG tablet Commonly known as: CYANOCOBALAMIN Take 1,000 mcg by mouth every other day.   Vitamin D3 50 MCG (2000 UT) Tabs Take 4,000 Units by mouth every morning.   zinc gluconate 50 MG tablet Take 50 mg by mouth daily.        Objective:   BP 131/82   Pulse 62   Temp 98 F (36.7 C)   Ht '5\' 7"'  (1.702 m)   Wt 172 lb (78 kg)   SpO2 97%   BMI 26.94 kg/m   Wt Readings from Last 3 Encounters:  12/31/19 172 lb (78 kg)  09/29/19 175 lb (79.4 kg)  06/20/19 177 lb (80.3 kg)    Physical Exam Vitals and nursing note reviewed.  Constitutional:      General: He is not in acute distress.    Appearance: He is well-developed. He is not diaphoretic.  Eyes:     General: No scleral icterus.    Conjunctiva/sclera: Conjunctivae normal.  Neck:     Thyroid: No thyromegaly.  Cardiovascular:     Rate and Rhythm: Normal rate and regular rhythm.     Heart sounds: Normal heart sounds. No murmur heard.   Pulmonary:     Effort: Pulmonary effort is normal. No respiratory distress.     Breath sounds: Normal breath sounds. No wheezing.  Musculoskeletal:        General: Normal range of motion.     Cervical back: Neck supple.  Lymphadenopathy:     Cervical: No cervical adenopathy.  Skin:    General: Skin is warm and dry.     Findings: No rash.  Neurological:     Mental Status: He is alert and oriented to person, place, and time.     Coordination: Coordination normal.  Psychiatric:        Behavior: Behavior normal.       Assessment & Plan:   Problem List Items Addressed This Visit      Cardiovascular and Mediastinum   Hypertension associated with diabetes ([place])     Endocrine   Hyperlipidemia associated with type 2 diabetes mellitus ([place])   DM (diabetes mellitus) ([place]) - Primary   Relevant Orders   Bayer DCA Hb A1c Waived     Other   Pain management contract signed     Other Visit Diagnoses    Flu vaccine need       Relevant Orders   Flu Vaccine QUAD High Dose(Fluad) (Completed)   Chronic bilateral low back pain without sciatica       Long term prescription opiate use          A1c much improved at 6.9.,  Continue current medication, and having some side effects from the medicine but seems to be improving on the side effects and  will continue with it. Follow up plan: Return in about 3 months (around 03/31/2020), or if symptoms worsen or fail to improve, for Diabetes hypertension hyperlipidemia.  Counseling provided for all of the vaccine components Orders Placed This Encounter  Procedures  . Flu Vaccine QUAD High Dose(Fluad)  . Bayer [facility] Hb A1c [place], MD [facility] 12/31/2019, 11:03 AM

## 2019-12-31 NOTE — Addendum Note (Signed)
Addended by: Alphonzo Dublin on: 12/31/2019 11:25 AM   Modules accepted: Orders

## 2020-01-07 ENCOUNTER — Ambulatory Visit: Payer: Medicare Other | Admitting: *Deleted

## 2020-01-07 DIAGNOSIS — I152 Hypertension secondary to endocrine disorders: Secondary | ICD-10-CM

## 2020-01-07 DIAGNOSIS — E1169 Type 2 diabetes mellitus with other specified complication: Secondary | ICD-10-CM

## 2020-01-07 NOTE — Chronic Care Management (AMB) (Signed)
Chronic Care Management   Follow Up Note   01/07/2020 Name: Jeffrey Rubio MRN: [id number] DOB: 18-Dec-1945  Referred by: Dettinger, Fransisca Kaufmann, MD Reason for referral : Chronic Care Management (RN follow up)   Jeffrey Rubio is a 74 y.o. year old male who is a primary care patient of Dettinger, Fransisca Kaufmann, MD. The CCM team was consulted for assistance with chronic disease management and care coordination needs.    Review of patient status, including review of consultants reports, relevant laboratory and other test results, and collaboration with appropriate care team members and the patient's provider was performed as part of comprehensive patient evaluation and provision of chronic care management services.    SDOH (Social Determinants of Health) assessments performed: No See Care Plan activities for detailed interventions related to [facility])     Outpatient Encounter Medications as of 01/07/2020  Medication Sig  . acetaminophen (TYLENOL) 500 MG tablet Take 500 mg by mouth every 6 (six) hours as needed for mild pain or moderate pain.  Marland Kitchen amLODipine (NORVASC) 5 MG tablet Take 1 tablet (5 mg total) by mouth daily.  Marland Kitchen aspirin 81 MG tablet Take 81 mg by mouth daily.  . Blood Glucose Monitoring Suppl (ACCU-CHEK AVIVA PLUS) w/Device KIT USE to check blood sugars daily  . Cholecalciferol (VITAMIN D3) 2000 units TABS Take 4,000 Units by mouth every morning.  . famotidine (PEPCID) 20 MG tablet Take 1 tablet (20 mg total) by mouth 2 (two) times daily.  Marland Kitchen glucose blood (ACCU-CHEK AVIVA PLUS) test strip Test BS daily Dx E11.69  . irbesartan (AVAPRO) 300 MG tablet Take 1 tablet (300 mg total) by mouth daily.  . Lancets (ACCU-CHEK SOFT TOUCH) lancets Check BS daily  . magnesium gluconate (MAGONATE) 500 MG tablet Take 500 mg by mouth every other day.   . meclizine (ANTIVERT) 25 MG tablet Take 1 tablet (25 mg total) by mouth 3 (three) times daily as needed for dizziness.  . metFORMIN (GLUCOPHAGE) 500  MG tablet Take 1 tablet (500 mg total) by mouth 2 (two) times daily with a meal.  . Misc Natural Products (EQL SUPER OMEGA-3) CAPS Take 4 capsules by mouth daily. 2 capsules in morning and 2 capsules in the evening  . pravastatin (PRAVACHOL) 20 MG tablet Take 1 tablet (20 mg total) by mouth daily.  . Probiotic Product (PROBIOTIC-10 PO) Take by mouth daily.  . Semaglutide,0.25 or 0.5MG/DOS, (OZEMPIC, 0.25 OR 0.5 MG/DOSE,) 2 MG/1.5ML SOPN Inject 0.375 mLs (0.5 mg total) into the skin once a week.  . traMADol (ULTRAM) 50 MG tablet Take 1 tablet (50 mg total) by mouth every 12 (twelve) hours as needed.  . vitamin B-12 (CYANOCOBALAMIN) 1000 MCG tablet Take 1,000 mcg by mouth every other day.  . zinc gluconate 50 MG tablet Take 50 mg by mouth daily.     No facility-administered encounter medications on file as of 01/07/2020.    Lab Results  Component Value Date   HGBA1C 6.9 12/31/2019   HGBA1C 8.1 (H) 09/29/2019   HGBA1C 7.7 (H) 06/20/2019   Lab Results  Component Value Date   MICROALBUR neg 07/24/2014   LDLCALC 71 06/20/2019   CREATININE 1.18 06/20/2019   BP Readings from Last 3 Encounters:  12/31/19 131/82  10/07/19 114/77  06/20/19 133/77     Goals Addressed            This Visit's Progress   . Chronic Disease Management Needs   On track    CARE PLAN ENTRY (  see longtitudinal plan of care for additional care plan information)  Current Barriers:  . Chronic Disease Management support, education, and care coordination needs related to HTN, DM, HLD, fibromyalgia  Clinical Goal(s) related to HTN, DM, HLD, fibromyalgia:  Over the next 90 days, patient will:  . Continue to work with PCP and [facility] PharmD to manage HTN, DM, HLD, and fibromyalgia . Continue to check blood sugar and blood pressure as instructed  Interventions related to HTN, DM, HLD, fibromyalgia:  . Chart reviewed including recent office and telephone notes as well as lab results . Discussed improved  A1C . Medications reviewed . Discussed approval for Ozempic prescription assistance . Confirmed that patient is using Ozempic . Encouraged patient to continue following up with PharmD and PCP  . Encouraged patient to continue current medications . Encouraged patient to continue monitoring and reporting blood sugar and blood pressure readings to PCP . Encouraged patient to reach out to CCM team if care management or care coordination needs arise  Patient Self Care Activities related to HTN, DM, HLD, fibromyalgia:  . Patient is able to perform ADLs and IADLs independently . Patient is able to manage chronic medical conditions well at this time  Please see past updates related to this goal by clicking on the "Past Updates" button in the selected goal          Plan:  Patient is on track with self-management of his chronic medical conditions. Case closed to case management services in primary care home. CCM enrollment status changed to "previously enrolled" on 01/07/2020 to discontinue enrollment. Patient aware that he can re-enroll at any time services are needed in the future.   Chong Sicilian, BSN, RN-BC Embedded Chronic Care Manager [facility] [place] [facility] Direct Dial: [phone]

## 2020-01-07 NOTE — Patient Instructions (Signed)
Visit Information  Goals Addressed            This Visit's Progress   . Chronic Disease Management Needs   On track    CARE PLAN ENTRY (see longtitudinal plan of care for additional care plan information)  Current Barriers:  . Chronic Disease Management support, education, and care coordination needs related to HTN, DM, HLD, fibromyalgia  Clinical Goal(s) related to HTN, DM, HLD, fibromyalgia:  Over the next 90 days, patient will:  . Continue to work with PCP and [facility] PharmD to manage HTN, DM, HLD, and fibromyalgia . Continue to check blood sugar and blood pressure as instructed  Interventions related to HTN, DM, HLD, fibromyalgia:  . Chart reviewed including recent office and telephone notes as well as lab results . Discussed improved A1C . Medications reviewed . Discussed approval for Ozempic prescription assistance . Confirmed that patient is using Ozempic . Encouraged patient to continue following up with PharmD and PCP  . Encouraged patient to continue current medications . Encouraged patient to continue monitoring and reporting blood sugar and blood pressure readings to PCP . Encouraged patient to reach out to CCM team if care management or care coordination needs arise  Patient Self Care Activities related to HTN, DM, HLD, fibromyalgia:  . Patient is able to perform ADLs and IADLs independently . Patient is able to manage chronic medical conditions well at this time  Please see past updates related to this goal by clicking on the "Past Updates" button in the selected goal         Patient verbalizes understanding of instructions provided today.   Follow-up PlanPlan:  Patient is on track with self-management of his chronic medical conditions. Case closed to case management services in primary care home. CCM enrollment status changed to "previously enrolled" on 01/07/2020 to discontinue enrollment. Patient aware that he can re-enroll at any time services are needed in  the future.   Chong Sicilian, BSN, RN-BC Embedded Chronic Care Manager [facility] / [place] [facility] Direct Dial: [phone]

## 2020-01-14 ENCOUNTER — Other Ambulatory Visit: Payer: Self-pay | Admitting: Family Medicine

## 2020-01-14 DIAGNOSIS — E119 Type 2 diabetes mellitus without complications: Secondary | ICD-10-CM

## 2020-02-03 ENCOUNTER — Other Ambulatory Visit: Payer: Self-pay | Admitting: Family Medicine

## 2020-03-05 ENCOUNTER — Other Ambulatory Visit: Payer: Self-pay | Admitting: Family Medicine

## 2020-03-05 DIAGNOSIS — E785 Hyperlipidemia, unspecified: Secondary | ICD-10-CM

## 2020-03-18 ENCOUNTER — Encounter: Payer: Self-pay | Admitting: Family Medicine

## 2020-03-18 ENCOUNTER — Ambulatory Visit (INDEPENDENT_AMBULATORY_CARE_PROVIDER_SITE_OTHER): Payer: Medicare Other | Admitting: Family Medicine

## 2020-03-18 ENCOUNTER — Other Ambulatory Visit: Payer: Self-pay

## 2020-03-18 VITALS — BP 130/85 | HR 68 | Temp 98.0°F | Ht 67.0 in | Wt 176.0 lb

## 2020-03-18 DIAGNOSIS — E1169 Type 2 diabetes mellitus with other specified complication: Secondary | ICD-10-CM

## 2020-03-18 DIAGNOSIS — E559 Vitamin D deficiency, unspecified: Secondary | ICD-10-CM | POA: Diagnosis not present

## 2020-03-18 DIAGNOSIS — E1159 Type 2 diabetes mellitus with other circulatory complications: Secondary | ICD-10-CM | POA: Diagnosis not present

## 2020-03-18 DIAGNOSIS — I152 Hypertension secondary to endocrine disorders: Secondary | ICD-10-CM

## 2020-03-18 DIAGNOSIS — E785 Hyperlipidemia, unspecified: Secondary | ICD-10-CM

## 2020-03-18 DIAGNOSIS — E119 Type 2 diabetes mellitus without complications: Secondary | ICD-10-CM | POA: Diagnosis not present

## 2020-03-18 LAB — BAYER DCA HB A1C WAIVED: HB A1C (BAYER DCA - WAIVED): 7 % — ABNORMAL HIGH (ref <7.0)

## 2020-03-18 MED ORDER — ACCU-CHEK SOFT TOUCH LANCETS MISC: 11 refills | Status: DC

## 2020-03-18 MED ORDER — METFORMIN HCL 500 MG PO TABS: 500.0000 mg | ORAL_TABLET | Freq: Two times a day (BID) | ORAL | 3 refills | Status: DC

## 2020-03-18 MED ORDER — PRAVASTATIN SODIUM 20 MG PO TABS: 20.0000 mg | ORAL_TABLET | Freq: Every day | ORAL | 3 refills | Status: DC

## 2020-03-18 MED ORDER — IRBESARTAN 300 MG PO TABS: 300.0000 mg | ORAL_TABLET | Freq: Every day | ORAL | 3 refills | Status: DC

## 2020-03-18 MED ORDER — AMLODIPINE BESYLATE 5 MG PO TABS: 5.0000 mg | ORAL_TABLET | Freq: Every day | ORAL | 3 refills | Status: DC

## 2020-03-18 MED ORDER — OZEMPIC (0.25 OR 0.5 MG/DOSE) 2 MG/1.5ML ~~LOC~~ SOPN: 0.5000 mg | PEN_INJECTOR | SUBCUTANEOUS | 3 refills | Status: DC

## 2020-03-18 MED ORDER — FAMOTIDINE 20 MG PO TABS
20.0000 mg | ORAL_TABLET | Freq: Two times a day (BID) | ORAL | 3 refills | Status: DC
Start: 2020-03-18 — End: 2021-04-07

## 2020-03-18 NOTE — Progress Notes (Signed)
BP 130/85   Pulse 68   Temp 98 F (36.7 C)   Ht '5\' 7"'  (1.702 m)   Wt 176 lb (79.8 kg)   SpO2 98%   BMI 27.57 kg/m    Subjective:   Patient ID: Jeffrey Rubio, male    DOB: 09-09-1945, 74 y.o.   MRN: [id number]  HPI: Jeffrey Rubio is a 74 y.o. male presenting on 03/18/2020 for Medical Management of Chronic Issues, Diabetes, and Hypertension   HPI Type 2 diabetes mellitus Patient comes in today for recheck of his diabetes. Patient has been currently taking Ozempic and Metformin, A1c 7.0. Patient is currently on an ACE inhibitor/ARB. Patient has seen an ophthalmologist this year. Patient denies any issues with their feet. The symptom started onset as an adult hypertension and hyperlipidemia ARE RELATED TO DM   Hypertension Patient is currently on amlodipine and irbesartan, and their blood pressure today is 130/85. Patient denies any lightheadedness or dizziness. Patient denies headaches, blurred vision, chest pains, shortness of breath, or weakness. Denies any side effects from medication and is content with current medication.   Hyperlipidemia Patient is coming in for recheck of his hyperlipidemia. The patient is currently taking pravastatin. They deny any issues with myalgias or history of liver damage from it. They deny any focal numbness or weakness or chest pain.   Patient still does the occasional dizziness with his vertigo and he had 1 severe episode a couple weeks ago.  We discussed possibly going to neurology, he is doing the maneuvers and he is doing the meclizine but if it worsens we will go see neurology.  Relevant past medical, surgical, family and social history reviewed and updated as indicated. Interim medical history since our last visit reviewed. Allergies and medications reviewed and updated.  Review of Systems  Constitutional: Negative for chills and fever.  Eyes: Negative for visual disturbance.  Respiratory: Negative for shortness of breath and  wheezing.   Cardiovascular: Negative for chest pain and leg swelling.  Musculoskeletal: Negative for back pain and gait problem.  Skin: Negative for rash.  Neurological: Positive for dizziness. Negative for weakness and light-headedness.  All other systems reviewed and are negative.   Per HPI unless specifically indicated above   Allergies as of 03/18/2020      Reactions   Vilazodone Hcl    Other reaction(s): Other      Medication List       Accurate as of March 18, 2020 10:33 AM. If you have any questions, ask your nurse or doctor.        Accu-Chek Aviva Plus test strip Generic drug: glucose blood Test BS daily Dx E11.69   Accu-Chek Aviva Plus w/Device Kit USE to check blood sugars daily   accu-chek soft touch lancets Check BS daily   acetaminophen 500 MG tablet Commonly known as: TYLENOL Take 500 mg by mouth every 6 (six) hours as needed for mild pain or moderate pain.   amLODipine 5 MG tablet Commonly known as: NORVASC Take 1 tablet (5 mg total) by mouth daily.   aspirin 81 MG tablet Take 81 mg by mouth daily.   EQL Super Omega-3 Caps Take 4 capsules by mouth daily. 2 capsules in morning and 2 capsules in the evening   famotidine 20 MG tablet Commonly known as: PEPCID TAKE 1 TABLET BY MOUTH TWICE A DAY   irbesartan 300 MG tablet Commonly known as: Avapro Take 1 tablet (300 mg total) by mouth daily.   magnesium  gluconate 500 MG tablet Commonly known as: MAGONATE Take 500 mg by mouth every other day.   meclizine 25 MG tablet Commonly known as: ANTIVERT Take 1 tablet (25 mg total) by mouth 3 (three) times daily as needed for dizziness.   metFORMIN 500 MG tablet Commonly known as: GLUCOPHAGE Take 1 tablet (500 mg total) by mouth 2 (two) times daily with a meal.   Ozempic (0.25 or 0.5 MG/DOSE) 2 MG/1.5ML Sopn Generic drug: Semaglutide(0.25 or 0.5MG/DOS) Inject 0.375 mLs (0.5 mg total) into the skin once a week.   pravastatin 20 MG  tablet Commonly known as: PRAVACHOL TAKE 1 TABLET DAILY   PROBIOTIC-10 PO Take by mouth daily.   traMADol 50 MG tablet Commonly known as: ULTRAM Take 1 tablet (50 mg total) by mouth every 12 (twelve) hours as needed.   vitamin B-12 1000 MCG tablet Commonly known as: CYANOCOBALAMIN Take 1,000 mcg by mouth every other day.   Vitamin D3 50 MCG (2000 UT) Tabs Take 4,000 Units by mouth every morning.   zinc gluconate 50 MG tablet Take 50 mg by mouth daily.        Objective:   BP 130/85   Pulse 68   Temp 98 F (36.7 C)   Ht '5\' 7"'  (1.702 m)   Wt 176 lb (79.8 kg)   SpO2 98%   BMI 27.57 kg/m   Wt Readings from Last 3 Encounters:  03/18/20 176 lb (79.8 kg)  12/31/19 172 lb (78 kg)  09/29/19 175 lb (79.4 kg)    Physical Exam Vitals and nursing note reviewed.  Constitutional:      General: He is not in acute distress.    Appearance: He is well-developed and well-nourished. He is not diaphoretic.  Eyes:     General: No scleral icterus.    Extraocular Movements: EOM normal.     Conjunctiva/sclera: Conjunctivae normal.  Neck:     Thyroid: No thyromegaly.  Cardiovascular:     Rate and Rhythm: Normal rate and regular rhythm.     Pulses: Intact distal pulses.     Heart sounds: Normal heart sounds. No murmur heard.   Pulmonary:     Effort: Pulmonary effort is normal. No respiratory distress.     Breath sounds: Normal breath sounds. No wheezing.  Musculoskeletal:        General: No edema. Normal range of motion.     Cervical back: Neck supple.  Lymphadenopathy:     Cervical: No cervical adenopathy.  Skin:    General: Skin is warm and dry.     Findings: No rash.  Neurological:     Mental Status: He is alert and oriented to person, place, and time.     Coordination: Coordination normal.  Psychiatric:        Mood and Affect: Mood and affect normal.        Behavior: Behavior normal.     Results for orders placed or performed in visit on 12/31/19  Bayer DCA Hb  A1c Waived  Result Value Ref Range   HB A1C (BAYER DCA - WAIVED) 6.9 <7.0 %    Assessment & Plan:   Problem List Items Addressed This Visit      Cardiovascular and Mediastinum   Hypertension associated with diabetes ([place])   Relevant Medications   amLODipine (NORVASC) 5 MG tablet   irbesartan (AVAPRO) 300 MG tablet   metFORMIN (GLUCOPHAGE) 500 MG tablet   pravastatin (PRAVACHOL) 20 MG tablet   Semaglutide,0.25 or 0.5MG/DOS, (OZEMPIC, 0.25 OR  0.5 MG/DOSE,) 2 MG/1.5ML SOPN   Other Relevant Orders   CMP14+EGFR   Lipid panel     Endocrine   Hyperlipidemia associated with type 2 diabetes mellitus (HCC)   Relevant Medications   irbesartan (AVAPRO) 300 MG tablet   metFORMIN (GLUCOPHAGE) 500 MG tablet   pravastatin (PRAVACHOL) 20 MG tablet   Semaglutide,0.25 or 0.5MG/DOS, (OZEMPIC, 0.25 OR 0.5 MG/DOSE,) 2 MG/1.5ML SOPN   Other Relevant Orders   Lipid panel   DM (diabetes mellitus) ([place]) - Primary   Relevant Medications   irbesartan (AVAPRO) 300 MG tablet   metFORMIN (GLUCOPHAGE) 500 MG tablet   pravastatin (PRAVACHOL) 20 MG tablet   Semaglutide,0.25 or 0.5MG/DOS, (OZEMPIC, 0.25 OR 0.5 MG/DOSE,) 2 MG/1.5ML SOPN   Other Relevant Orders   Lipid panel   Bayer DCA Hb A1c Waived (Completed)   CBC with Differential/Platelet   CMP14+EGFR    Other Visit Diagnoses    Chronic bilateral low back pain without sciatica       Long term prescription opiate use       Vitamin D deficiency       Relevant Orders   VITAMIN D 25 Hydroxy (Vit-D Deficiency, Fractures)      No change in medication, A1c 7.0, blood pressure looks good.  See back in for follow-up Follow up plan: Return in about 3 months (around 06/16/2020), or if symptoms worsen or fail to improve, for Diabetes and hypertension.  Counseling provided for all of the vaccine components Orders Placed This Encounter  Procedures  . Bayer [facility] Hb A1c [place], MD [facility] 03/18/2020, 10:33  AM

## 2020-03-19 ENCOUNTER — Other Ambulatory Visit: Payer: Self-pay | Admitting: *Deleted

## 2020-03-19 LAB — CMP14+EGFR
ALT: 20 IU/L (ref 0–44)
AST: 15 IU/L (ref 0–40)
Albumin/Globulin Ratio: 1.7 (ref 1.2–2.2)
Albumin: 4.3 g/dL (ref 3.7–4.7)
Alkaline Phosphatase: 88 IU/L (ref 44–121)
BUN/Creatinine Ratio: 18 (ref 10–24)
BUN: 27 mg/dL (ref 8–27)
Bilirubin Total: 0.4 mg/dL (ref 0.0–1.2)
CO2: 23 mmol/L (ref 20–29)
Calcium: 10.3 mg/dL — ABNORMAL HIGH (ref 8.6–10.2)
Chloride: 100 mmol/L (ref 96–106)
Creatinine, Ser: 1.47 mg/dL — ABNORMAL HIGH (ref 0.76–1.27)
GFR calc Af Amer: 54 mL/min/{1.73_m2} — ABNORMAL LOW (ref >59)
GFR calc non Af Amer: 46 mL/min/{1.73_m2} — ABNORMAL LOW (ref >59)
Globulin, Total: 2.6 g/dL (ref 1.5–4.5)
Glucose: 133 mg/dL — ABNORMAL HIGH (ref 65–99)
Potassium: 5.4 mmol/L — ABNORMAL HIGH (ref 3.5–5.2)
Sodium: 142 mmol/L (ref 134–144)
Total Protein: 6.9 g/dL (ref 6.0–8.5)

## 2020-03-19 LAB — CBC WITH DIFFERENTIAL/PLATELET
Basophils Absolute: 0.1 10*3/uL (ref 0.0–0.2)
Basos: 1 %
EOS (ABSOLUTE): 0 10*3/uL (ref 0.0–0.4)
Eos: 1 %
Hematocrit: 42.5 % (ref 37.5–51.0)
Hemoglobin: 14.4 g/dL (ref 13.0–17.7)
Immature Grans (Abs): 0.1 10*3/uL (ref 0.0–0.1)
Immature Granulocytes: 1 %
Lymphocytes Absolute: 2.9 10*3/uL (ref 0.7–3.1)
Lymphs: 35 %
MCH: 30.5 pg (ref 26.6–33.0)
MCHC: 33.9 g/dL (ref 31.5–35.7)
MCV: 90 fL (ref 79–97)
Monocytes Absolute: 0.7 10*3/uL (ref 0.1–0.9)
Monocytes: 9 %
Neutrophils Absolute: 4.6 10*3/uL (ref 1.4–7.0)
Neutrophils: 53 %
Platelets: 270 10*3/uL (ref 150–450)
RBC: 4.72 x10E6/uL (ref 4.14–5.80)
RDW: 13 % (ref 11.6–15.4)
WBC: 8.3 10*3/uL (ref 3.4–10.8)

## 2020-03-19 LAB — LIPID PANEL
Chol/HDL Ratio: 3.8 ratio (ref 0.0–5.0)
Cholesterol, Total: 178 mg/dL (ref 100–199)
HDL: 47 mg/dL (ref >39)
LDL Chol Calc (NIH): 90 mg/dL (ref 0–99)
Triglycerides: 244 mg/dL — ABNORMAL HIGH (ref 0–149)
VLDL Cholesterol Cal: 41 mg/dL — ABNORMAL HIGH (ref 5–40)

## 2020-03-19 LAB — VITAMIN D 25 HYDROXY (VIT D DEFICIENCY, FRACTURES): Vit D, 25-Hydroxy: 38.1 ng/mL (ref 30.0–100.0)

## 2020-03-19 MED ORDER — TRUEPLUS LANCETS 28G MISC: 3 refills | Status: DC

## 2020-03-19 NOTE — Telephone Encounter (Signed)
ACCU check SOFT TOUCH Lancet  Is MFR discontinued   Please send new RX for replacement to [facility].   Note: their recommendation is Trueplus 28g sterile lancet.  Will pend

## 2020-03-22 ENCOUNTER — Other Ambulatory Visit: Payer: Self-pay | Admitting: *Deleted

## 2020-03-22 DIAGNOSIS — E875 Hyperkalemia: Secondary | ICD-10-CM

## 2020-03-23 MED ORDER — TRUEPLUS LANCETS 28G MISC: 3 refills | Status: DC

## 2020-03-23 NOTE — Addendum Note (Signed)
Addended by: Antonietta Barcelona D on: 03/23/2020 09:09 AM   Modules accepted: Orders

## 2020-03-23 NOTE — Telephone Encounter (Signed)
Replacement for Accucheck soft touch lancets have not been received Recent to pharmacy

## 2020-04-01 ENCOUNTER — Ambulatory Visit (INDEPENDENT_AMBULATORY_CARE_PROVIDER_SITE_OTHER): Payer: Medicare Other | Admitting: Pharmacist

## 2020-04-01 ENCOUNTER — Encounter: Payer: Self-pay | Admitting: Pharmacist

## 2020-04-01 ENCOUNTER — Other Ambulatory Visit: Payer: Self-pay

## 2020-04-01 VITALS — BP 118/70

## 2020-04-01 DIAGNOSIS — E119 Type 2 diabetes mellitus without complications: Secondary | ICD-10-CM | POA: Diagnosis not present

## 2020-04-01 DIAGNOSIS — E875 Hyperkalemia: Secondary | ICD-10-CM | POA: Diagnosis not present

## 2020-04-01 LAB — BMP8+EGFR
BUN/Creatinine Ratio: 16 (ref 10–24)
BUN: 21 mg/dL (ref 8–27)
CO2: 22 mmol/L (ref 20–29)
Calcium: 9.8 mg/dL (ref 8.6–10.2)
Chloride: 103 mmol/L (ref 96–106)
Creatinine, Ser: 1.32 mg/dL — ABNORMAL HIGH (ref 0.76–1.27)
GFR calc Af Amer: 61 mL/min/{1.73_m2} (ref >59)
GFR calc non Af Amer: 53 mL/min/{1.73_m2} — ABNORMAL LOW (ref >59)
Glucose: 146 mg/dL — ABNORMAL HIGH (ref 65–99)
Potassium: 5 mmol/L (ref 3.5–5.2)
Sodium: 139 mmol/L (ref 134–144)

## 2020-04-01 NOTE — Progress Notes (Signed)
    04/01/2020 Name: Jeffrey Rubio MRN: [id number] DOB: 28-Jan-1946   S:  42 YOM Presents for diabetes evaluation, education, and management Patient was referred and last seen by Primary Care Provider on 03/18/2020  Insurance coverage/medication affordability: MEDICARE/MEDICAID  Patient reports adherence with medications. . Current diabetes medications include: ozempic, metformin . Current hypertension medications include: amlodipine, irbesartan Goal 130/80 . Current hyperlipidemia medications include: pravastatin  Patient denies hypoglycemic events.    O:  Lab Results  Component Value Date   HGBA1C 7.0 (H) 03/18/2020    Vitals:   04/01/20 1137  BP: 118/70     Lipid Panel     Component Value Date/Time   CHOL 178 03/18/2020 1103   CHOL 161 10/22/2012 1032   TRIG 244 (H) 03/18/2020 1103   TRIG 211 (H) 06/03/2013 0925   TRIG 175 (H) 10/22/2012 1032   HDL 47 03/18/2020 1103   HDL 45 06/03/2013 0925   HDL 46 10/22/2012 1032   CHOLHDL 3.8 03/18/2020 1103   LDLCALC 90 03/18/2020 1103   LDLCALC 63 06/03/2013 0925   LDLCALC 80 10/22/2012 1032    Home fasting blood sugars: <130  2 hour post-meal/random blood sugars: N/A.   Clinical Atherosclerotic Cardiovascular Disease (ASCVD): No   The 10-year ASCVD risk score Denman George DC Jr., et al., 2013) is: 39.9%   Values used to calculate the score:     Age: 12 years     Sex: Male     Is Non-Hispanic African American: No     Diabetic: Yes     Tobacco smoker: No     Systolic Blood Pressure: 118 mmHg     Is BP treated: Yes     HDL Cholesterol: 47 mg/dL     Total Cholesterol: 178 mg/dL    A/P:  Diabetes V7QI currently CONTROLLED. Patient is adherent with medication.   -CONTINUE OZEMPIC 0.5mg  sq weekly             Application for [employer] patient assistance filled out with patient, awaiting PCP sign  & patient financials  4 MONTH SUPPLY WILL SHIP TO PCP OFFICE  PATIENT TOLERATING OKAY--STILL SOME GI/HEARTBURN,  BUT IMPROVED  DENIES HISTORY OF THYROID/MEDULLARY CANCER  -Continue metformin-GFR 46 ON REDUCED DOSE  -CONSIDER SGLT2 AT FOLLOW FOR T2DM AND CKD  -Extensively discussed pathophysiology of diabetes, recommended lifestyle interventions, dietary effects on blood sugar control  -Counseled on s/sx of and management of hypoglycemia  -Next A1C anticipated 06/2020.   Written patient instructions provided.  Total time in face to face counseling 25 minutes.   Follow up PCP Clinic Visit ON 06/2018.    Kieth Brightly, PharmD, BCPS Clinical Pharmacist, [facility]  II Phone [phone]

## 2020-04-05 ENCOUNTER — Ambulatory Visit (INDEPENDENT_AMBULATORY_CARE_PROVIDER_SITE_OTHER): Payer: Medicare Other | Admitting: *Deleted

## 2020-04-05 ENCOUNTER — Telehealth: Payer: Self-pay | Admitting: Family Medicine

## 2020-04-05 DIAGNOSIS — Z0000 Encounter for general adult medical examination without abnormal findings: Secondary | ICD-10-CM

## 2020-04-05 NOTE — Telephone Encounter (Signed)
Barbara Borland 12/10/1950 is on the phone about her AWV, she said that her power had been out and that she wanted to see if anyone had called yet. Her husband also had an AWV at 10:30 for Nyzier Holte 11/22/1945 but that was when the power was out 

## 2020-04-05 NOTE — Progress Notes (Signed)
MEDICARE ANNUAL WELLNESS VISIT  04/05/2020  Telephone Visit Disclaimer This Medicare AWV was conducted by telephone due to national recommendations for restrictions regarding the COVID-19 Pandemic (e.g. social distancing).  I verified, using two identifiers, that I am speaking with Jeffrey Rubio or their authorized healthcare agent. I discussed the limitations, risks, security, and privacy concerns of performing an evaluation and management service by telephone and the potential availability of an in-person appointment in the future. The patient expressed understanding and agreed to proceed.  Location of Patient: Home  Location of Provider (nurse):  [facility]  Subjective:    Jeffrey Rubio is a 75 y.o. male patient of Dettinger, Fransisca Kaufmann, MD who had a Medicare Annual Wellness Visit today via telephone. 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 He lives with his spouse. He has 2 children. He reports that he is socially active and does interact with friends/family regularly. he is minimally physically active and enjoys fishing, gardening, working on the farm, going to [employer], and spending time with his family.  Patient Care Team: Dettinger, Fransisca Kaufmann, MD as PCP - General (Family Medicine) Eulis Canner (Internal Medicine) Leroy Sea, MD as Referring Physician (Urology) Minus Breeding, MD as Consulting Physician (Cardiology) Danice Goltz, MD as Consulting Physician (Ophthalmology) Jose Persia Kathalene Frames, MD as Referring Physician (Gastroenterology)  Advanced Directives 04/05/2020 03/21/2017 01/11/2017 01/10/2017 03/08/2016 01/01/2015  Does Patient Have a Medical Advance Directive? No No No No No No  Would patient like information on creating a medical advance directive? Yes (MAU/Ambulatory/Procedural Areas - Information given) Yes (ED - Information included in AVS) No - Patient declined - Yes (ED - Information included in AVS) No - patient  declined information    Hospital Utilization Over the Past 12 Months: # of hospitalizations or ER visits: 0 # of surgeries: 0  Review of Systems    Patient reports that his overall health is unchanged compared to last year.  History obtained from chart review and the patient  Patient Reported Readings (BP, Pulse, CBG, Weight, etc) none  Pain Assessment       Current Medications & Allergies (verified) Allergies as of 04/05/2020   No Active Allergies     Medication List       Accurate as of April 05, 2020 12:51 PM. If you have any questions, ask your nurse or doctor.        Accu-Chek Aviva Plus test strip Generic drug: glucose blood Test BS daily Dx E11.69   Accu-Chek Aviva Plus w/Device Kit USE to check blood sugars daily   acetaminophen 500 MG tablet Commonly known as: TYLENOL Take 500 mg by mouth every 6 (six) hours as needed for mild pain or moderate pain.   amLODipine 5 MG tablet Commonly known as: NORVASC Take 1 tablet (5 mg total) by mouth daily.   aspirin 81 MG tablet Take 81 mg by mouth daily.   D-MANNOSE PO   famotidine 20 MG tablet Commonly known as: PEPCID Take 1 tablet (20 mg total) by mouth 2 (two) times daily.   irbesartan 300 MG tablet Commonly known as: Avapro Take 1 tablet (300 mg total) by mouth daily.   magnesium gluconate 500 MG tablet Commonly known as: MAGONATE Take 500 mg by mouth every other day.   meclizine 25 MG tablet Commonly known as: ANTIVERT Take 1 tablet (25 mg total) by mouth 3 (three) times daily as needed for dizziness.   Metamucil 28.3 %  Powd Generic drug: Psyllium   metFORMIN 500 MG tablet Commonly known as: GLUCOPHAGE Take 1 tablet (500 mg total) by mouth 2 (two) times daily with a meal.   Ozempic (0.25 or 0.5 MG/DOSE) 2 MG/1.5ML Sopn Generic drug: Semaglutide(0.25 or 0.5MG/DOS) Inject 0.5 mg into the skin once a week.   pravastatin 20 MG tablet Commonly known as: PRAVACHOL Take 1 tablet (20 mg total)  by mouth daily.   PROBIOTIC-10 PO Take by mouth daily.   traMADol 50 MG tablet Commonly known as: ULTRAM Take 1 tablet (50 mg total) by mouth every 12 (twelve) hours as needed.   TRUEplus Lancets 28G Misc Use to check blood sugar once daily and PRN Dx. E11.9   vitamin B-12 1000 MCG tablet Commonly known as: CYANOCOBALAMIN Take 1,000 mcg by mouth every other day.   Vitamin D3 50 MCG (2000 UT) Tabs Take 4,000 Units by mouth every morning.   zinc gluconate 50 MG tablet Take 50 mg by mouth daily.       History (reviewed): Past Medical History:  Diagnosis Date  . Asthmatic bronchitis   . BPH (benign prostatic hypertrophy)   . Cataracts, bilateral   . Colon polyps   . Diabetes mellitus   . Diverticulosis   . Fibromyalgia   . Gout   . History of nephrectomy, unilateral    left   . Hyperlipidemia   . Hypertension   . Single kidney    Past Surgical History:  Procedure Laterality Date  . APPENDECTOMY    . CHOLECYSTECTOMY    . COLONOSCOPY W/ POLYPECTOMY    . EYE SURGERY     catarat - right   . LEFT HEART CATH AND CORONARY ANGIOGRAPHY N/A 01/11/2017   Procedure: LEFT HEART CATH AND CORONARY ANGIOGRAPHY;  Surgeon: Martinique, Peter M, MD;  Location: [facility];  Service: Cardiovascular;  Laterality: N/A;  . NEPHRECTOMY  1996   left , secondary to nephrolithiasis    Family History  Problem Relation Age of Onset  . Coronary artery disease Other        family history   . Hypertension Other        family history   . Alzheimer's disease Mother 38  . Cancer Father        prostate  . Stroke Father   . CAD Father 20       CABG  . Hypertension Sister   . Hypercalcemia Brother   . Hypertension Brother   . Kidney Stones Son   . Breast cancer Maternal Grandmother   . Diabetes Paternal Grandmother   . Other Paternal Grandmother        Crite's disease  . Suicidality Paternal Grandfather    Social History   Socioeconomic History  . Marital status: Married     Spouse name: Not on file  . Number of children: 2  . Years of education: Not on file  . Highest education level: Not on file  Occupational History  . Not on file  Tobacco Use  . Smoking status: Former Smoker    Packs/day: 1.00    Years: 8.00    Pack years: 8.00    Types: Cigarettes    Quit date: 04/03/1970    Years since quitting: 50.0  . Smokeless tobacco: Never Used  Vaping Use  . Vaping Use: Never used  Substance and Sexual Activity  . Alcohol use: No  . Drug use: No  . Sexual activity: Not on file  Other Topics Concern  .  Not on file  Social History Narrative   Lives with wife on a farm.     Social Determinants of Health   Financial Resource Strain: Low Risk   . Difficulty of Paying Living Expenses: Not hard at all  Food Insecurity: No Food Insecurity  . Worried About Charity fundraiser in the Last Year: Never true  . Ran Out of Food in the Last Year: Never true  Transportation Needs: No Transportation Needs  . Lack of Transportation (Medical): No  . Lack of Transportation (Non-Medical): No  Physical Activity: Sufficiently Active  . Days of Exercise per Week: 7 days  . Minutes of Exercise per Session: 60 min  Stress: No Stress Concern Present  . Feeling of Stress : Not at all  Social Connections: Socially Integrated  . Frequency of Communication with Friends and Family: More than three times a week  . Frequency of Social Gatherings with Friends and Family: More than three times a week  . Attends Religious Services: More than 4 times per year  . Active Member of Clubs or Organizations: Yes  . Attends Archivist Meetings: More than 4 times per year  . Marital Status: Married    Activities of Daily Living In your present state of health, do you have any difficulty performing the following activities: 04/05/2020 10/20/2019  Hearing? Y N  Comment At times -  Vision? N N  Comment Wears glasses has yearly eye exams and wears glasses  Difficulty concentrating  or making decisions? N N  Walking or climbing stairs? N N  Dressing or bathing? N N  Doing errands, shopping? N N  Preparing Food and eating ? N N  Using the Toilet? N N  In the past six months, have you accidently leaked urine? Y N  Comment At times -  Do you have problems with loss of bowel control? N N  Managing your Medications? N N  Managing your Finances? N N  Housekeeping or managing your Housekeeping? N -  Some recent data might be hidden    Patient Education/ Literacy    Exercise Current Exercise Habits: The patient has a physically strenuous job, but has no regular exercise apart from work. 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   Depression Screen PHQ 2/9 Scores 04/05/2020 03/18/2020 12/31/2019 09/29/2019 06/20/2019 03/20/2019 01/09/2019  PHQ - 2 Score 0 0 0 0 0 0 0     Fall Risk Fall Risk  04/05/2020 04/05/2020 03/18/2020 12/31/2019 10/20/2019  Falls in the past year? 0 0 0 0 1  Number falls in past yr: - - - - 0  Injury with Fall? - - - - 0  Risk for fall due to : - - - - History of fall(s);Impaired balance/gait  Follow up - - - - Falls prevention discussed     Objective:  Jeffrey Rubio seemed alert and oriented and he participated appropriately during our telephone visit.  Blood Pressure Weight BMI  BP Readings from Last 3 Encounters:  04/01/20 118/70  03/18/20 130/85  12/31/19 131/82   Wt Readings from Last 3 Encounters:  03/18/20 176 lb (79.8 kg)  12/31/19 172 lb (78 kg)  09/29/19 175 lb (79.4 kg)   BMI Readings from Last 1 Encounters:  03/18/20 27.57  kg/m    *Unable to obtain current vital signs, weight, and BMI due to telephone visit type  Hearing/Vision  . Camren did not seem to have difficulty with hearing/understanding during the  telephone conversation . Reports that he has had a formal eye exam by an eye care professional within the past year . Reports that he has not had a formal hearing evaluation within the past year *Unable to fully assess hearing and vision during telephone visit type  Cognitive Function: 6CIT Screen 04/05/2020  What Year? 0 points  What month? 0 points  What time? 0 points  Count back from 20 0 points  Months in reverse 0 points  Repeat phrase 4 points  Total Score 4   (Normal:0-7, Significant for Dysfunction: >8)  Normal Cognitive Function Screening: Yes   Immunization & Health Maintenance Record Immunization History  Administered Date(s) Administered  . Fluad Quad(high Dose 65+) 02/02/2019, 12/31/2019  . Influenza, High Dose Seasonal PF 01/15/2017, 03/13/2018  . Influenza,inj,Quad PF,6+ Mos 01/28/2013, 01/28/2014, 01/11/2015  . Moderna Sars-Covid-2 Vaccination 05/05/2019, 06/02/2019  . Pneumococcal Conjugate-13 01/28/2013  . Pneumococcal Polysaccharide-23 01/01/2015  . Zoster 01/09/2013  . Zoster Recombinat (Shingrix) 03/21/2017, 09/06/2017    Health Maintenance  Topic Date Due  . TETANUS/TDAP  02/02/2019  . FOOT EXAM  03/19/2020  . COVID-19 Vaccine (3 - Booster for Moderna series) 06/21/2020 (Originally 12/03/2019)  . OPHTHALMOLOGY EXAM  05/26/2020  . HEMOGLOBIN A1C  09/16/2020  . COLONOSCOPY (Pts 45-42yr Insurance coverage will need to be confirmed)  06/18/2024  . INFLUENZA VACCINE  Completed  . Hepatitis C Screening  Completed  . PNA vac Low Risk Adult  Completed       Assessment  This is a routine wellness examination for [facility]  Health Maintenance: Due or Overdue Health Maintenance Due  Topic Date Due  . TETANUS/TDAP  02/02/2019  . FOOT EXAM  03/19/2020    GDaphene Jaegerdoes not need a referral for Community Assistance: Care Management:   no Social Work:    no Prescription Assistance:  no Nutrition/Diabetes Education:  no   Plan:   Personalized Goals Goals Addressed            This Visit's Progress   . AWV       04/05/2020 AWV Goal: Fall Prevention  . Over the next year, patient will decrease their risk for falls by: o Using assistive devices, such as a cane or walker, as needed o Identifying fall risks within their home and correcting them by: - Removing throw rugs - Adding handrails to stairs or ramps - Removing clutter and keeping a clear pathway throughout the home - Increasing light, especially at night - Adding shower handles/bars - Raising toilet seat o Identifying potential personal risk factors for falls: - Medication side effects - Incontinence/urgency - Vestibular dysfunction - Hearing loss - Musculoskeletal disorders - Neurological disorders - Orthostatic hypotension        Personalized Health Maintenance & Screening Recommendations  Td vaccine COVID Booster  Lung Cancer Screening Recommended: no (Low Dose CT Chest recommended if Age 75-80years, 30 pack-year currently smoking OR have quit w/in past 15 years) Hepatitis C Screening recommended: no HIV Screening recommended: no  Advanced Directives: Written information was not prepared per patient's request.  Referrals & Orders No orders of the defined types were placed in this encounter.   Follow-up Plan . Follow-up with Dettinger, JFransisca Kaufmann MD as planned . Schedule hearing exam . AVS printed and mailed  to patient   I have personally reviewed and noted the following in the patient's chart:   . Medical and social history . Use of alcohol, tobacco or illicit drugs  . Current medications and supplements . Functional ability and status . Nutritional status . Physical activity . Advanced directives . List of other physicians . Hospitalizations, surgeries, and ER visits in previous 12 months . Vitals . Screenings to include cognitive, depression, and falls . Referrals and appointments  In addition, I have reviewed and  discussed with Jeffrey Rubio certain preventive protocols, quality metrics, and best practice recommendations. A written personalized care plan for preventive services as well as general preventive health recommendations is available and can be mailed to the patient at his request.      Lynnea Ferrier, LPN  06/05/7423

## 2020-04-05 NOTE — Patient Instructions (Signed)
  MEDICARE ANNUAL WELLNESS VISIT Health Maintenance Summary and Written Plan of Care  [name] ,  Thank you for allowing me to perform your Medicare Annual Wellness Visit and for your ongoing commitment to your health.   Health Maintenance & Immunization History Health Maintenance  Topic Date Due  . TETANUS/TDAP  02/02/2019  . FOOT EXAM  03/19/2020  . COVID-19 Vaccine (3 - Booster for Moderna series) 06/21/2020 (Originally 12/03/2019)  . OPHTHALMOLOGY EXAM  05/26/2020  . HEMOGLOBIN A1C  09/16/2020  . COLONOSCOPY (Pts 45-63yrs Insurance coverage will need to be confirmed)  06/18/2024  . INFLUENZA VACCINE  Completed  . Hepatitis C Screening  Completed  . PNA vac Low Risk Adult  Completed   Immunization History  Administered Date(s) Administered  . Fluad Quad(high Dose 65+) 02/02/2019, 12/31/2019  . Influenza, High Dose Seasonal PF 01/15/2017, 03/13/2018  . Influenza,inj,Quad PF,6+ Mos 01/28/2013, 01/28/2014, 01/11/2015  . Moderna Sars-Covid-2 Vaccination 05/05/2019, 06/02/2019  . Pneumococcal Conjugate-13 01/28/2013  . Pneumococcal Polysaccharide-23 01/01/2015  . Zoster 01/09/2013  . Zoster Recombinat (Shingrix) 03/21/2017, 09/06/2017    These are the patient goals that we discussed: Goals Addressed            This Visit's Progress   . AWV       04/05/2020 AWV Goal: Fall Prevention  . Over the next year, patient will decrease their risk for falls by: o Using assistive devices, such as a cane or walker, as needed o Identifying fall risks within their home and correcting them by: - Removing throw rugs - Adding handrails to stairs or ramps - Removing clutter and keeping a clear pathway throughout the home - Increasing light, especially at night - Adding shower handles/bars - Raising toilet seat o Identifying potential personal risk factors for falls: - Medication side effects - Incontinence/urgency - Vestibular dysfunction - Hearing loss - Musculoskeletal  disorders - Neurological disorders - Orthostatic hypotension          This is a list of Health Maintenance Items that are overdue or due now: Health Maintenance Due  Topic Date Due  . TETANUS/TDAP  02/02/2019  . FOOT EXAM  03/19/2020     Orders/Referrals Placed Today: No orders of the defined types were placed in this encounter.  (Contact our referral department at [phone] if you have not spoken with someone about your referral appointment within the next 5 days)    Follow-up Plan . Follow-up with Dettinger, Elige Radon, MD as planned . Schedule hearing exam

## 2020-04-07 ENCOUNTER — Other Ambulatory Visit: Payer: Self-pay | Admitting: Family Medicine

## 2020-04-07 DIAGNOSIS — E119 Type 2 diabetes mellitus without complications: Secondary | ICD-10-CM

## 2020-04-08 ENCOUNTER — Telehealth: Payer: Self-pay | Admitting: *Deleted

## 2020-04-08 MED ORDER — ACCU-CHEK AVIVA PLUS VI STRP: ORAL_STRIP | 3 refills | Status: DC

## 2020-04-08 NOTE — Telephone Encounter (Signed)
LMOVM for patient to call back with which lancet he would like [place] to send to [facility], the Trueplus lancet 28g or the soft touch mis lancet. The Trueplus is the one that was sent to the mail order the other day which is not covered by the mail order pharmacy

## 2020-04-08 NOTE — Telephone Encounter (Signed)
Pt does not need any lancets at this time, had just received papers from the mail order pharmacy. He does need his test strips, sent refill on these to [facility]

## 2020-04-28 ENCOUNTER — Ambulatory Visit (INDEPENDENT_AMBULATORY_CARE_PROVIDER_SITE_OTHER): Payer: Medicare Other

## 2020-04-28 ENCOUNTER — Other Ambulatory Visit: Payer: Self-pay

## 2020-04-28 DIAGNOSIS — Z23 Encounter for immunization: Secondary | ICD-10-CM

## 2020-04-28 NOTE — Progress Notes (Signed)
   Covid-19 Vaccination Clinic  Name:  Jeffrey Rubio    MRN: [id number] DOB: 1945/10/27  04/28/2020  Jeffrey Rubio was observed post Covid-19 immunization for 15 minutes without incident. He was provided with Vaccine Information Sheet and instruction to access the V-Safe system.   Jeffrey Rubio was instructed to call 911 with any severe reactions post vaccine: Marland Kitchen Difficulty breathing  . Swelling of face and throat  . A fast heartbeat  . A bad rash all over body  . Dizziness and weakness   Immunizations Administered    Name Date Dose VIS Date Route   Moderna Covid-19 Booster Vaccine 04/28/2020 10:37 AM 0.25 mL 01/21/2020 Intramuscular   Manufacturer: Moderna   Lot: [id number]   [place]: [id number]

## 2020-05-12 ENCOUNTER — Telehealth: Payer: Self-pay | Admitting: *Deleted

## 2020-05-12 NOTE — Telephone Encounter (Signed)
[facility] has been trying to get pt on a pt assistance program For ozempic.   They have brought several envelopes by with info about their financials   He is still not approved for PA at this time. No meds have been received - per pt / wife.  Will find these and place in his file from [place] office == will be on Lead desk

## 2020-05-14 NOTE — Telephone Encounter (Signed)
Message left for patient that I have refaxed all paperwork along with income to [employer] for patient assistance.

## 2020-05-20 NOTE — Telephone Encounter (Signed)
Message left for patient that his patient assistance has been approved. We will call patient once medication has arrived to our office.

## 2020-05-26 ENCOUNTER — Telehealth: Payer: Self-pay

## 2020-05-26 DIAGNOSIS — E785 Hyperlipidemia, unspecified: Secondary | ICD-10-CM

## 2020-05-26 DIAGNOSIS — E1169 Type 2 diabetes mellitus with other specified complication: Secondary | ICD-10-CM

## 2020-05-27 MED ORDER — PRAVASTATIN SODIUM 20 MG PO TABS: 20.0000 mg | ORAL_TABLET | Freq: Every day | ORAL | 3 refills | Status: DC

## 2020-05-27 NOTE — Telephone Encounter (Signed)
TC to [facility] they did not receive refill from 03/18/20, resent today LMOVM regarding this

## 2020-06-01 DIAGNOSIS — E119 Type 2 diabetes mellitus without complications: Secondary | ICD-10-CM | POA: Diagnosis not present

## 2020-06-03 ENCOUNTER — Telehealth: Payer: Self-pay | Admitting: *Deleted

## 2020-06-03 NOTE — Telephone Encounter (Signed)
#  5 Ozempic came in for pt - pt assistance   Aware - in fridge with name and dob on bag.

## 2020-06-17 ENCOUNTER — Ambulatory Visit: Payer: Medicare Other | Admitting: Family Medicine

## 2020-06-23 ENCOUNTER — Encounter: Payer: Self-pay | Admitting: Family Medicine

## 2020-06-23 ENCOUNTER — Other Ambulatory Visit: Payer: Self-pay

## 2020-06-23 ENCOUNTER — Ambulatory Visit (INDEPENDENT_AMBULATORY_CARE_PROVIDER_SITE_OTHER): Payer: Medicare Other | Admitting: Family Medicine

## 2020-06-23 VITALS — BP 138/86 | HR 72 | Ht 67.0 in | Wt 182.0 lb

## 2020-06-23 DIAGNOSIS — E785 Hyperlipidemia, unspecified: Secondary | ICD-10-CM

## 2020-06-23 DIAGNOSIS — E119 Type 2 diabetes mellitus without complications: Secondary | ICD-10-CM | POA: Diagnosis not present

## 2020-06-23 DIAGNOSIS — I152 Hypertension secondary to endocrine disorders: Secondary | ICD-10-CM | POA: Diagnosis not present

## 2020-06-23 DIAGNOSIS — E1169 Type 2 diabetes mellitus with other specified complication: Secondary | ICD-10-CM

## 2020-06-23 DIAGNOSIS — E1159 Type 2 diabetes mellitus with other circulatory complications: Secondary | ICD-10-CM | POA: Diagnosis not present

## 2020-06-23 LAB — BAYER DCA HB A1C WAIVED: HB A1C (BAYER DCA - WAIVED): 7.4 % — ABNORMAL HIGH (ref <7.0)

## 2020-06-23 NOTE — Progress Notes (Signed)
BP 138/86   Pulse 72   Ht _0  (1.702 m)   Wt 182 lb (82.6 kg)   SpO2 96%   BMI 28.51 kg/m    Subjective:   Patient ID: Jeffrey Rubio, male    DOB: 1945-05-07, 75 y.o.   MRN: [id number]  HPI: [name] is a 75 y.o. male presenting on 06/23/2020 for Medical Management of Chronic Issues, Diabetes, and Hypertension   HPI Type 2 diabetes mellitus Patient comes in today for recheck of his diabetes. Patient has been currently taking Ozempic and Metformin. Patient is currently on an ACE inhibitor/ARB. Patient has not seen an ophthalmologist this year. Patient denies any issues with their feet. The symptom started onset as an adult hypertension and hyperlipidemia ARE RELATED TO DM   Hypertension Patient is currently on amlodipine and irbesartan, and their blood pressure today is 138/86. Patient denies any lightheadedness or dizziness. Patient denies headaches, blurred vision, chest pains, shortness of breath, or weakness. Denies any side effects from medication and is content with current medication.   Hyperlipidemia Patient is coming in for recheck of his hyperlipidemia. The patient is currently taking pravastatin. They deny any issues with myalgias or history of liver damage from it. They deny any focal numbness or weakness or chest pain.   Patient's energy is down and wants to be tested for testosterone next time with his normal blood work.  Relevant past medical, surgical, family and social history reviewed and updated as indicated. Interim medical history since our last visit reviewed. Allergies and medications reviewed and updated.  Review of Systems  Constitutional: Negative for chills and fever.  Respiratory: Negative for shortness of breath and wheezing.   Cardiovascular: Negative for chest pain and leg swelling.  Musculoskeletal: Negative for back pain and gait problem.  Skin: Negative for rash.  Neurological: Negative for dizziness, weakness and  light-headedness.  All other systems reviewed and are negative.   Per HPI unless specifically indicated above   Allergies as of 06/23/2020   No Known Allergies     Medication List       Accurate as of June 23, 2020 12:02 PM. If you have any questions, ask your nurse or doctor.        STOP taking these medications   D-MANNOSE PO Stopped by: Fransisca Kaufmann Dettinger, MD     TAKE these medications   Accu-Chek Aviva Plus test strip Generic drug: glucose blood Test BS daily Dx E11.69   Accu-Chek Aviva Plus w/Device Kit USE to check blood sugars daily   acetaminophen 500 MG tablet Commonly known as: TYLENOL Take 500 mg by mouth every 6 (six) hours as needed for mild pain or moderate pain.   amLODipine 5 MG tablet Commonly known as: NORVASC Take 1 tablet (5 mg total) by mouth daily.   aspirin 81 MG tablet Take 81 mg by mouth daily.   famotidine 20 MG tablet Commonly known as: PEPCID Take 1 tablet (20 mg total) by mouth 2 (two) times daily.   irbesartan 300 MG tablet Commonly known as: Avapro Take 1 tablet (300 mg total) by mouth daily.   magnesium gluconate 500 MG tablet Commonly known as: MAGONATE Take 500 mg by mouth every other day.   meclizine 25 MG tablet Commonly known as: ANTIVERT Take 1 tablet (25 mg total) by mouth 3 (three) times daily as needed for dizziness.   Metamucil 28.3 % Powd Generic drug: Psyllium   metFORMIN 500 MG tablet Commonly known as:  GLUCOPHAGE TAKE 1 TABLET BY MOUTH 2 TIMES DAILY WITH A MEAL.   Ozempic (0.25 or 0.5 MG/DOSE) 2 MG/1.5ML Sopn Generic drug: Semaglutide(0.25 or 0.5MG/DOS) Inject 0.5 mg into the skin once a week.   pravastatin 20 MG tablet Commonly known as: PRAVACHOL Take 1 tablet (20 mg total) by mouth daily.   PROBIOTIC-10 PO Take by mouth daily.   traMADol 50 MG tablet Commonly known as: ULTRAM Take 1 tablet (50 mg total) by mouth every 12 (twelve) hours as needed.   TRUEplus Lancets 28G Misc Use to check  blood sugar once daily and PRN Dx. E11.9   vitamin B-12 1000 MCG tablet Commonly known as: CYANOCOBALAMIN Take 1,000 mcg by mouth every other day.   Vitamin D3 50 MCG (2000 UT) Tabs Take 4,000 Units by mouth every morning.   zinc gluconate 50 MG tablet Take 50 mg by mouth daily.        Objective:   BP 138/86   Pulse 72   Ht _0  (1.702 m)   Wt 182 lb (82.6 kg)   SpO2 96%   BMI 28.51 kg/m   Wt Readings from Last 3 Encounters:  06/23/20 182 lb (82.6 kg)  03/18/20 176 lb (79.8 kg)  12/31/19 172 lb (78 kg)    Physical Exam Vitals and nursing note reviewed.  Constitutional:      General: He is not in acute distress.    Appearance: He is well-developed. He is not diaphoretic.  Eyes:     General: No scleral icterus.    Conjunctiva/sclera: Conjunctivae normal.  Neck:     Thyroid: No thyromegaly.  Cardiovascular:     Rate and Rhythm: Normal rate and regular rhythm.     Heart sounds: Normal heart sounds. No murmur heard.   Pulmonary:     Effort: Pulmonary effort is normal. No respiratory distress.     Breath sounds: Normal breath sounds. No wheezing.  Musculoskeletal:        General: Normal range of motion.     Cervical back: Neck supple.  Lymphadenopathy:     Cervical: No cervical adenopathy.  Skin:    General: Skin is warm and dry.     Findings: No rash.  Neurological:     Mental Status: He is alert and oriented to person, place, and time.     Coordination: Coordination normal.  Psychiatric:        Behavior: Behavior normal.     Results for orders placed or performed in visit on 06/23/20  Bayer DCA Hb A1c Waived  Result Value Ref Range   HB A1C (BAYER DCA - WAIVED) 7.4 (H) <7.0 %    Assessment & Plan:   Problem List Items Addressed This Visit      Cardiovascular and Mediastinum   Hypertension associated with diabetes ([place])     Endocrine   Hyperlipidemia associated with type 2 diabetes mellitus ([place]) - Primary   Relevant Orders   CBC with  Differential/Platelet   CMP14+EGFR   Bayer DCA Hb A1c Waived (Completed)   Lipid panel   DM (diabetes mellitus) ([place])   Relevant Orders   CBC with Differential/Platelet   CMP14+EGFR   Bayer DCA Hb A1c Waived (Completed)   Lipid panel      Blood sugars running up some, A1c 7.4, he wants to focus on diet being more active and then will come back in 3 months and recheck.  Patient has been having more GERD but has not taken his famotidine consistently  for recommended take it twice a day 30 minutes before breakfast and 30 before dinner and see if that improves. Follow up plan: Return in about 3 months (around 09/23/2020), or if symptoms worsen or fail to improve, for Diabetes.  Counseling provided for all of the vaccine components Orders Placed This Encounter  Procedures  . CBC with Differential/Platelet  . CMP14+EGFR  . Bayer DCA Hb A1c Waived  . Lipid panel    Caryl Pina, MD [facility] 06/23/2020, 12:02 PM

## 2020-06-24 LAB — CMP14+EGFR
ALT: 24 IU/L (ref 0–44)
AST: 18 IU/L (ref 0–40)
Albumin/Globulin Ratio: 1.7 (ref 1.2–2.2)
Albumin: 4.3 g/dL (ref 3.7–4.7)
Alkaline Phosphatase: 92 IU/L (ref 44–121)
BUN/Creatinine Ratio: 14 (ref 10–24)
BUN: 21 mg/dL (ref 8–27)
Bilirubin Total: 0.4 mg/dL (ref 0.0–1.2)
CO2: 22 mmol/L (ref 20–29)
Calcium: 10 mg/dL (ref 8.6–10.2)
Chloride: 98 mmol/L (ref 96–106)
Creatinine, Ser: 1.5 mg/dL — ABNORMAL HIGH (ref 0.76–1.27)
Globulin, Total: 2.5 g/dL (ref 1.5–4.5)
Glucose: 144 mg/dL — ABNORMAL HIGH (ref 65–99)
Potassium: 4.6 mmol/L (ref 3.5–5.2)
Sodium: 138 mmol/L (ref 134–144)
Total Protein: 6.8 g/dL (ref 6.0–8.5)
eGFR: 49 mL/min/{1.73_m2} — ABNORMAL LOW (ref >59)

## 2020-06-24 LAB — CBC WITH DIFFERENTIAL/PLATELET
Basophils Absolute: 0 10*3/uL (ref 0.0–0.2)
Basos: 0 %
EOS (ABSOLUTE): 0.1 10*3/uL (ref 0.0–0.4)
Eos: 1 %
Hematocrit: 38.8 % (ref 37.5–51.0)
Hemoglobin: 13.2 g/dL (ref 13.0–17.7)
Immature Grans (Abs): 0 10*3/uL (ref 0.0–0.1)
Immature Granulocytes: 1 %
Lymphocytes Absolute: 2.6 10*3/uL (ref 0.7–3.1)
Lymphs: 32 %
MCH: 30.1 pg (ref 26.6–33.0)
MCHC: 34 g/dL (ref 31.5–35.7)
MCV: 89 fL (ref 79–97)
Monocytes Absolute: 0.8 10*3/uL (ref 0.1–0.9)
Monocytes: 9 %
Neutrophils Absolute: 4.7 10*3/uL (ref 1.4–7.0)
Neutrophils: 57 %
Platelets: 238 10*3/uL (ref 150–450)
RBC: 4.38 x10E6/uL (ref 4.14–5.80)
RDW: 13.1 % (ref 11.6–15.4)
WBC: 8.2 10*3/uL (ref 3.4–10.8)

## 2020-06-24 LAB — LIPID PANEL
Chol/HDL Ratio: 4.1 ratio (ref 0.0–5.0)
Cholesterol, Total: 190 mg/dL (ref 100–199)
HDL: 46 mg/dL (ref >39)
LDL Chol Calc (NIH): 86 mg/dL (ref 0–99)
Triglycerides: 356 mg/dL — ABNORMAL HIGH (ref 0–149)
VLDL Cholesterol Cal: 58 mg/dL — ABNORMAL HIGH (ref 5–40)

## 2020-07-15 ENCOUNTER — Ambulatory Visit: Payer: Medicare Other | Admitting: Family Medicine

## 2020-08-07 ENCOUNTER — Other Ambulatory Visit: Payer: Self-pay | Admitting: Family Medicine

## 2020-08-26 NOTE — Progress Notes (Unsigned)
Have received his patient assistance, 5 ozempic prefilled pens. pt is aware

## 2020-09-27 ENCOUNTER — Other Ambulatory Visit: Payer: Self-pay

## 2020-09-27 ENCOUNTER — Encounter: Payer: Self-pay | Admitting: Family Medicine

## 2020-09-27 ENCOUNTER — Ambulatory Visit (INDEPENDENT_AMBULATORY_CARE_PROVIDER_SITE_OTHER): Payer: Medicare Other | Admitting: Family Medicine

## 2020-09-27 VITALS — BP 121/72 | Ht 67.0 in | Wt 177.5 lb

## 2020-09-27 DIAGNOSIS — N400 Enlarged prostate without lower urinary tract symptoms: Secondary | ICD-10-CM

## 2020-09-27 DIAGNOSIS — Z23 Encounter for immunization: Secondary | ICD-10-CM

## 2020-09-27 DIAGNOSIS — E1159 Type 2 diabetes mellitus with other circulatory complications: Secondary | ICD-10-CM

## 2020-09-27 DIAGNOSIS — E119 Type 2 diabetes mellitus without complications: Secondary | ICD-10-CM

## 2020-09-27 DIAGNOSIS — E785 Hyperlipidemia, unspecified: Secondary | ICD-10-CM | POA: Diagnosis not present

## 2020-09-27 DIAGNOSIS — I152 Hypertension secondary to endocrine disorders: Secondary | ICD-10-CM | POA: Diagnosis not present

## 2020-09-27 DIAGNOSIS — E1169 Type 2 diabetes mellitus with other specified complication: Secondary | ICD-10-CM

## 2020-09-27 MED ORDER — METFORMIN HCL 500 MG PO TABS: 500.0000 mg | ORAL_TABLET | Freq: Two times a day (BID) | ORAL | 3 refills | Status: DC

## 2020-09-27 MED ORDER — IRBESARTAN 300 MG PO TABS: 300.0000 mg | ORAL_TABLET | Freq: Every day | ORAL | 3 refills | Status: DC

## 2020-09-27 NOTE — Addendum Note (Signed)
Addended by: Alphonzo Dublin on: 09/27/2020 10:20 AM   Modules accepted: Orders

## 2020-09-27 NOTE — Progress Notes (Signed)
BP 121/72   Ht '5\' 7"'  (1.702 m)   Wt 177 lb 8 oz (80.5 kg)   SpO2 99%   BMI 27.80 kg/m    Subjective:   Patient ID: Jeffrey Rubio, male    DOB: 1945-12-26, 75 y.o.   MRN: [id number]  HPI: Jeffrey Rubio is a 75 y.o. male presenting on 09/27/2020 for Medical Management of Chronic Issues, Hyperlipidemia, Diabetes, and Hypertension   HPI Type 2 diabetes mellitus Patient comes in today for recheck of his diabetes. Patient has been currently taking metformin and Ozempic, A1c 7.1. Patient is currently on an ACE inhibitor/ARB. Patient has not seen an ophthalmologist this year. Patient denies any issues with their feet. The symptom started onset as an adult hypertension and hyperlipidemia ARE RELATED TO DM   Hypertension Patient is currently on amlodipine and irbesartan, and their blood pressure today is 121/72. Patient denies any lightheadedness or dizziness. Patient denies headaches, blurred vision, chest pains, shortness of breath, or weakness. Denies any side effects from medication and is content with current medication.   Hyperlipidemia Patient is coming in for recheck of his hyperlipidemia. The patient is currently taking pravastatin. They deny any issues with myalgias or history of liver damage from it. They deny any focal numbness or weakness or chest pain.   BPH Patient is coming in for recheck on BPH Symptoms: None currently, had brother and father with prostate cancer, wants to get checked Medication: None Last PSA: 1 year ago, normal  Relevant past medical, surgical, family and social history reviewed and updated as indicated. Interim medical history since our last visit reviewed. Allergies and medications reviewed and updated.  Review of Systems  Constitutional:  Negative for chills and fever.  Eyes:  Negative for visual disturbance.  Respiratory:  Negative for shortness of breath and wheezing.   Cardiovascular:  Negative for chest pain and leg swelling.   Genitourinary:  Negative for difficulty urinating and frequency.  Musculoskeletal:  Negative for back pain and gait problem.  Skin:  Negative for rash.  Neurological:  Negative for dizziness, weakness and light-headedness.  All other systems reviewed and are negative.  Per HPI unless specifically indicated above   Allergies as of 09/27/2020   No Known Allergies      Medication List        Accurate as of September 27, 2020  9:46 AM. If you have any questions, ask your nurse or doctor.          Accu-Chek Aviva Plus test strip Generic drug: glucose blood Test BS daily Dx E11.69   Accu-Chek Aviva Plus w/Device Kit USE to check blood sugars daily   acetaminophen 500 MG tablet Commonly known as: TYLENOL Take 500 mg by mouth every 6 (six) hours as needed for mild pain or moderate pain.   amLODipine 5 MG tablet Commonly known as: NORVASC Take 1 tablet (5 mg total) by mouth daily.   aspirin 81 MG tablet Take 81 mg by mouth daily.   famotidine 20 MG tablet Commonly known as: PEPCID Take 1 tablet (20 mg total) by mouth 2 (two) times daily.   irbesartan 300 MG tablet Commonly known as: AVAPRO Take 1 tablet (300 mg total) by mouth daily.   magnesium gluconate 500 MG tablet Commonly known as: MAGONATE Take 500 mg by mouth every other day.   meclizine 25 MG tablet Commonly known as: ANTIVERT Take 1 tablet (25 mg total) by mouth 3 (three) times daily as needed for dizziness.  Metamucil 28.3 % Powd Generic drug: Psyllium   metFORMIN 500 MG tablet Commonly known as: GLUCOPHAGE Take 1 tablet (500 mg total) by mouth 2 (two) times daily with a meal.   Ozempic (0.25 or 0.5 MG/DOSE) 2 MG/1.5ML Sopn Generic drug: Semaglutide(0.25 or 0.5MG/DOS) Inject 0.5 mg into the skin once a week.   pravastatin 20 MG tablet Commonly known as: PRAVACHOL Take 1 tablet (20 mg total) by mouth daily.   PROBIOTIC-10 PO Take by mouth daily.   traMADol 50 MG tablet Commonly known as:  ULTRAM Take 1 tablet (50 mg total) by mouth every 12 (twelve) hours as needed.   TRUEplus Lancets 28G Misc Use to check blood sugar once daily and PRN Dx. E11.9   vitamin B-12 1000 MCG tablet Commonly known as: CYANOCOBALAMIN Take 1,000 mcg by mouth every other day.   Vitamin D3 50 MCG (2000 UT) Tabs Take 4,000 Units by mouth every morning.   zinc gluconate 50 MG tablet Take 50 mg by mouth daily.         Objective:   BP 121/72   Ht '5\' 7"'  (1.702 m)   Wt 177 lb 8 oz (80.5 kg)   SpO2 99%   BMI 27.80 kg/m   Wt Readings from Last 3 Encounters:  09/27/20 177 lb 8 oz (80.5 kg)  06/23/20 182 lb (82.6 kg)  03/18/20 176 lb (79.8 kg)    Physical Exam Vitals and nursing note reviewed.  Constitutional:      General: He is not in acute distress.    Appearance: He is well-developed. He is not diaphoretic.  Eyes:     General: No scleral icterus.    Conjunctiva/sclera: Conjunctivae normal.  Neck:     Thyroid: No thyromegaly.  Cardiovascular:     Rate and Rhythm: Normal rate and regular rhythm.     Heart sounds: Normal heart sounds. No murmur heard. Pulmonary:     Effort: Pulmonary effort is normal. No respiratory distress.     Breath sounds: Normal breath sounds. No wheezing.  Musculoskeletal:        General: Normal range of motion.     Cervical back: Neck supple.  Lymphadenopathy:     Cervical: No cervical adenopathy.  Skin:    General: Skin is warm and dry.     Findings: No rash.  Neurological:     Mental Status: He is alert and oriented to person, place, and time.     Coordination: Coordination normal.  Psychiatric:        Behavior: Behavior normal.      Assessment & Plan:   Problem List Items Addressed This Visit       Cardiovascular and Mediastinum   Hypertension associated with diabetes ([place])   Relevant Medications   irbesartan (AVAPRO) 300 MG tablet   metFORMIN (GLUCOPHAGE) 500 MG tablet   Other Relevant Orders   BMP8+EGFR     Endocrine    Hyperlipidemia associated with type 2 diabetes mellitus ([facility])   Relevant Medications   irbesartan (AVAPRO) 300 MG tablet   metFORMIN (GLUCOPHAGE) 500 MG tablet   DM (diabetes mellitus) ([facility]) - Primary   Relevant Medications   irbesartan (AVAPRO) 300 MG tablet   metFORMIN (GLUCOPHAGE) 500 MG tablet   Other Relevant Orders   Bayer DCA Hb A1c Waived   BMP8+EGFR     Genitourinary   Benign prostatic hyperplasia   Relevant Orders   PSA, total and free    Continue current medication, seems to be doing well.  A1c is improved at 7.1.  No medication changes at this point. Follow up plan: Return in about 3 months (around 12/28/2020), or if symptoms worsen or fail to improve, for Diabetes and hypertension and cholesterol recheck.  Counseling provided for all of the vaccine components Orders Placed This Encounter  Procedures   Bayer DCA Hb A1c Waived   PSA, total and free   BMP8+EGFR    Caryl Pina, MD [facility] 09/27/2020, 9:46 AM

## 2020-09-28 LAB — BAYER DCA HB A1C WAIVED: HB A1C (BAYER DCA - WAIVED): 7.1 % — ABNORMAL HIGH (ref <7.0)

## 2020-10-05 ENCOUNTER — Ambulatory Visit (INDEPENDENT_AMBULATORY_CARE_PROVIDER_SITE_OTHER): Payer: Medicare Other | Admitting: Physician Assistant

## 2020-10-05 ENCOUNTER — Encounter: Payer: Self-pay | Admitting: Physician Assistant

## 2020-10-05 ENCOUNTER — Other Ambulatory Visit: Payer: Self-pay

## 2020-10-05 VITALS — BP 112/73 | HR 69 | Temp 97.1°F | Ht 67.0 in | Wt 175.8 lb

## 2020-10-05 DIAGNOSIS — M5440 Lumbago with sciatica, unspecified side: Secondary | ICD-10-CM

## 2020-10-05 NOTE — Progress Notes (Signed)
  Subjective:     Patient ID: Jeffrey Rubio, male   DOB: 1945/07/08, 75 y.o.   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  Genitourinary:  Negative for difficulty urinating.  Musculoskeletal:  Positive for arthralgias, back pain and myalgias. Negative for gait problem.      Objective:   Physical Exam Vitals and nursing note reviewed.  Constitutional:      General: He is not in acute distress.    Appearance: Normal appearance.  Neurological:     Mental Status: He is alert.  Gait nl + TTP of the L lateral L-spine with palp spasm FROM of the L-spine SLR neg Muscle strength distal good and equal bilat Sensory intact No lower ext edema     Assessment:     Back pain    Plan:     Heat/Ice Massage Stretching Unable to due to NSAID'S Since improving will observe for now Nl course reviewed F/U prn

## 2020-10-05 NOTE — Patient Instructions (Signed)
Acute Back Pain, Adult Acute back pain is sudden and usually short-lived. It is often caused by an injury to the muscles and tissues in the back. The injury may result from: A muscle or ligament getting overstretched or torn (strained). Ligaments are tissues that connect bones to each other. Lifting something improperly can cause a back strain. Wear and tear (degeneration) of the spinal disks. Spinal disks are circular tissue that provide cushioning between the bones of the spine (vertebrae). Twisting motions, such as while playing sports or doing yard work. A hit to the back. Arthritis. You may have a physical exam, lab tests, and imaging tests to find the cause ofyour pain. Acute back pain usually goes away with rest and home care. Follow these instructions at home: Managing pain, stiffness, and swelling Treatment may include medicines for pain and inflammation that are taken by mouth or applied to the skin, prescription pain medicine, or muscle relaxants. Take over-the-counter and prescription medicines only as told by your health care provider. Your health care provider may recommend applying ice during the first 24-48 hours after your pain starts. To do this: Put ice in a plastic bag. Place a towel between your skin and the bag. Leave the ice on for 20 minutes, 2-3 times a day. If directed, apply heat to the affected area as often as told by your health care provider. Use the heat source that your health care provider recommends, such as a moist heat pack or a heating pad. Place a towel between your skin and the heat source. Leave the heat on for 20-30 minutes. Remove the heat if your skin turns bright red. This is especially important if you are unable to feel pain, heat, or cold. You have a greater risk of getting burned. Activity  Do not stay in bed. Staying in bed for more than 1-2 days can delay your recovery. Sit up and stand up straight. Avoid leaning forward when you sit or  hunching over when you stand. If you work at a desk, sit close to it so you do not need to lean over. Keep your chin tucked in. Keep your neck drawn back, and keep your elbows bent at a 90-degree angle (right angle). Sit high and close to the steering wheel when you drive. Add lower back (lumbar) support to your car seat, if needed. Take short walks on even surfaces as soon as you are able. Try to increase the length of time you walk each day. Do not sit, drive, or stand in one place for more than 30 minutes at a time. Sitting or standing for long periods of time can put stress on your back. Do not drive or use heavy machinery while taking prescription pain medicine. Use proper lifting techniques. When you bend and lift, use positions that put less stress on your back: Bend your knees. Keep the load close to your body. Avoid twisting. Exercise regularly as told by your health care provider. Exercising helps your back heal faster and helps prevent back injuries by keeping muscles strong and flexible. Work with a physical therapist to make a safe exercise program, as recommended by your health care provider. Do any exercises as told by your physical therapist.  Lifestyle Maintain a healthy weight. Extra weight puts stress on your back and makes it difficult to have good posture. Avoid activities or situations that make you feel anxious or stressed. Stress and anxiety increase muscle tension and can make back pain worse. Learn ways to manage   anxiety and stress, such as through exercise. General instructions Sleep on a firm mattress in a comfortable position. Try lying on your side with your knees slightly bent. If you lie on your back, put a pillow under your knees. Follow your treatment plan as told by your health care provider. This may include: Cognitive or behavioral therapy. Acupuncture or massage therapy. Meditation or yoga. Contact a health care provider if: You have pain that is not  relieved with rest or medicine. You have increasing pain going down into your legs or buttocks. Your pain does not improve after 2 weeks. You have pain at night. You lose weight without trying. You have a fever or chills. Get help right away if: You develop new bowel or bladder control problems. You have unusual weakness or numbness in your arms or legs. You develop nausea or vomiting. You develop abdominal pain. You feel faint. Summary Acute back pain is sudden and usually short-lived. Use proper lifting techniques. When you bend and lift, use positions that put less stress on your back. Take over-the-counter and prescription medicines and apply heat or ice as directed by your health care provider. This information is not intended to replace advice given to you by your health care provider. Make sure you discuss any questions you have with your healthcare provider. Document Revised: 12/09/2019 Document Reviewed: 12/12/2019 Elsevier Patient Education  2022 [employer]  

## 2020-10-13 ENCOUNTER — Encounter: Payer: Self-pay | Admitting: Surgery

## 2020-10-13 ENCOUNTER — Ambulatory Visit (INDEPENDENT_AMBULATORY_CARE_PROVIDER_SITE_OTHER): Payer: Medicare Other | Admitting: Surgery

## 2020-10-13 ENCOUNTER — Ambulatory Visit (INDEPENDENT_AMBULATORY_CARE_PROVIDER_SITE_OTHER): Payer: Medicare Other

## 2020-10-13 ENCOUNTER — Ambulatory Visit: Payer: Self-pay

## 2020-10-13 ENCOUNTER — Other Ambulatory Visit: Payer: Self-pay

## 2020-10-13 ENCOUNTER — Telehealth: Payer: Self-pay | Admitting: Family Medicine

## 2020-10-13 VITALS — BP 133/78 | HR 82 | Ht 67.0 in | Wt 175.0 lb

## 2020-10-13 DIAGNOSIS — M545 Low back pain, unspecified: Secondary | ICD-10-CM

## 2020-10-13 DIAGNOSIS — M4726 Other spondylosis with radiculopathy, lumbar region: Secondary | ICD-10-CM

## 2020-10-13 DIAGNOSIS — M25552 Pain in left hip: Secondary | ICD-10-CM

## 2020-10-13 MED ORDER — METHOCARBAMOL 500 MG PO TABS: 500.0000 mg | ORAL_TABLET | Freq: Three times a day (TID) | ORAL | 0 refills | Status: DC | PRN

## 2020-10-13 MED ORDER — TRAMADOL HCL 50 MG PO TABS: 50.0000 mg | ORAL_TABLET | Freq: Two times a day (BID) | ORAL | 0 refills | Status: DC | PRN

## 2020-10-13 NOTE — Progress Notes (Signed)
Office Visit Note   Patient: Jeffrey Rubio           Date of Birth: 1945/04/08           MRN: [id number] Visit Date: 10/13/2020              Requested by: Dettinger, Fransisca Kaufmann, MD [place],  [place] [id number] PCP: Dettinger, Fransisca Kaufmann, MD   Assessment & Plan: Visit Diagnoses:  1. Pain in left hip   2. Low back pain, unspecified back pain laterality, unspecified chronicity, unspecified whether sciatica present   3. Other spondylosis with radiculopathy, lumbar region     Plan: Today I sent prescriptions in to his pharmacy for Ultram and Robaxin.  Advised patient that with his history of type 2 diabetes and only having 1 kidney that it does make this somewhat challenging with medication management.  Advised that I am not able to give a prednisone taper or recommend him using oral NSAIDs.  I did advise patient and his wife who was present that they can reach out to his primary care physician and see if they would be willing to prescribe the 6-day prednisone taper.  Patient understands at this does have potential to elevate his blood sugars but the PCP may feel more comfortable sending it in for him.  I will also arrange formal therapy.  Patient will follow-up with me in 2 weeks for recheck by advised him to call me in 1 week to let me know how he is feeling.  Next plan would be to get a lumbar MRI.  Follow-Up Instructions: Return in about 2 weeks (around 10/27/2020) for with [facility] recheck lumbar.   Orders:  Orders Placed This Encounter  Procedures   XR Lumbar Spine Complete   XR HIP UNILAT W OR W/O PELVIS 2-3 VIEWS LEFT   No orders of the defined types were placed in this encounter.     Procedures: No procedures performed   Clinical Data: No additional findings.   Subjective: Chief Complaint  Patient presents with   Lower Back - Pain   Left Hip - Pain    HPI 75 year old white male who is new patient to clinic comes in today with complaints of low back pain,  left buttock pain and left thigh pain numbness and tingling.  Patient dates that he has had off-and-on aches and pains in his back for several years but nothing that required him to be seen and treated by physician.  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  States that a couple weeks ago he was on his tractor when he suddenly got jarred in his seat and this caused increased pain in the left lower back.  This pain eventually began to radiate down to the left buttock and left thigh.  This is been more constant but aggravated with activity.  No pain below the knee.  He also complains of constant numbness and tingling in his left thigh.  No right leg symptoms.  No complaints of bowel or bladder issues.  States that he has used Tylenol without any improvement.  He is status post left nephrectomy 1996.  He does not take oral NSAIDs.  Patient also has a history of type 2 diabetes. Review of Systems No current cardiac pulmonary GI GU issues  Objective: Vital Signs: BP 133/78   Pulse 82   Ht 5\' 7"  (1.702 m)   Wt 175 lb (79.4 kg)   BMI 27.41 kg/m  Physical Exam HENT:     Head: Normocephalic and atraumatic.  Eyes:     Extraocular Movements: Extraocular movements intact.  Pulmonary:     Effort: No respiratory distress.  Musculoskeletal:     Cervical back: Normal range of motion.     Comments: Gait is normal.  He has left-sided lumbar paraspinal tenderness.  Positive left-sided notch tenderness.  Pain with lumbar extension.  Lumbar flexion hands to ankles with discomfort.  Negative logroll bilateral hips.  Positive left straight leg raise.  Trace left hip flexion weakness.  Bilateral calves nontender.   Skin:    General: Skin is dry.  Neurological:     Mental Status: He is alert and oriented to person, place, and time.  Psychiatric:        Mood and Affect: Mood normal.    Ortho Exam  Specialty Comments:  No specialty comments available.  Imaging: No results found.   PMFS History: Patient  Active Problem List   Diagnosis Date Noted   Pain management contract signed 09/12/2018   Overweight (BMI 25.0-29.9) 12/07/2017   Dyspnea 01/11/2017   Abnormal cardiovascular stress test 01/11/2017   Urgency incontinence 12/28/2015   Restless leg syndrome 01/01/2015   Benign prostatic hyperplasia    Irritable bowel syndrome 04/15/2013   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   Hypertension associated with diabetes ([place]) 10/22/2012   Hyperlipidemia associated with type 2 diabetes mellitus ([place]) 10/22/2012   DM (diabetes mellitus) ([place]) 10/22/2012   Past Medical History:  Diagnosis Date   Asthmatic bronchitis    BPH (benign prostatic hypertrophy)    Cataracts, bilateral    Colon polyps    Diabetes mellitus    Diverticulosis    Fibromyalgia    Gout    History of nephrectomy, unilateral    left    Hyperlipidemia    Hypertension    Single kidney     Family History  Problem Relation Age of Onset   Coronary artery disease Other        family history    Hypertension Other        family history    Alzheimer's disease Mother 58   Cancer Father        prostate   Stroke Father    CAD Father 45       CABG   Hypertension Sister    Hypercalcemia Brother    Hypertension Brother    Kidney Stones Son    Breast cancer Maternal Grandmother    Diabetes Paternal Grandmother    Other Paternal Grandmother        Crite's disease   Suicidality Paternal Grandfather     Past Surgical History:  Procedure Laterality Date   APPENDECTOMY     CHOLECYSTECTOMY     COLONOSCOPY W/ POLYPECTOMY     EYE SURGERY     catarat - right    LEFT HEART CATH AND CORONARY ANGIOGRAPHY N/A 01/11/2017   Procedure: LEFT HEART CATH AND CORONARY ANGIOGRAPHY;  Surgeon: Martinique, Peter M, MD;  Location: [facility];  Service: Cardiovascular;  Laterality: N/A;   NEPHRECTOMY  1996   left , secondary to nephrolithiasis    Social History   Occupational History   Not on file  Tobacco Use   Smoking status:  Former    Packs/day: 1.00    Years: 8.00    Pack years: 8.00    Types: Cigarettes    Quit date: 04/03/1970    Years since quitting: 50.5   Smokeless  tobacco: Never  Vaping Use   Vaping Use: Never used  Substance and Sexual Activity   Alcohol use: No   Drug use: No   Sexual activity: Not on file

## 2020-10-13 NOTE — Telephone Encounter (Signed)
Pts wife called stating that pt just had a visit with the doctor at [facility] and told pt to call Dr Dettinger and see if he would approve and call him in Rx for Prednisone because he didn't feel comfortable doing so since pt is diabetic and he doesn't know how bad his diabetes are.  Please advise and call patient/wife [phone]

## 2020-10-13 NOTE — Telephone Encounter (Signed)
Pt needs to be evaluated before Prednisone can be prescribed. Appt scheduled 10/15/20.

## 2020-10-14 MED ORDER — TRAMADOL HCL 50 MG PO TABS: 50.0000 mg | ORAL_TABLET | Freq: Two times a day (BID) | ORAL | 0 refills | Status: DC | PRN

## 2020-10-14 NOTE — Addendum Note (Signed)
Addended by: Lanae Crumbly on: 10/14/2020 03:33 PM   Modules accepted: Orders

## 2020-10-15 ENCOUNTER — Ambulatory Visit (INDEPENDENT_AMBULATORY_CARE_PROVIDER_SITE_OTHER): Payer: Medicare Other | Admitting: Family Medicine

## 2020-10-15 ENCOUNTER — Other Ambulatory Visit: Payer: Self-pay

## 2020-10-15 ENCOUNTER — Encounter: Payer: Self-pay | Admitting: Family Medicine

## 2020-10-15 VITALS — BP 135/80 | HR 65 | Ht 67.0 in | Wt 177.0 lb

## 2020-10-15 DIAGNOSIS — M5440 Lumbago with sciatica, unspecified side: Secondary | ICD-10-CM

## 2020-10-15 DIAGNOSIS — M545 Low back pain, unspecified: Secondary | ICD-10-CM | POA: Diagnosis not present

## 2020-10-15 DIAGNOSIS — Z0289 Encounter for other administrative examinations: Secondary | ICD-10-CM

## 2020-10-15 DIAGNOSIS — G8929 Other chronic pain: Secondary | ICD-10-CM

## 2020-10-15 DIAGNOSIS — Z79891 Long term (current) use of opiate analgesic: Secondary | ICD-10-CM | POA: Diagnosis not present

## 2020-10-15 MED ORDER — METHYLPREDNISOLONE 4 MG PO TBPK: ORAL_TABLET | ORAL | 0 refills | Status: DC

## 2020-10-15 MED ORDER — TRAMADOL HCL 50 MG PO TABS: 50.0000 mg | ORAL_TABLET | Freq: Two times a day (BID) | ORAL | 1 refills | Status: DC | PRN

## 2020-10-15 NOTE — Progress Notes (Signed)
BP 135/80   Pulse 65   Ht 5\' 7"  (1.702 m)   Wt 177 lb (80.3 kg)   SpO2 96%   BMI 27.72 kg/m    Subjective:   Patient ID: Jeffrey Rubio, male    DOB: 07-Apr-1945, 75 y.o.   MRN: [id number]  HPI: Jeffrey Rubio is a 75 y.o. male presenting on 10/15/2020 for Back Pain (Lower, seen recently at ortho. Had xray, PT ordered, Muscle relaxer. Dr. Rosanna Randy suggestions on Prednisone for pt)   HPI Back pain Patient is coming in for back pain.  He has been dealing with some back pain previously but recently he did something where he really jarred his back and was lifting some things and has been bothering him and not going away like he usually does for him.  Patient says he does get some radiation down his left leg but has not been as severe as it was initially.  Relevant past medical, surgical, family and social history reviewed and updated as indicated. Interim medical history since our last visit reviewed. Allergies and medications reviewed and updated.  Review of Systems  Constitutional:  Negative for chills and fever.  Respiratory:  Negative for shortness of breath and wheezing.   Cardiovascular:  Negative for chest pain and leg swelling.  Musculoskeletal:  Positive for back pain. Negative for gait problem.  Skin:  Negative for rash.  Neurological:  Negative for numbness.  All other systems reviewed and are negative.  Per HPI unless specifically indicated above       Objective:   BP 135/80   Pulse 65   Ht 5\' 7"  (1.702 m)   Wt 177 lb (80.3 kg)   SpO2 96%   BMI 27.72 kg/m   Wt Readings from Last 3 Encounters:  10/15/20 177 lb (80.3 kg)  10/13/20 175 lb (79.4 kg)  10/05/20 175 lb 12.8 oz (79.7 kg)    Physical Exam Vitals and nursing note reviewed.  Constitutional:      Appearance: Normal appearance.  Musculoskeletal:     Lumbar back: No swelling, deformity, spasms, tenderness or bony tenderness. Negative right straight leg raise test and negative left straight  leg raise test.  Neurological:     Mental Status: He is alert.      Assessment & Plan:   Problem List Items Addressed This Visit       Other   Pain management contract signed   Relevant Medications   traMADol (ULTRAM) 50 MG tablet   Other Visit Diagnoses     Chronic bilateral low back pain without sciatica    -  Primary   Relevant Medications   traMADol (ULTRAM) 50 MG tablet   methylPREDNISolone (MEDROL DOSEPAK) 4 MG TBPK tablet   Acute bilateral low back pain with sciatica, sciatica laterality unspecified       Relevant Medications   traMADol (ULTRAM) 50 MG tablet   methylPREDNISolone (MEDROL DOSEPAK) 4 MG TBPK tablet   Other Relevant Orders   ToxASSURE Select 13 (MW), Urine   Long term prescription opiate use       Relevant Medications   traMADol (ULTRAM) 50 MG tablet       He is is actually doing slightly better today.  We will refill his tramadol and give a Medrol Dosepak to help calm it down.  Watch sugars closely. Follow up plan: Return if symptoms worsen or fail to improve.  Counseling provided for all of the vaccine components Orders Placed This Encounter  Procedures  ToxASSURE Select 13 (MW), Urine    Caryl Pina, MD [facility] 10/15/2020, 10:27 AM

## 2020-10-20 LAB — TOXASSURE SELECT 13 (MW), URINE

## 2020-10-21 ENCOUNTER — Telehealth: Payer: Self-pay

## 2020-10-21 DIAGNOSIS — M4726 Other spondylosis with radiculopathy, lumbar region: Secondary | ICD-10-CM

## 2020-10-21 DIAGNOSIS — M545 Low back pain, unspecified: Secondary | ICD-10-CM

## 2020-10-21 NOTE — Telephone Encounter (Signed)
Patients spouse Pamala Hurry called she wanted to let Jeneen Rinks know that the patient was able to get prednisone and tramadol from his pcp, she also his diabetes medication has been doubled, Patients spouse stated he is still having left leg pain but the pain is not severe . Patient is scheduled for a ov Wednesday August 3,2022 at 10:30 am call [phone]

## 2020-10-22 ENCOUNTER — Telehealth: Payer: Self-pay | Admitting: Family Medicine

## 2020-10-22 NOTE — Telephone Encounter (Signed)
  Prescription Request  10/22/2020  What is the name of the medication or equipment? Tramadol  Have you contacted your pharmacy to request a refill? (if applicable) yes  Which pharmacy would you like this sent to? [facility]   Patient notified that their request is being sent to the clinical staff for review and that they should receive a response within 2 business days.    Dettinger's pt

## 2020-10-22 NOTE — Addendum Note (Signed)
Addended by: Meyer Cory on: 10/22/2020 01:58 PM   Modules accepted: Orders

## 2020-10-22 NOTE — Telephone Encounter (Signed)
I called patient's wife and advised. They prefer [facility] for MRI and follow up with Dr. Lorin Mercy in the [place] office for review.  Sabrina--patient wants to go to [facility]. Thanks.

## 2020-10-25 NOTE — Telephone Encounter (Signed)
Order was just sent to [facility], they will contact pt to schedule appt

## 2020-10-25 NOTE — Telephone Encounter (Signed)
[facility] because of insurance regulation[facility]  [facility] and ask [facility] full [facility]  Spoke with Davena, she filled prescription.  Patient's wife aware.

## 2020-10-27 ENCOUNTER — Telehealth: Payer: Self-pay

## 2020-10-27 NOTE — Telephone Encounter (Signed)
Can you advise? Jeffrey Rubio had originally ordered PT, but now has MRI ordered for patient and has him coming back to review with you. Would you like for him to hold on PT until after MRI review?

## 2020-10-27 NOTE — Telephone Encounter (Signed)
Pt would like to know if he needs to cancel the upcoming PT eval

## 2020-10-28 NOTE — Telephone Encounter (Signed)
I called patient and advised. He would like to hold off on PT until he is seen back post MRI.  Could you please cancel patient's PT appt for tomorrow?

## 2020-10-29 ENCOUNTER — Ambulatory Visit: Payer: Medicare Other | Admitting: Rehabilitative and Restorative Service Providers"

## 2020-11-02 DIAGNOSIS — M47816 Spondylosis without myelopathy or radiculopathy, lumbar region: Secondary | ICD-10-CM | POA: Diagnosis not present

## 2020-11-02 DIAGNOSIS — M545 Low back pain, unspecified: Secondary | ICD-10-CM | POA: Diagnosis not present

## 2020-11-03 ENCOUNTER — Ambulatory Visit: Payer: Medicare Other | Admitting: Surgery

## 2020-11-04 ENCOUNTER — Ambulatory Visit (INDEPENDENT_AMBULATORY_CARE_PROVIDER_SITE_OTHER): Payer: Medicare Other | Admitting: Orthopaedic Surgery

## 2020-11-04 ENCOUNTER — Other Ambulatory Visit: Payer: Self-pay

## 2020-11-04 ENCOUNTER — Encounter: Payer: Self-pay | Admitting: Orthopaedic Surgery

## 2020-11-04 DIAGNOSIS — M5126 Other intervertebral disc displacement, lumbar region: Secondary | ICD-10-CM

## 2020-11-04 DIAGNOSIS — M5116 Intervertebral disc disorders with radiculopathy, lumbar region: Secondary | ICD-10-CM | POA: Insufficient documentation

## 2020-11-04 NOTE — Progress Notes (Signed)
Office Visit Note   Patient: Jeffrey Rubio           Date of Birth: [birth year]           MRN: [id number] Visit Date: 11/04/2020              Requested by: Dettinger, Fransisca Kaufmann, MD [place],  [place] [address] PCP: Dettinger, Fransisca Kaufmann, MD   Assessment & Plan: Visit Diagnoses:  1. Protrusion of lumbar intervertebral disc     Plan: We reviewed the MRI scan with patient and his wife.  Pathophysiology discussed far lateral disc protrusion foraminal and extraforaminal on the left at L2-3.  Currently symptoms of settle down.  If he gets recurrent symptoms severe enough will consider epidural injection.  Currently is back to normal normal activities and he can follow-up with me on an as-needed basis.  Pathophysiology discussed extensively I gave him a copy of the report.  Follow-Up Instructions: No follow-ups on file.   Orders:  No orders of the defined types were placed in this encounter.  No orders of the defined types were placed in this encounter.     Procedures: No procedures performed   Clinical Data: No additional findings.   Subjective: Chief Complaint  Patient presents with   Lower Back - Pain, Follow-up    MRI lumbar review   Left Hip - Pain    HPI 75 year old male with a long history of some intermittent episodic occasional back pain symptoms.  Had an episode when his riding his tractor hit a bump hard twice despite his spring the seat felt like it bottomed out and he had significant increased pain a couple days later.  He states he was laid up for several weeks but now has been able to be up walking again without severe back spasms.  Patient had an MRI scan Which showed far lateral disc protrusion on the left at L2-3 consistent with the symptoms.  No bowel bladder symptoms.  He states he is moving much better and probably 90% of his symptoms have resolved.  He states this was much worse than other episodes he has had.  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 Review of Systems all systems noncontributory to HPI 14 point system update.   Objective: Vital Signs: Ht '5\' 7"'$  (1.702 m)   Wt 175 lb (79.4 kg)   BMI 27.41 kg/m   Physical Exam Constitutional:      Appearance: He is well-developed.  HENT:     Head: Normocephalic and atraumatic.     Right Ear: External ear normal.     Left Ear: External ear normal.  Eyes:     Pupils: Pupils are equal, round, and reactive to light.  Neck:     Thyroid: No thyromegaly.     Trachea: No tracheal deviation.  Cardiovascular:     Rate and Rhythm: Normal rate.  Pulmonary:     Effort: Pulmonary effort is normal.     Breath sounds: No wheezing.  Abdominal:     General: Bowel sounds are normal.     Palpations: Abdomen is soft.  Musculoskeletal:     Cervical back: Neck supple.  Skin:    General: Skin is warm and dry.     Capillary Refill: Capillary refill takes less than 2 seconds.  Neurological:     Mental Status: He is alert and oriented to person, place, and time.  Psychiatric:        Behavior: Behavior normal.        Thought Content: Thought content normal.        Judgment: Judgment normal.    Ortho Exam patient is sitting standing easily good quad strength negative logroll hips no trochanteric bursal tenderness.  No rash over exposed skin.  Specialty Comments:  No specialty comments available.  Imaging: Impression  Degenerative changes of the lumbar spine, more pronounced at L2-3  where a left central/foraminal disc protrusion contributes for  narrowing of the left subarticular zone and moderate left neural  foraminal narrowing. There is also mild right neural foraminal  narrowing at this level.    Electronically Signed    By: Pedro Earls M.D.    On: 11/03/2020 14:35   PMFS History: Patient Active Problem List   Diagnosis Date Noted   Protrusion of lumbar intervertebral disc 11/04/2020   Pain  management contract signed 09/12/2018   Overweight (BMI 25.0-29.9) 12/07/2017   Dyspnea 01/11/2017   Abnormal cardiovascular stress test 01/11/2017   Urgency incontinence 12/28/2015   Restless leg syndrome 01/01/2015   Benign prostatic hyperplasia    Irritable bowel syndrome 04/15/2013   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   Hypertension associated with diabetes ([place]) 10/22/2012   Hyperlipidemia associated with type 2 diabetes mellitus ([place]) 10/22/2012   DM (diabetes mellitus) ([place]) 10/22/2012   Past Medical History:  Diagnosis Date   Asthmatic bronchitis    BPH (benign prostatic hypertrophy)    Cataracts, bilateral    Colon polyps    Diabetes mellitus    Diverticulosis    Fibromyalgia    Gout    History of nephrectomy, unilateral    left    Hyperlipidemia    Hypertension    Single kidney     Family History  Problem Relation Age of Onset   Coronary artery disease Other        family history    Hypertension Other        family history    Alzheimer's disease Mother 70   Cancer Father        prostate   Stroke Father    CAD Father 60       CABG   Hypertension Sister    Hypercalcemia Brother    Hypertension Brother    Kidney Stones Son    Breast cancer Maternal Grandmother    Diabetes Paternal Grandmother    Other Paternal Grandmother        Crite's disease   Suicidality Paternal Grandfather     Past Surgical History:  Procedure Laterality Date   APPENDECTOMY     CHOLECYSTECTOMY     COLONOSCOPY W/ POLYPECTOMY     EYE SURGERY     catarat - right    LEFT HEART CATH AND CORONARY ANGIOGRAPHY N/A 01/11/2017   Procedure: LEFT HEART CATH AND CORONARY ANGIOGRAPHY;  Surgeon: Martinique, Peter M, MD;  Location: [facility];  Service: Cardiovascular;  Laterality: N/A;   NEPHRECTOMY  1996   left , secondary to nephrolithiasis    Social History   Occupational History   Not on file  Tobacco Use   Smoking status: Former    Packs/day: 1.00    Years: 8.00    Pack  years: 8.00    Types: Cigarettes    Quit date: 04/03/1970    Years since quitting: 50.6   Smokeless tobacco: Never  Vaping Use   Vaping Use: Never used  Substance  and Sexual Activity   Alcohol use: No   Drug use: No   Sexual activity: Not on file

## 2020-11-15 ENCOUNTER — Telehealth: Payer: Self-pay | Admitting: *Deleted

## 2020-11-15 NOTE — Telephone Encounter (Signed)
Pt assistance [employer] #5 boxes here for pt = in fridge with name and dob on it - pt aware to pick up .

## 2020-12-29 ENCOUNTER — Ambulatory Visit (INDEPENDENT_AMBULATORY_CARE_PROVIDER_SITE_OTHER): Payer: Medicare Other | Admitting: Family Medicine

## 2020-12-29 ENCOUNTER — Encounter: Payer: Self-pay | Admitting: Family Medicine

## 2020-12-29 ENCOUNTER — Other Ambulatory Visit: Payer: Self-pay

## 2020-12-29 VITALS — BP 127/72 | HR 66 | Ht 67.0 in | Wt 182.0 lb

## 2020-12-29 DIAGNOSIS — E785 Hyperlipidemia, unspecified: Secondary | ICD-10-CM

## 2020-12-29 DIAGNOSIS — E1169 Type 2 diabetes mellitus with other specified complication: Secondary | ICD-10-CM

## 2020-12-29 DIAGNOSIS — I152 Hypertension secondary to endocrine disorders: Secondary | ICD-10-CM | POA: Diagnosis not present

## 2020-12-29 DIAGNOSIS — E119 Type 2 diabetes mellitus without complications: Secondary | ICD-10-CM

## 2020-12-29 DIAGNOSIS — E1159 Type 2 diabetes mellitus with other circulatory complications: Secondary | ICD-10-CM | POA: Diagnosis not present

## 2020-12-29 DIAGNOSIS — Z23 Encounter for immunization: Secondary | ICD-10-CM

## 2020-12-29 LAB — BAYER DCA HB A1C WAIVED: HB A1C (BAYER DCA - WAIVED): 7 % — ABNORMAL HIGH (ref 4.8–5.6)

## 2020-12-29 NOTE — Progress Notes (Signed)
BP 127/72   Pulse 66   Ht _0  (1.702 m)   Wt 182 lb (82.6 kg)   SpO2 98%   BMI 28.51 kg/m    Subjective:   Patient ID: Jeffrey Rubio, male    DOB: 10-11-1945, 75 y.o.   MRN: [id number]  HPI: Jeffrey Rubio is a 75 y.o. male presenting on 12/29/2020 for Medical Management of Chronic Issues, Diabetes, and Back Pain   HPI Type 2 diabetes mellitus Patient comes in today for recheck of his diabetes. Patient has been currently taking Ozempic and metformin. Patient is currently on an ACE inhibitor/ARB. Patient has seen an ophthalmologist this year. Patient denies any issues with their feet. The symptom started onset as an adult hypertension and hyperlipidemia ARE RELATED TO DM   Hypertension Patient is currently on irbesartan and amlodipine, and their blood pressure today is 127/72. Patient denies any lightheadedness or dizziness. Patient denies headaches, blurred vision, chest pains, shortness of breath, or weakness. Denies any side effects from medication and is content with current medication.   Hyperlipidemia Patient is coming in for recheck of his hyperlipidemia. The patient is currently taking pravastatin. They deny any issues with myalgias or history of liver damage from it. They deny any focal numbness or weakness or chest pain.   Back pain Patient saw his orthopedic and they did some imaging and did find that he was having some bone spurs that are impinging on nerves and are hoping to do that surgery in the future when he feels necessary.  He feels like Tylenol does better than tramadol so he just wants to continue forward with Tylenol and he will manage the flares as they come.  He also said they want to do an injection next time he has a flare to see if it helps.  Relevant past medical, surgical, family and social history reviewed and updated as indicated. Interim medical history since our last visit reviewed. Allergies and medications reviewed and updated.  Review of  Systems  Constitutional:  Negative for chills and fever.  Eyes:  Negative for visual disturbance.  Respiratory:  Negative for shortness of breath and wheezing.   Cardiovascular:  Negative for chest pain and leg swelling.  Musculoskeletal:  Positive for back pain. Negative for gait problem.  Skin:  Negative for rash.  Neurological:  Negative for dizziness and light-headedness.  All other systems reviewed and are negative.  Per HPI unless specifically indicated above   Allergies as of 12/29/2020   No Known Allergies      Medication List        Accurate as of December 29, 2020  9:30 AM. If you have any questions, ask your nurse or doctor.          STOP taking these medications    methylPREDNISolone 4 MG Tbpk tablet Commonly known as: MEDROL DOSEPAK Stopped by: Fransisca Kaufmann Aundre Hietala, MD       TAKE these medications    Accu-Chek Aviva Plus test strip Generic drug: glucose blood Test BS daily Dx E11.69   Accu-Chek Aviva Plus w/Device Kit USE to check blood sugars daily   acetaminophen 500 MG tablet Commonly known as: TYLENOL Take 500 mg by mouth every 6 (six) hours as needed for mild pain or moderate pain.   amLODipine 5 MG tablet Commonly known as: NORVASC Take 1 tablet (5 mg total) by mouth daily.   aspirin 81 MG tablet Take 81 mg by mouth daily.   famotidine 20 MG  tablet Commonly known as: PEPCID Take 1 tablet (20 mg total) by mouth 2 (two) times daily.   irbesartan 300 MG tablet Commonly known as: AVAPRO Take 1 tablet (300 mg total) by mouth daily.   magnesium gluconate 500 MG tablet Commonly known as: MAGONATE Take 500 mg by mouth every other day.   meclizine 25 MG tablet Commonly known as: ANTIVERT Take 1 tablet (25 mg total) by mouth 3 (three) times daily as needed for dizziness.   Metamucil 28.3 % Powd Generic drug: Psyllium   metFORMIN 500 MG tablet Commonly known as: GLUCOPHAGE Take 1 tablet (500 mg total) by mouth 2 (two) times daily  with a meal.   methocarbamol 500 MG tablet Commonly known as: ROBAXIN Take 1 tablet (500 mg total) by mouth every 8 (eight) hours as needed for muscle spasms.   Ozempic (0.25 or 0.5 MG/DOSE) 2 MG/1.5ML Sopn Generic drug: Semaglutide(0.25 or 0.5MG/DOS) Inject 0.5 mg into the skin once a week.   pravastatin 20 MG tablet Commonly known as: PRAVACHOL Take 1 tablet (20 mg total) by mouth daily.   PROBIOTIC-10 PO Take by mouth daily.   traMADol 50 MG tablet Commonly known as: ULTRAM Take 1 tablet (50 mg total) by mouth every 12 (twelve) hours as needed.   TRUEplus Lancets 28G Misc Use to check blood sugar once daily and PRN Dx. E11.9   vitamin B-12 1000 MCG tablet Commonly known as: CYANOCOBALAMIN Take 1,000 mcg by mouth every other day.   Vitamin D3 50 MCG (2000 UT) Tabs Take 4,000 Units by mouth every morning.   zinc gluconate 50 MG tablet Take 50 mg by mouth daily.         Objective:   BP 127/72   Pulse 66   Ht _0  (1.702 m)   Wt 182 lb (82.6 kg)   SpO2 98%   BMI 28.51 kg/m   Wt Readings from Last 3 Encounters:  12/29/20 182 lb (82.6 kg)  11/04/20 175 lb (79.4 kg)  10/15/20 177 lb (80.3 kg)    Physical Exam Vitals and nursing note reviewed.  Constitutional:      General: He is not in acute distress.    Appearance: He is well-developed. He is not diaphoretic.  Eyes:     General: No scleral icterus.    Conjunctiva/sclera: Conjunctivae normal.  Neck:     Thyroid: No thyromegaly.  Cardiovascular:     Rate and Rhythm: Normal rate and regular rhythm.     Heart sounds: Normal heart sounds. No murmur heard. Pulmonary:     Effort: Pulmonary effort is normal. No respiratory distress.     Breath sounds: Normal breath sounds. No wheezing.  Musculoskeletal:        General: Normal range of motion.     Cervical back: Neck supple.  Lymphadenopathy:     Cervical: No cervical adenopathy.  Skin:    General: Skin is warm and dry.     Findings: No rash.   Neurological:     Mental Status: He is alert and oriented to person, place, and time.     Coordination: Coordination normal.  Psychiatric:        Behavior: Behavior normal.    Results for orders placed or performed in visit on 10/15/20  ToxASSURE Select 77 (MW), Urine  Result Value Ref Range   Summary Note     Assessment & Plan:   Problem List Items Addressed This Visit       Cardiovascular and Mediastinum  Hypertension associated with diabetes ([place])     Endocrine   Hyperlipidemia associated with type 2 diabetes mellitus ([place])   DM (diabetes mellitus) ([place]) - Primary   Relevant Orders   Bayer DCA Hb A1c Waived    Continue current medicine.  No change.  A1c looks good at 7.0.  Patient says tramadol is not helping so he wants to just continue with Tylenol and will do injections with his orthopedic when it flares at next. Follow up plan: Return if symptoms worsen or fail to improve, for Diabetes and hypertension and cholesterol.  Counseling provided for all of the vaccine components Orders Placed This Encounter  Procedures   Bayer [place] Hb A1c [place] Nairi Oswald, MD [facility] 12/29/2020, 9:30 AM

## 2021-02-02 ENCOUNTER — Telehealth: Payer: Self-pay | Admitting: Radiology

## 2021-02-02 DIAGNOSIS — M5126 Other intervertebral disc displacement, lumbar region: Secondary | ICD-10-CM

## 2021-02-02 NOTE — Telephone Encounter (Signed)
Patient's wife called [place] office and states that patient is ready to get injection in back. Per last office note, you would consider lumbar epidural steroid injection if symptoms are severe enough.   Would you like for me to enter referral for Dr. Ernestina Patches? Please advise.  Home Ph #: [phone]  Cell Ph #:  [phone]

## 2021-02-03 NOTE — Telephone Encounter (Signed)
Referral entered  

## 2021-02-03 NOTE — Addendum Note (Signed)
Addended by: Meyer Cory on: 02/03/2021 01:38 PM   Modules accepted: Orders

## 2021-03-07 ENCOUNTER — Ambulatory Visit (INDEPENDENT_AMBULATORY_CARE_PROVIDER_SITE_OTHER): Payer: Medicare Other | Admitting: Physical Medicine and Rehabilitation

## 2021-03-07 ENCOUNTER — Other Ambulatory Visit: Payer: Self-pay

## 2021-03-07 ENCOUNTER — Encounter: Payer: Self-pay | Admitting: Physical Medicine and Rehabilitation

## 2021-03-07 ENCOUNTER — Ambulatory Visit: Payer: Medicare Other

## 2021-03-07 VITALS — BP 157/81 | HR 62 | Ht 67.0 in | Wt 175.0 lb

## 2021-03-07 DIAGNOSIS — M5416 Radiculopathy, lumbar region: Secondary | ICD-10-CM

## 2021-03-07 MED ORDER — DEXAMETHASONE SODIUM PHOSPHATE 10 MG/ML IJ SOLN: 15.0000 mg | Freq: Once | INTRAMUSCULAR | Status: DC

## 2021-03-07 NOTE — Progress Notes (Signed)
Jeffrey Rubio - 75 y.o. male MRN [id number]  Date of birth: 18-Jan-1946  Office Visit Note: Visit Date: 03/07/2021 PCP: Dettinger, Fransisca Kaufmann, MD Referred by: Dettinger, Fransisca Kaufmann, MD  Subjective: Chief Complaint  Patient presents with   Lower Back - Pain   HPI:  Jeffrey Rubio is a 75 y.o. male who comes in today at the request of Dr. Rodell Perna for planned Left L3-4 Lumbar Transforaminal epidural steroid injection with fluoroscopic guidance.  The patient has failed conservative care including home exercise, medications, time and activity modification.  This injection will be diagnostic and hopefully therapeutic.  Please see requesting physician notes for further details and justification.  He can get pain that will flip-flop from side to side but more predominantly left-sided.  He is actually been doing better recently compared to the first initial episode of the pain.    ROS Otherwise per HPI.  Assessment & Plan: Visit Diagnoses:    ICD-10-CM   1. Lumbar radiculopathy  M54.16 XR C-ARM NO REPORT    Epidural Steroid injection    dexamethasone (DECADRON) injection 15 mg      Plan: No additional findings.   Meds & Orders:  Meds ordered this encounter  Medications   dexamethasone (DECADRON) injection 15 mg    Orders Placed This Encounter  Procedures   XR C-ARM NO REPORT   Epidural Steroid injection    Follow-up: No follow-ups on file.   Procedures: No procedures performed  Lumbosacral Transforaminal Epidural Steroid Injection - Sub-Pedicular Approach with Fluoroscopic Guidance  Patient: Jeffrey Rubio      Date of Birth: 01/22/1946 MRN: [id number] PCP: Dettinger, Fransisca Kaufmann, MD      Visit Date: 03/07/2021   Universal Protocol:    Date/Time: 03/07/2021  Consent Given By: the patient  Position: PRONE  Additional Comments: Vital signs were monitored before and after the procedure. Patient was prepped and draped in the usual sterile fashion. The correct  patient, procedure, and site was verified.   Injection Procedure Details:   Procedure diagnoses: Lumbar radiculopathy [M54.16]    Meds Administered:  Meds ordered this encounter  Medications   dexamethasone (DECADRON) injection 15 mg    Laterality: Left  Location/Site: L3  Needle:5.0 in., 22 ga.  Short bevel or Quincke spinal needle  Needle Placement: Transforaminal  Findings:    -Comments: Excellent flow of contrast along the nerve, nerve root and into the epidural space.  Procedure Details: After squaring off the end-plates to get a true AP view, the C-arm was positioned so that an oblique view of the foramen as noted above was visualized. The target area is just inferior to the "nose of the scotty dog" or sub pedicular. The soft tissues overlying this structure were infiltrated with 2-3 ml. of 1% Lidocaine without Epinephrine.  The spinal needle was inserted toward the target using a "trajectory" view along the fluoroscope beam.  Under AP and lateral visualization, the needle was advanced so it did not puncture dura and was located close the 6 O'Clock position of the pedical in AP tracterory. Biplanar projections were used to confirm position. Aspiration was confirmed to be negative for CSF and/or blood. A 1-2 ml. volume of Isovue-250 was injected and flow of contrast was noted at each level. Radiographs were obtained for documentation purposes.   After attaining the desired flow of contrast documented above, a 0.5 to 1.0 ml test dose of 0.25% Marcaine was injected into each respective transforaminal space.  The patient  was observed for 90 seconds post injection.  After no sensory deficits were reported, and normal lower extremity motor function was noted,   the above injectate was administered so that equal amounts of the injectate were placed at each foramen (level) into the transforaminal epidural space.   Additional Comments:  No complications occurred Dressing: 2 x 2  sterile gauze and Band-Aid    Post-procedure details: Patient was observed during the procedure. Post-procedure instructions were reviewed.  Patient left the clinic in stable condition.   Clinical History: MRI LUMBAR SPINE WITHOUT CONTRAST   TECHNIQUE:  Multiplanar, multisequence MR imaging of the lumbar spine was  performed. No intravenous contrast was administered.   COMPARISON:  Radiographs October 13, 2020   FINDINGS:  Segmentation:  Standard.   Alignment:  Small retrolisthesis of L2 over L3.   Vertebrae:  No fracture, evidence of discitis, or bone lesion.   Conus medullaris and cauda equina: Conus extends to the L1 level.  Conus and cauda equina appear normal.   Paraspinal and other soft tissues: Mild nodular thickening of the  left adrenal gland, measuring up to 9 mm, similar to prior CT  performed in 2015. Absent left kidney.   Disc levels:   T12-L1: Shallow disc bulge. No spinal canal or neural foraminal  stenosis.   L1-2: Mild facet degenerative changes. No spinal canal or neural  foraminal stenosis.   L2-3: Disc bulge with superimposed left central/foraminal disc  protrusion and mild facet degenerative changes resulting in  narrowing of the left subarticular zone, mild right and moderate  left neural foraminal narrowing.   L3-4: Shallow disc bulge and mild facet degenerative changes without  significant spinal canal or neural foraminal stenosis.   L4-5: Shallow disc bulge and mild facet degenerative changes  resulting in mild bilateral neural foraminal narrowing.   L5-S1: Moderate facet degenerative changes. No spinal canal or  neural foraminal stenosis.   IMPRESSION:  Degenerative changes of the lumbar spine, more pronounced at L2-3  where a left central/foraminal disc protrusion contributes for  narrowing of the left subarticular zone and moderate left neural  foraminal narrowing. There is also mild right neural foraminal  narrowing at this level.     Electronically Signed    By: Pedro Earls M.D.    On: 11/03/2020 14:35     Objective:  VS:  HT:5\' 7"  (170.2 cm)   WT:175 lb (79.4 kg)  BMI:27.4    BP:(!) 157/81  HR:62bpm  TEMP: ( )  RESP:  Physical Exam Vitals and nursing note reviewed.  Constitutional:      General: He is not in acute distress.    Appearance: Normal appearance. He is not ill-appearing.  HENT:     Head: Normocephalic and atraumatic.     Right Ear: External ear normal.     Left Ear: External ear normal.     Nose: No congestion.  Eyes:     Extraocular Movements: Extraocular movements intact.  Cardiovascular:     Rate and Rhythm: Normal rate.     Pulses: Normal pulses.  Pulmonary:     Effort: Pulmonary effort is normal. No respiratory distress.  Abdominal:     General: There is no distension.     Palpations: Abdomen is soft.  Musculoskeletal:        General: No tenderness or signs of injury.     Cervical back: Neck supple.     Right lower leg: No edema.     Left lower leg:  No edema.     Comments: Patient has good distal strength without clonus.  Skin:    Findings: No erythema or rash.  Neurological:     General: No focal deficit present.     Mental Status: He is alert and oriented to person, place, and time.     Sensory: No sensory deficit.     Motor: No weakness or abnormal muscle tone.     Coordination: Coordination normal.  Psychiatric:        Mood and Affect: Mood normal.        Behavior: Behavior normal.     Imaging: No results found.

## 2021-03-07 NOTE — Progress Notes (Signed)
Patient has pain that radiates into both hips, right=left.  The pain changes from side to side. When he has the pain, he has a difficult time walking, getting up and down. The pain usually lasts x 3-4 weeks before it will ease up. He is taking tylenol, naproxen, and tramadol as needed for pain. At times, this does not do any good. He does take a baby aspirin once daily.     Numeric Pain Rating Scale and Functional Assessment Average Pain 9   In the last MONTH (on 0-10 scale) has pain interfered with the following?  1. General activity like being  able to carry out your everyday physical activities such as walking, climbing stairs, carrying groceries, or moving a chair?  Rating(10)   +Driver, Baby Aspirin Daily, -Dye Allergies.

## 2021-03-07 NOTE — Patient Instructions (Signed)

## 2021-03-07 NOTE — Procedures (Signed)
Lumbosacral Transforaminal Epidural Steroid Injection - Sub-Pedicular Approach with Fluoroscopic Guidance  Patient: Jeffrey Rubio      Date of Birth: [birth year] MRN: [id number] PCP: Dettinger, Fransisca Kaufmann, MD      Visit Date: 03/07/2021   Universal Protocol:    Date/Time: 03/07/2021  Consent Given By: the patient  Position: PRONE  Additional Comments: Vital signs were monitored before and after the procedure. Patient was prepped and draped in the usual sterile fashion. The correct patient, procedure, and site was verified.   Injection Procedure Details:   Procedure diagnoses: Lumbar radiculopathy [M54.16]    Meds Administered:  Meds ordered this encounter  Medications   dexamethasone (DECADRON) injection 15 mg    Laterality: Left  Location/Site: L3  Needle:5.0 in., 22 ga.  Short bevel or Quincke spinal needle  Needle Placement: Transforaminal  Findings:    -Comments: Excellent flow of contrast along the nerve, nerve root and into the epidural space.  Procedure Details: After squaring off the end-plates to get a true AP view, the C-arm was positioned so that an oblique view of the foramen as noted above was visualized. The target area is just inferior to the "nose of the scotty dog" or sub pedicular. The soft tissues overlying this structure were infiltrated with 2-3 ml. of 1% Lidocaine without Epinephrine.  The spinal needle was inserted toward the target using a "trajectory" view along the fluoroscope beam.  Under AP and lateral visualization, the needle was advanced so it did not puncture dura and was located close the 6 O'Clock position of the pedical in AP tracterory. Biplanar projections were used to confirm position. Aspiration was confirmed to be negative for CSF and/or blood. A 1-2 ml. volume of Isovue-250 was injected and flow of contrast was noted at each level. Radiographs were obtained for documentation purposes.   After attaining the desired flow of  contrast documented above, a 0.5 to 1.0 ml test dose of 0.25% Marcaine was injected into each respective transforaminal space.  The patient was observed for 90 seconds post injection.  After no sensory deficits were reported, and normal lower extremity motor function was noted,   the above injectate was administered so that equal amounts of the injectate were placed at each foramen (level) into the transforaminal epidural space.   Additional Comments:  No complications occurred Dressing: 2 x 2 sterile gauze and Band-Aid    Post-procedure details: Patient was observed during the procedure. Post-procedure instructions were reviewed.  Patient left the clinic in stable condition.

## 2021-04-06 ENCOUNTER — Ambulatory Visit (INDEPENDENT_AMBULATORY_CARE_PROVIDER_SITE_OTHER): Payer: Medicare Other

## 2021-04-06 VITALS — Ht 67.0 in | Wt 176.0 lb

## 2021-04-06 DIAGNOSIS — Z0000 Encounter for general adult medical examination without abnormal findings: Secondary | ICD-10-CM

## 2021-04-06 NOTE — Patient Instructions (Signed)
[name] , Thank you for taking time to come for your Medicare Wellness Visit. I appreciate your ongoing commitment to your health goals. Please review the following plan we discussed and let me know if I can assist you in the future.   Screening recommendations/referrals: Colonoscopy: done 06/19/2019 - Repeat in 5 years  Recommended yearly ophthalmology/optometry visit for glaucoma screening and checkup Recommended yearly dental visit for hygiene and checkup  Vaccinations: Influenza vaccine: Done 12/29/2020 - Repeat annually  Pneumococcal vaccine: Done 01/28/2013 & 01/01/2015 Tdap vaccine: Done 09/27/2020 - Repeat in 10 years Shingles vaccine: Done 03/21/2017 & 09/06/2017   Covid-19: Done 05/05/2019, 06/02/2019, & 04/28/2020  Advanced directives: Advance directive discussed with you today. Even though you declined this today, please call our office should you change your mind, and we can give you the proper paperwork for you to fill out.   Conditions/risks identified: Aim for 30 minutes of exercise or brisk walking each day, drink 6-8 glasses of water and eat lots of fruits and vegetables.   Next appointment: Follow up in one year for your annual wellness visit.   Preventive Care 76 Years and Older, Male  Preventive care refers to lifestyle choices and visits with your health care provider that can promote health and wellness. What does preventive care include? A yearly physical exam. This is also called an annual well check. Dental exams once or twice a year. Routine eye exams. Ask your health care provider how often you should have your eyes checked. Personal lifestyle choices, including: Daily care of your teeth and gums. Regular physical activity. Eating a healthy diet. Avoiding tobacco and drug use. Limiting alcohol use. Practicing safe sex. Taking low doses of aspirin every day. Taking vitamin and mineral supplements as recommended by your health care provider. What happens during  an annual well check? The services and screenings done by your health care provider during your annual well check will depend on your age, overall health, lifestyle risk factors, and family history of disease. Counseling  Your health care provider may ask you questions about your: Alcohol use. Tobacco use. Drug use. Emotional well-being. Home and relationship well-being. Sexual activity. Eating habits. History of falls. Memory and ability to understand (cognition). Work and work Statistician. Screening  You may have the following tests or measurements: Height, weight, and BMI. Blood pressure. Lipid and cholesterol levels. These may be checked every 5 years, or more frequently if you are over 16 years old. Skin check. Lung cancer screening. You may have this screening every year starting at age 19 if you have a 30-pack-year history of smoking and currently smoke or have quit within the past 15 years. Fecal occult blood test (FOBT) of the stool. You may have this test every year starting at age 46. Flexible sigmoidoscopy or colonoscopy. You may have a sigmoidoscopy every 5 years or a colonoscopy every 10 years starting at age 28. Prostate cancer screening. Recommendations will vary depending on your family history and other risks. Hepatitis C blood test. Hepatitis B blood test. Sexually transmitted disease (STD) testing. Diabetes screening. This is done by checking your blood sugar (glucose) after you have not eaten for a while (fasting). You may have this done every 1-3 years. Abdominal aortic aneurysm (AAA) screening. You may need this if you are a current or former smoker. Osteoporosis. You may be screened starting at age 22 if you are at high risk. Talk with your health care provider about your test results, treatment options, and if  necessary, the need for more tests. Vaccines  Your health care provider may recommend certain vaccines, such as: Influenza vaccine. This is recommended  every year. Tetanus, diphtheria, and acellular pertussis (Tdap, Td) vaccine. You may need a Td booster every 10 years. Zoster vaccine. You may need this after age 11. Pneumococcal 13-valent conjugate (PCV13) vaccine. One dose is recommended after age 49. Pneumococcal polysaccharide (PPSV23) vaccine. One dose is recommended after age 30. Talk to your health care provider about which screenings and vaccines you need and how often you need them. This information is not intended to replace advice given to you by your health care provider. Make sure you discuss any questions you have with your health care provider. Document Released: 04/16/2015 Document Revised: 12/08/2015 Document Reviewed: 01/19/2015 Elsevier Interactive Patient Education  2017 [place] Prevention in the Home Falls can cause injuries. They can happen to people of all ages. There are many things you can do to make your home safe and to help prevent falls. What can I do on the outside of my home? Regularly fix the edges of walkways and driveways and fix any cracks. Remove anything that might make you trip as you walk through a door, such as a raised step or threshold. Trim any bushes or trees on the path to your home. Use bright outdoor lighting. Clear any walking paths of anything that might make someone trip, such as rocks or tools. Regularly check to see if handrails are loose or broken. Make sure that both sides of any steps have handrails. Any raised decks and porches should have guardrails on the edges. Have any leaves, snow, or ice cleared regularly. Use sand or salt on walking paths during winter. Clean up any spills in your garage right away. This includes oil or grease spills. What can I do in the bathroom? Use night lights. Install grab bars by the toilet and in the tub and shower. Do not use towel bars as grab bars. Use non-skid mats or decals in the tub or shower. If you need to sit down in the shower,  use a plastic, non-slip stool. Keep the floor dry. Clean up any water that spills on the floor as soon as it happens. Remove soap buildup in the tub or shower regularly. Attach bath mats securely with double-sided non-slip rug tape. Do not have throw rugs and other things on the floor that can make you trip. What can I do in the bedroom? Use night lights. Make sure that you have a light by your bed that is easy to reach. Do not use any sheets or blankets that are too big for your bed. They should not hang down onto the floor. Have a firm chair that has side arms. You can use this for support while you get dressed. Do not have throw rugs and other things on the floor that can make you trip. What can I do in the kitchen? Clean up any spills right away. Avoid walking on wet floors. Keep items that you use a lot in easy-to-reach places. If you need to reach something above you, use a strong step stool that has a grab bar. Keep electrical cords out of the way. Do not use floor polish or wax that makes floors slippery. If you must use wax, use non-skid floor wax. Do not have throw rugs and other things on the floor that can make you trip. What can I do with my stairs? Do not leave any items  on the stairs. Make sure that there are handrails on both sides of the stairs and use them. Fix handrails that are broken or loose. Make sure that handrails are as long as the stairways. Check any carpeting to make sure that it is firmly attached to the stairs. Fix any carpet that is loose or worn. Avoid having throw rugs at the top or bottom of the stairs. If you do have throw rugs, attach them to the floor with carpet tape. Make sure that you have a light switch at the top of the stairs and the bottom of the stairs. If you do not have them, ask someone to add them for you. What else can I do to help prevent falls? Wear shoes that: Do not have high heels. Have rubber bottoms. Are comfortable and fit you  well. Are closed at the toe. Do not wear sandals. If you use a stepladder: Make sure that it is fully opened. Do not climb a closed stepladder. Make sure that both sides of the stepladder are locked into place. Ask someone to hold it for you, if possible. Clearly mark and make sure that you can see: Any grab bars or handrails. First and last steps. Where the edge of each step is. Use tools that help you move around (mobility aids) if they are needed. These include: Canes. Walkers. Scooters. Crutches. Turn on the lights when you go into a dark area. Replace any light bulbs as soon as they burn out. Set up your furniture so you have a clear path. Avoid moving your furniture around. If any of your floors are uneven, fix them. If there are any pets around you, be aware of where they are. Review your medicines with your doctor. Some medicines can make you feel dizzy. This can increase your chance of falling. Ask your doctor what other things that you can do to help prevent falls. This information is not intended to replace advice given to you by your health care provider. Make sure you discuss any questions you have with your health care provider. Document Released: 01/14/2009 Document Revised: 08/26/2015 Document Reviewed: 04/24/2014 Elsevier Interactive Patient Education  2017 [employer].

## 2021-04-06 NOTE — Progress Notes (Signed)
Subjective:   Jeffrey Rubio is a 76 y.o. male who presents for Medicare Annual/Subsequent preventive examination.  Virtual Visit via Telephone Note  I connected with  [name] on 04/06/21 at 10:30 AM EST by telephone and verified that I am speaking with the correct person using two identifiers.  Location: Patient: Home Provider: [facility] Persons participating in the virtual visit: patient/Nurse Health Advisor   I discussed the limitations, risks, security and privacy concerns of performing an evaluation and management service by telephone and the availability of in person appointments. The patient expressed understanding and agreed to proceed.  Interactive audio and video telecommunications were attempted between this nurse and patient, however failed, due to patient having technical difficulties OR patient did not have access to video capability.  We continued and completed visit with audio only.  Some vital signs may be absent or patient reported.   Khristian Seals E Nyeli Holtmeyer, LPN   Review of Systems     Cardiac Risk Factors include: advanced age (>12mn, >>26women);diabetes mellitus;hypertension;male gender     Objective:    Today's Vitals   04/06/21 1040  Weight: 176 lb (79.8 kg)  Height: _0  (1.702 m)   Body mass index is 27.57 kg/m.  Advanced Directives 04/06/2021 04/05/2020 03/21/2017 01/11/2017 01/10/2017 03/08/2016 01/01/2015  Does Patient Have a Medical Advance Directive? _1  No No  Would patient like information on creating a medical advance directive? No - Patient declined Yes (MAU/Ambulatory/Procedural Areas - Information given) Yes (ED - Information included in AVS) No - Patient declined - Yes (ED - Information included in AVS) No - patient declined information    Current Medications (verified) Outpatient Encounter Medications as of 04/06/2021  Medication Sig   acetaminophen (TYLENOL) 500 MG tablet Take 500 mg by mouth every 6 (six) hours as needed for  mild pain or moderate pain.   amLODipine (NORVASC) 5 MG tablet Take 1 tablet (5 mg total) by mouth daily.   aspirin 81 MG tablet Take 81 mg by mouth daily.   Blood Glucose Monitoring Suppl (ACCU-CHEK AVIVA PLUS) w/Device KIT USE to check blood sugars daily   Cholecalciferol (VITAMIN D3) 2000 units TABS Take 4,000 Units by mouth every morning.   famotidine (PEPCID) 20 MG tablet Take 1 tablet (20 mg total) by mouth 2 (two) times daily.   glucose blood (ACCU-CHEK AVIVA PLUS) test strip Test BS daily Dx E11.69   irbesartan (AVAPRO) 300 MG tablet Take 1 tablet (300 mg total) by mouth daily.   magnesium gluconate (MAGONATE) 500 MG tablet Take 500 mg by mouth every other day.    meclizine (ANTIVERT) 25 MG tablet Take 1 tablet (25 mg total) by mouth 3 (three) times daily as needed for dizziness.   metFORMIN (GLUCOPHAGE) 500 MG tablet Take 1 tablet (500 mg total) by mouth 2 (two) times daily with a meal.   pravastatin (PRAVACHOL) 20 MG tablet Take 1 tablet (20 mg total) by mouth daily.   Probiotic Product (PROBIOTIC-10 PO) Take by mouth daily.   Psyllium (METAMUCIL) 28.3 % POWD    Semaglutide,0.25 or 0.5MG/DOS, (OZEMPIC, 0.25 OR 0.5 MG/DOSE,) 2 MG/1.5ML SOPN Inject 0.5 mg into the skin once a week.   traMADol (ULTRAM) 50 MG tablet Take 1 tablet (50 mg total) by mouth every 12 (twelve) hours as needed.   TRUEplus Lancets 28G MISC Use to check blood sugar once daily and PRN Dx. E11.9   vitamin B-12 (CYANOCOBALAMIN) 1000 MCG tablet Take 1,000 mcg by  mouth every other day.   zinc gluconate 50 MG tablet Take 50 mg by mouth daily.   [DISCONTINUED] methocarbamol (ROBAXIN) 500 MG tablet Take 1 tablet (500 mg total) by mouth every 8 (eight) hours as needed for muscle spasms.   Facility-Administered Encounter Medications as of 04/06/2021  Medication   dexamethasone (DECADRON) injection 15 mg    Allergies (verified) Patient has no known allergies.   History: Past Medical History:  Diagnosis Date    Asthmatic bronchitis    BPH (benign prostatic hypertrophy)    Cataracts, bilateral    Colon polyps    Diabetes mellitus    Diverticulosis    Fibromyalgia    GERD (gastroesophageal reflux disease)    Gout    History of nephrectomy, unilateral    left    Hyperlipidemia    Hypertension    Single kidney    Past Surgical History:  Procedure Laterality Date   APPENDECTOMY     CHOLECYSTECTOMY     COLONOSCOPY W/ POLYPECTOMY     EYE SURGERY     catarat - right    LEFT HEART CATH AND CORONARY ANGIOGRAPHY N/A 01/11/2017   Procedure: LEFT HEART CATH AND CORONARY ANGIOGRAPHY;  Surgeon: Martinique, Peter M, MD;  Location: [facility];  Service: Cardiovascular;  Laterality: N/A;   NEPHRECTOMY  1996   left , secondary to nephrolithiasis    Family History  Problem Relation Age of Onset   Coronary artery disease Other        family history    Hypertension Other        family history    Alzheimer's disease Mother 63   Cancer Father        prostate   Stroke Father    CAD Father 69       CABG   Hypertension Sister    Hypercalcemia Brother    Hypertension Brother    Kidney Stones Son    Breast cancer Maternal Grandmother    Diabetes Paternal Grandmother    Other Paternal Grandmother        Crite's disease   Suicidality Paternal Grandfather    Social History   Socioeconomic History   Marital status: Married    Spouse name: Pamala Hurry   Number of children: 2   Years of education: Not on file   Highest education level: Not on file  Occupational History   Occupation: retired    Comment: owns a farm  Tobacco Use   Smoking status: Former    Packs/day: 1.00    Years: 12.00    Pack years: 12.00    Types: Cigarettes    Quit date: 04/03/1970    Years since quitting: 51.0   Smokeless tobacco: Never  Vaping Use   Vaping Use: Never used  Substance and Sexual Activity   Alcohol use: No   Drug use: No   Sexual activity: Not Currently  Other Topics Concern   Not on file  Social  History Narrative   Lives with wife on a farm.     Social Determinants of Health   Financial Resource Strain: Low Risk    Difficulty of Paying Living Expenses: Not hard at all  Food Insecurity: No Food Insecurity   Worried About Charity fundraiser in the Last Year: Never true   [place] in the Last Year: Never true  Transportation Needs: No Transportation Needs   Lack of Transportation (Medical): No   Lack of Transportation (Non-Medical): No  Physical Activity: Insufficiently Active   Days of Exercise per Week: 5 days   Minutes of Exercise per Session: 20 min  Stress: No Stress Concern Present   Feeling of Stress : Not at all  Social Connections: Socially Integrated   Frequency of Communication with Friends and Family: Three times a week   Frequency of Social Gatherings with Friends and Family: Once a week   Attends Religious Services: More than 4 times per year   Active Member of [employer] or Organizations: Yes   Attends Music therapist: More than 4 times per year   Marital Status: Married    Tobacco Counseling Counseling given: Not Answered   Clinical Intake:  Pre-visit preparation completed: Yes  Pain : No/denies pain     BMI - recorded: 27.57 Nutritional Status: BMI 25 -29 Overweight Nutritional Risks: None Diabetes: Yes CBG done?: No Did pt. bring in CBG monitor from home?: No  How often do you need to have someone help you when you read instructions, pamphlets, or other written materials from your doctor or pharmacy?: 1 - Never  Diabetic? Nutrition Risk Assessment:  Has the patient had any N/V/D within the last 2 months?  No  Does the patient have any non-healing wounds?  No  Has the patient had any unintentional weight loss or weight gain?  No   Diabetes:  Is the patient diabetic?  Yes  If diabetic, was a CBG obtained today?  No  Did the patient bring in their glucometer from home?  No  How often do you monitor your CBG's? 2-3 times  per week.   Financial Strains and Diabetes Management:  Are you having any financial strains with the device, your supplies or your medication? No .  Does the patient want to be seen by Chronic Care Management for management of their diabetes?  No  Would the patient like to be referred to a Nutritionist or for Diabetic Management?  No   Diabetic Exams:  Diabetic Eye Exam: Completed 05/18/2020.   Diabetic Foot Exam: Completed 06/23/2020. Pt has been advised about the importance in completing this exam. Pt is scheduled for diabetic foot exam on next visit.    Interpreter Needed?: No  Information entered by :: Tramayne Sebesta, LPN   Activities of Daily Living In your present state of health, do you have any difficulty performing the following activities: 04/06/2021 04/04/2021  Hearing? Y Y  Comment mild - declines hearing difficulties -  Vision? N N  Difficulty concentrating or making decisions? N N  Walking or climbing stairs? N N  Dressing or bathing? N N  Doing errands, shopping? N N  Preparing Food and eating ? N N  Using the Toilet? N N  In the past six months, have you accidently leaked urine? N N  Do you have problems with loss of bowel control? N N  Managing your Medications? N N  Managing your Finances? N N  Housekeeping or managing your Housekeeping? N N  Some recent data might be hidden    Patient Care Team: Dettinger, Fransisca Kaufmann, MD as PCP - General (Family Medicine) Leroy Sea, MD as Referring Physician (Urology) Minus Breeding, MD as Consulting Physician (Cardiology) Danice Goltz, MD as Consulting Physician (Ophthalmology) Jose Persia, Kathalene Frames, MD as Referring Physician (Gastroenterology) Marybelle Killings, MD as Consulting Physician (Orthopedic Surgery)  Indicate any recent Medical Services you may have received from other than [facility] providers in the past year (date may be approximate).  Assessment:   This is a routine wellness examination for  [place].  Hearing/Vision screen Hearing Screening - Comments:: Mild hearing difficulties - declines hearing aids Vision Screening - Comments:: Wears rx glasses - Up to date with annual eye exams with [facility]  Dietary issues and exercise activities discussed: Current Exercise Habits: Home exercise routine, Type of exercise: walking;Other - see comments, Time (Minutes): 20, Frequency (Times/Week): 5, Weekly Exercise (Minutes/Week): 100, Intensity: Moderate, Exercise limited by: orthopedic condition(s)   Goals Addressed             This Visit's Progress    AWV   On track    04/05/2020 AWV Goal: Fall Prevention  Over the next year, patient will decrease their risk for falls by: Using assistive devices, such as a cane or walker, as needed Identifying fall risks within their home and correcting them by: Removing throw rugs Adding handrails to stairs or ramps Removing clutter and keeping a clear pathway throughout the home Increasing light, especially at night Adding shower handles/bars Raising toilet seat Identifying potential personal risk factors for falls: Medication side effects Incontinence/urgency Vestibular dysfunction Hearing loss Musculoskeletal disorders Neurological disorders Orthostatic hypotension         Depression Screen PHQ 2/9 Scores 04/06/2021 12/29/2020 10/15/2020 10/05/2020 09/27/2020 06/23/2020 04/05/2020  PHQ - 2 Score 0 0 0 0 0 0 0  PHQ- 9 Score - 0 - - - - -    Fall Risk Fall Risk  04/06/2021 04/04/2021 12/29/2020 10/15/2020 10/05/2020  Falls in the past year? 0 0 0 1 1  Number falls in past yr: 0 0 - 1 1  Injury with Fall? 0 0 - 0 0  Risk for fall due to : History of fall(s);Orthopedic patient;Medication side effect - - - History of fall(s)  Follow up Falls prevention discussed - - Falls evaluation completed Falls evaluation completed    FALL RISK PREVENTION PERTAINING TO THE HOME:  Any stairs in or around the home? Yes  If so, are  there any without handrails? No  Home free of loose throw rugs in walkways, pet beds, electrical cords, etc? Yes  Adequate lighting in your home to reduce risk of falls? Yes   ASSISTIVE DEVICES UTILIZED TO PREVENT FALLS:  Life alert? No  Use of a cane, walker or w/c? No  Grab bars in the bathroom? No  Shower chair or bench in shower? No  Elevated toilet seat or a handicapped toilet? No   TIMED UP AND GO:  Was the test performed? No . Telephonic visit  Cognitive Function: MMSE - Mini Mental State Exam 03/21/2017 03/08/2016 01/01/2015  Orientation to time _0 Orientation to Place _1 Registration _2 Attention/ Calculation _3 Recall _4 Language- name 2 objects _5 Language- repeat _6 Language- follow 3 step command _7 Language- read & follow direction _8 Write a sentence _9 Copy design _10 Total score _11 6CIT Screen 04/06/2021 04/05/2020  What Year? 0 points 0 points  What month? 0 points 0 points  What time? 0 points 0 points  Count back from 20 0 points 0 points  Months in reverse 0 points 0 points  Repeat phrase 2 points 4 points  Total Score 2 4    Immunizations Immunization History  Administered Date(s)  Administered   Fluad Quad(high Dose 65+) 02/02/2019, 12/31/2019, 12/29/2020   Influenza, High Dose Seasonal PF 01/15/2017, 03/13/2018   Influenza,inj,Quad PF,6+ Mos 01/28/2013, 01/28/2014, 01/11/2015   Moderna SARS-COV2 Booster Vaccination 04/28/2020   Moderna Sars-Covid-2 Vaccination 05/05/2019, 06/02/2019   Pneumococcal Conjugate-13 01/28/2013   Pneumococcal Polysaccharide-23 01/01/2015   Tdap 09/27/2020   Zoster Recombinat (Shingrix) 03/21/2017, 09/06/2017   Zoster, Live 01/09/2013    TDAP status: Up to date  Flu Vaccine status: Up to date  Pneumococcal vaccine status: Up to date  Covid-19 vaccine status: Completed vaccines  Qualifies for Shingles Vaccine? Yes   Zostavax completed Yes   Shingrix  Completed?: Yes  Screening Tests Health Maintenance  Topic Date Due   COVID-19 Vaccine (3 - Booster for Moderna series) 06/23/2020   OPHTHALMOLOGY EXAM  05/18/2021   FOOT EXAM  06/23/2021   HEMOGLOBIN A1C  06/28/2021   COLONOSCOPY (Pts 45-77yr Insurance coverage will need to be confirmed)  06/18/2024   TETANUS/TDAP  09/28/2030   Pneumonia Vaccine 76 Years old  Completed   INFLUENZA VACCINE  Completed   Hepatitis C Screening  Completed   Zoster Vaccines- Shingrix  Completed   HPV VACCINES  Aged Out    Health Maintenance  Health Maintenance Due  Topic Date Due   COVID-19 Vaccine (3 - Booster for Moderna series) 06/23/2020    Colorectal cancer screening: Type of screening: Colonoscopy. Completed 06/19/2019. Repeat every 5 years  Lung Cancer Screening: (Low Dose CT Chest recommended if Age 76-80years, 30 pack-year currently smoking OR have quit w/in 15years.) does not qualify.   Additional Screening:  Hepatitis C Screening: does qualify; Completed 09/29/2015  Vision Screening: Recommended annual ophthalmology exams for early detection of glaucoma and other disorders of the eye. Is the patient up to date with their annual eye exam?  Yes  Who is the provider or what is the name of the office in which the patient attends annual eye exams? [facility] If pt is not established with a provider, would they like to be referred to a provider to establish care? No .   Dental Screening: Recommended annual dental exams for proper oral hygiene  Community Resource Referral / Chronic Care Management: CRR required this visit?  No   CCM required this visit?  No      Plan:     I have personally reviewed and noted the following in the patients chart:   Medical and social history Use of alcohol, tobacco or illicit drugs  Current medications and supplements including opioid prescriptions. Patient is currently taking opioid prescriptions. Information provided to patient  regarding non-opioid alternatives. Patient advised to discuss non-opioid treatment plan with their provider. Functional ability and status Nutritional status Physical activity Advanced directives List of other physicians Hospitalizations, surgeries, and ER visits in previous 12 months Vitals Screenings to include cognitive, depression, and falls Referrals and appointments  In addition, I have reviewed and discussed with patient certain preventive protocols, quality metrics, and best practice recommendations. A written personalized care plan for preventive services as well as general preventive health recommendations were provided to patient.     ASandrea Hammond LPN   14/08/8097  Nurse Notes: none

## 2021-04-07 ENCOUNTER — Encounter: Payer: Self-pay | Admitting: Family Medicine

## 2021-04-07 ENCOUNTER — Ambulatory Visit (INDEPENDENT_AMBULATORY_CARE_PROVIDER_SITE_OTHER): Payer: Medicare Other | Admitting: Family Medicine

## 2021-04-07 ENCOUNTER — Other Ambulatory Visit: Payer: Self-pay

## 2021-04-07 VITALS — BP 147/73 | HR 58 | Ht 67.0 in | Wt 185.0 lb

## 2021-04-07 DIAGNOSIS — E785 Hyperlipidemia, unspecified: Secondary | ICD-10-CM

## 2021-04-07 DIAGNOSIS — Z23 Encounter for immunization: Secondary | ICD-10-CM

## 2021-04-07 DIAGNOSIS — I152 Hypertension secondary to endocrine disorders: Secondary | ICD-10-CM

## 2021-04-07 DIAGNOSIS — E1169 Type 2 diabetes mellitus with other specified complication: Secondary | ICD-10-CM | POA: Diagnosis not present

## 2021-04-07 DIAGNOSIS — E119 Type 2 diabetes mellitus without complications: Secondary | ICD-10-CM

## 2021-04-07 DIAGNOSIS — E1159 Type 2 diabetes mellitus with other circulatory complications: Secondary | ICD-10-CM | POA: Diagnosis not present

## 2021-04-07 LAB — CMP14+EGFR
ALT: 13 IU/L (ref 0–44)
AST: 15 IU/L (ref 0–40)
Albumin/Globulin Ratio: 1.8 (ref 1.2–2.2)
Albumin: 4.4 g/dL (ref 3.7–4.7)
Alkaline Phosphatase: 93 IU/L (ref 44–121)
BUN/Creatinine Ratio: 14 (ref 10–24)
BUN: 20 mg/dL (ref 8–27)
Bilirubin Total: 0.3 mg/dL (ref 0.0–1.2)
CO2: 23 mmol/L (ref 20–29)
Calcium: 9.5 mg/dL (ref 8.6–10.2)
Chloride: 104 mmol/L (ref 96–106)
Creatinine, Ser: 1.39 mg/dL — ABNORMAL HIGH (ref 0.76–1.27)
Globulin, Total: 2.4 g/dL (ref 1.5–4.5)
Glucose: 170 mg/dL — ABNORMAL HIGH (ref 70–99)
Potassium: 4.9 mmol/L (ref 3.5–5.2)
Sodium: 140 mmol/L (ref 134–144)
Total Protein: 6.8 g/dL (ref 6.0–8.5)
eGFR: 53 mL/min/{1.73_m2} — ABNORMAL LOW (ref >59)

## 2021-04-07 LAB — CBC WITH DIFFERENTIAL/PLATELET
Basophils Absolute: 0 10*3/uL (ref 0.0–0.2)
Basos: 0 %
EOS (ABSOLUTE): 0.1 10*3/uL (ref 0.0–0.4)
Eos: 1 %
Hematocrit: 41.8 % (ref 37.5–51.0)
Hemoglobin: 14.1 g/dL (ref 13.0–17.7)
Immature Grans (Abs): 0 10*3/uL (ref 0.0–0.1)
Immature Granulocytes: 1 %
Lymphocytes Absolute: 2.6 10*3/uL (ref 0.7–3.1)
Lymphs: 38 %
MCH: 30.7 pg (ref 26.6–33.0)
MCHC: 33.7 g/dL (ref 31.5–35.7)
MCV: 91 fL (ref 79–97)
Monocytes Absolute: 0.6 10*3/uL (ref 0.1–0.9)
Monocytes: 9 %
Neutrophils Absolute: 3.5 10*3/uL (ref 1.4–7.0)
Neutrophils: 51 %
Platelets: 247 10*3/uL (ref 150–450)
RBC: 4.6 x10E6/uL (ref 4.14–5.80)
RDW: 12.8 % (ref 11.6–15.4)
WBC: 6.9 10*3/uL (ref 3.4–10.8)

## 2021-04-07 LAB — LIPID PANEL
Chol/HDL Ratio: 3.8 ratio (ref 0.0–5.0)
Cholesterol, Total: 159 mg/dL (ref 100–199)
HDL: 42 mg/dL (ref >39)
LDL Chol Calc (NIH): 69 mg/dL (ref 0–99)
Triglycerides: 298 mg/dL — ABNORMAL HIGH (ref 0–149)
VLDL Cholesterol Cal: 48 mg/dL — ABNORMAL HIGH (ref 5–40)

## 2021-04-07 LAB — BAYER DCA HB A1C WAIVED: HB A1C (BAYER DCA - WAIVED): 7.2 % — ABNORMAL HIGH (ref 4.8–5.6)

## 2021-04-07 MED ORDER — AMLODIPINE BESYLATE 5 MG PO TABS: 5.0000 mg | ORAL_TABLET | Freq: Every day | ORAL | 3 refills | Status: DC

## 2021-04-07 MED ORDER — FAMOTIDINE 20 MG PO TABS
20.0000 mg | ORAL_TABLET | Freq: Two times a day (BID) | ORAL | 3 refills | Status: DC
Start: 2021-04-07 — End: 2021-05-02

## 2021-04-07 MED ORDER — OZEMPIC (0.25 OR 0.5 MG/DOSE) 2 MG/1.5ML ~~LOC~~ SOPN: 0.5000 mg | PEN_INJECTOR | SUBCUTANEOUS | 3 refills | Status: DC

## 2021-04-07 NOTE — Addendum Note (Signed)
Addended by: Alphonzo Dublin on: 04/07/2021 09:49 AM   Modules accepted: Orders

## 2021-04-07 NOTE — Progress Notes (Signed)
BP (!) 147/73    Pulse (!) 58    Ht 5' 7" (1.702 m)    Wt 185 lb (83.9 kg)    SpO2 95%    BMI 28.98 kg/m    Subjective:   Patient ID: Jeffrey Rubio, male    DOB: 08-28-1945, 76 y.o.   MRN: [id number]  HPI: Jeffrey Rubio is a 76 y.o. male presenting on 04/07/2021 for Medical Management of Chronic Issues   HPI Type 2 diabetes mellitus Patient comes in today for recheck of his diabetes. Patient has been currently taking metformin and Ozempic. Patient is currently on an ACE inhibitor/ARB. Patient has seen an ophthalmologist this year. Patient denies any issues with their feet. The symptom started onset as an adult hypertension and high ARE RELATED TO DM   Hypertension Patient is currently on irbesartan and ozempic, and their blood pressure today is 147/73. Patient denies any lightheadedness or dizziness. Patient denies headaches, blurred vision, chest pains, shortness of breath, or weakness. Denies any side effects from medication and is content with current medication.   Hyperlipidemia Patient is coming in for recheck of his hyperlipidemia. The patient is currently taking pravastatin. They deny any issues with myalgias or history of liver damage from it. They deny any focal numbness or weakness or chest pain.   Relevant past medical, surgical, family and social history reviewed and updated as indicated. Interim medical history since our last visit reviewed. Allergies and medications reviewed and updated.  Review of Systems  Constitutional:  Negative for chills and fever.  Eyes:  Negative for visual disturbance.  Respiratory:  Negative for shortness of breath and wheezing.   Cardiovascular:  Negative for chest pain and leg swelling.  Musculoskeletal:  Negative for back pain and gait problem.  Skin:  Negative for rash.  Neurological:  Negative for dizziness, weakness and light-headedness.  All other systems reviewed and are negative.  Per HPI unless specifically indicated  above   Allergies as of 04/07/2021   No Known Allergies      Medication List        Accurate as of April 07, 2021  8:28 AM. If you have any questions, ask your nurse or doctor.          Accu-Chek Aviva Plus test strip Generic drug: glucose blood Test BS daily Dx E11.69   Accu-Chek Aviva Plus w/Device Kit USE to check blood sugars daily   acetaminophen 500 MG tablet Commonly known as: TYLENOL Take 500 mg by mouth every 6 (six) hours as needed for mild pain or moderate pain.   amLODipine 5 MG tablet Commonly known as: NORVASC Take 1 tablet (5 mg total) by mouth daily.   aspirin 81 MG tablet Take 81 mg by mouth daily.   famotidine 20 MG tablet Commonly known as: PEPCID Take 1 tablet (20 mg total) by mouth 2 (two) times daily.   irbesartan 300 MG tablet Commonly known as: AVAPRO Take 1 tablet (300 mg total) by mouth daily.   magnesium gluconate 500 MG tablet Commonly known as: MAGONATE Take 500 mg by mouth every other day.   meclizine 25 MG tablet Commonly known as: ANTIVERT Take 1 tablet (25 mg total) by mouth 3 (three) times daily as needed for dizziness.   Metamucil 28.3 % Powd Generic drug: Psyllium   metFORMIN 500 MG tablet Commonly known as: GLUCOPHAGE Take 1 tablet (500 mg total) by mouth 2 (two) times daily with a meal.   Ozempic (0.25 or  0.5 MG/DOSE) 2 MG/1.5ML Sopn Generic drug: Semaglutide(0.25 or 0.5MG/DOS) Inject 0.5 mg into the skin once a week.   pravastatin 20 MG tablet Commonly known as: PRAVACHOL Take 1 tablet (20 mg total) by mouth daily.   PROBIOTIC-10 PO Take by mouth daily.   traMADol 50 MG tablet Commonly known as: ULTRAM Take 1 tablet (50 mg total) by mouth every 12 (twelve) hours as needed.   TRUEplus Lancets 28G Misc Use to check blood sugar once daily and PRN Dx. E11.9   vitamin B-12 1000 MCG tablet Commonly known as: CYANOCOBALAMIN Take 1,000 mcg by mouth every other day.   Vitamin D3 50 MCG (2000 UT) Tabs Take  4,000 Units by mouth every morning.   zinc gluconate 50 MG tablet Take 50 mg by mouth daily.         Objective:   BP (!) 147/73    Pulse (!) 58    Ht 5' 7" (1.702 m)    Wt 185 lb (83.9 kg)    SpO2 95%    BMI 28.98 kg/m   Wt Readings from Last 3 Encounters:  04/07/21 185 lb (83.9 kg)  04/06/21 176 lb (79.8 kg)  03/07/21 175 lb (79.4 kg)    Physical Exam Vitals and nursing note reviewed.  Constitutional:      General: He is not in acute distress.    Appearance: He is well-developed. He is not diaphoretic.  Eyes:     General: No scleral icterus.    Conjunctiva/sclera: Conjunctivae normal.  Neck:     Thyroid: No thyromegaly.  Cardiovascular:     Rate and Rhythm: Normal rate and regular rhythm.     Heart sounds: Normal heart sounds. No murmur heard. Pulmonary:     Effort: Pulmonary effort is normal. No respiratory distress.     Breath sounds: Normal breath sounds. No wheezing.  Musculoskeletal:        General: Normal range of motion.     Cervical back: Neck supple.  Lymphadenopathy:     Cervical: No cervical adenopathy.  Skin:    General: Skin is warm and dry.     Findings: No rash.  Neurological:     Mental Status: He is alert and oriented to person, place, and time.     Coordination: Coordination normal.  Psychiatric:        Behavior: Behavior normal.      Assessment & Plan:   Problem List Items Addressed This Visit       Cardiovascular and Mediastinum   Hypertension associated with diabetes ([place])   Relevant Medications   amLODipine (NORVASC) 5 MG tablet   Semaglutide,0.25 or 0.5MG/DOS, (OZEMPIC, 0.25 OR 0.5 MG/DOSE,) 2 MG/1.5ML SOPN   Other Relevant Orders   CBC with Differential/Platelet   CMP14+EGFR   Lipid panel   Bayer DCA Hb A1c Waived     Endocrine   Hyperlipidemia associated with type 2 diabetes mellitus (HCC)   Relevant Medications   amLODipine (NORVASC) 5 MG tablet   Semaglutide,0.25 or 0.5MG/DOS, (OZEMPIC, 0.25 OR 0.5 MG/DOSE,) 2  MG/1.5ML SOPN   Other Relevant Orders   CBC with Differential/Platelet   CMP14+EGFR   Lipid panel   Bayer DCA Hb A1c Waived   DM (diabetes mellitus) (HCC) - Primary   Relevant Medications   Semaglutide,0.25 or 0.5MG/DOS, (OZEMPIC, 0.25 OR 0.5 MG/DOSE,) 2 MG/1.5ML SOPN   Other Relevant Orders   CBC with Differential/Platelet   CMP14+EGFR   Lipid panel   Bayer DCA Hb A1c Waived  Patient's A1c is up slightly at 6.2, focus on diet and exercise.  Follow-up for routine visit.  Likely from the holidays. Follow up plan: Return in about 3 months (around 07/06/2021), or if symptoms worsen or fail to improve, for dm and htn.  Counseling provided for all of the vaccine components Orders Placed This Encounter  Procedures   CBC with Differential/Platelet   CMP14+EGFR   Lipid panel   Bayer DCA Hb A1c Waived    Caryl Pina, MD [facility] 04/07/2021, 8:28 AM

## 2021-04-11 ENCOUNTER — Encounter: Payer: Self-pay | Admitting: Family Medicine

## 2021-04-30 ENCOUNTER — Other Ambulatory Visit: Payer: Self-pay | Admitting: Family Medicine

## 2021-04-30 DIAGNOSIS — E1169 Type 2 diabetes mellitus with other specified complication: Secondary | ICD-10-CM

## 2021-04-30 DIAGNOSIS — E1159 Type 2 diabetes mellitus with other circulatory complications: Secondary | ICD-10-CM

## 2021-05-17 ENCOUNTER — Telehealth: Payer: Self-pay | Admitting: Physical Medicine and Rehabilitation

## 2021-05-17 NOTE — Telephone Encounter (Signed)
Patient's wife called. He is wanting an injection with Dr. Ernestina Patches. His back is giving him problems again. Her call back number is [phone]

## 2021-05-17 NOTE — Telephone Encounter (Signed)
Patient's wife called. Returning a call to [facility]

## 2021-05-17 NOTE — Telephone Encounter (Signed)
Patient's wife called. Returning a call to [facility]

## 2021-05-18 ENCOUNTER — Ambulatory Visit (INDEPENDENT_AMBULATORY_CARE_PROVIDER_SITE_OTHER): Payer: Medicare Other | Admitting: Nurse Practitioner

## 2021-05-18 ENCOUNTER — Encounter: Payer: Self-pay | Admitting: Nurse Practitioner

## 2021-05-18 ENCOUNTER — Encounter: Payer: Self-pay | Admitting: Family Medicine

## 2021-05-18 VITALS — BP 121/77 | HR 95 | Temp 98.7°F | Ht 67.0 in | Wt 178.0 lb

## 2021-05-18 DIAGNOSIS — G8929 Other chronic pain: Secondary | ICD-10-CM

## 2021-05-18 DIAGNOSIS — F419 Anxiety disorder, unspecified: Secondary | ICD-10-CM

## 2021-05-18 DIAGNOSIS — M545 Low back pain, unspecified: Secondary | ICD-10-CM | POA: Diagnosis not present

## 2021-05-18 MED ORDER — METHYLPREDNISOLONE ACETATE 40 MG/ML IJ SUSP
40.0000 mg | Freq: Once | INTRAMUSCULAR | Status: AC
  Administered 2021-05-18: 40 mg via INTRAMUSCULAR

## 2021-05-18 MED ORDER — HYDROXYZINE HCL 10 MG PO TABS: 10.0000 mg | ORAL_TABLET | Freq: Three times a day (TID) | ORAL | 0 refills | Status: DC | PRN

## 2021-05-18 MED ORDER — LIDOCAINE 5 % EX PTCH: 1.0000 | MEDICATED_PATCH | CUTANEOUS | 0 refills | Status: DC

## 2021-05-18 NOTE — Patient Instructions (Signed)
Chronic Back Pain When back pain lasts longer than 3 months, it is called chronic back pain. Pain may get worse at certain times (flare-ups). There are things you can do at home to manage your pain. Follow these instructions at home: Pay attention to any changes in your symptoms. Take these actions to help with your pain: Managing pain and stiffness   If told, put ice on the painful area. Your doctor may tell you to use ice for 24-48 hours after the flare-up starts. To do this: Put ice in a plastic bag. Place a towel between your skin and the bag. Leave the ice on for 20 minutes, 2-3 times a day. If told, put heat on the painful area. Do this as often as told by your doctor. Use the heat source that your doctor recommends, such as a moist heat pack or a heating pad. Place a towel between your skin and the heat source. Leave the heat on for 20-30 minutes. Take off the heat if your skin turns bright red. This is especially important if you are unable to feel pain, heat, or cold. You may have a greater risk of getting burned. Soak in a warm bath. This can help relieve pain. Activity  Avoid bending and other activities that make pain worse. When standing: Keep your upper back and neck straight. Keep your shoulders pulled back. Avoid slouching. When sitting: Keep your back straight. Relax your shoulders. Do not round your shoulders or pull them backward. Do not sit or stand in one place for long periods of time. Take short rest breaks during the day. Lying down or standing is usually better than sitting. Resting can help relieve pain. When sitting or lying down for a long time, do some mild activity or stretching. This will help to prevent stiffness and pain. Get regular exercise. Ask your doctor what activities are safe for you. Do not lift anything that is heavier than 10 lb (4.5 kg) or the limit that you are told, until your doctor says that it is safe. To prevent injury when you lift  things: Bend your knees. Keep the weight close to your body. Avoid twisting. Sleep on a firm mattress. Try lying on your side with your knees slightly bent. If you lie on your back, put a pillow under your knees. Medicines Treatment may include medicines for pain and swelling taken by mouth or put on the skin, prescription pain medicine, or muscle relaxants. Take over-the-counter and prescription medicines only as told by your doctor. Ask your doctor if the medicine prescribed to you: Requires you to avoid driving or using machinery. Can cause trouble pooping (constipation). You may need to take these actions to prevent or treat trouble pooping: Drink enough fluid to keep your pee (urine) pale yellow. Take over-the-counter or prescription medicines. Eat foods that are high in fiber. These include beans, whole grains, and fresh fruits and vegetables. Limit foods that are high in fat and sugars. These include fried or sweet foods. General instructions Do not use any products that contain nicotine or tobacco, such as cigarettes, e-cigarettes, and chewing tobacco. If you need help quitting, ask your doctor. Keep all follow-up visits as told by your doctor. This is important. Contact a doctor if: Your pain does not get better with rest or medicine. Your pain gets worse, or you have new pain. You have a high fever. You lose weight very quickly. You have trouble doing your normal activities. Get help right away if: One   or both of your legs or feet feel weak. One or both of your legs or feet lose feeling (have numbness). You have trouble controlling when you poop (have a bowel movement) or pee (urinate). You have bad back pain and: You feel like you may vomit (nauseous), or you vomit. You have pain in your belly (abdomen). You have shortness of breath. You faint. Summary When back pain lasts longer than 3 months, it is called chronic back pain. Pain may get worse at certain times  (flare-ups). Use ice and heat as told by your doctor. Your doctor may tell you to use ice after flare-ups. This information is not intended to replace advice given to you by your health care provider. Make sure you discuss any questions you have with your health care provider. Document Revised: 04/30/2019 Document Reviewed: 04/30/2019 Elsevier Patient Education  2022 Elsevier Inc.  

## 2021-05-18 NOTE — Progress Notes (Signed)
Acute Office Visit  Subjective:    Patient ID: Jeffrey Rubio, male    DOB: 08/13/1945, 76 y.o.   MRN: [id number]  Chief Complaint  Patient presents with   Back Pain    Back Pain This is a chronic problem. The current episode started more than 1 year ago. The problem occurs constantly. The problem has been gradually worsening since onset. The pain is present in the lumbar spine. The pain is at a severity of 8/10. The pain is severe. The pain is The same all the time. The symptoms are aggravated by bending, lying down, position, sitting and standing. Stiffness is present All day. Pertinent negatives include no abdominal pain, bladder incontinence, bowel incontinence, chest pain, headaches, leg pain, numbness or pelvic pain. Treatments tried: steroid back injrctions. The treatment provided moderate relief.    Past Medical History:  Diagnosis Date   Asthmatic bronchitis    BPH (benign prostatic hypertrophy)    Cataracts, bilateral    Colon polyps    Diabetes mellitus    Diverticulosis    Fibromyalgia    GERD (gastroesophageal reflux disease)    Gout    History of nephrectomy, unilateral    left    Hyperlipidemia    Hypertension    Single kidney     Past Surgical History:  Procedure Laterality Date   APPENDECTOMY     CHOLECYSTECTOMY     COLONOSCOPY W/ POLYPECTOMY     EYE SURGERY     catarat - right    LEFT HEART CATH AND CORONARY ANGIOGRAPHY N/A 01/11/2017   Procedure: LEFT HEART CATH AND CORONARY ANGIOGRAPHY;  Surgeon: Martinique, Peter M, MD;  Location: [facility];  Service: Cardiovascular;  Laterality: N/A;   NEPHRECTOMY  1996   left , secondary to nephrolithiasis     Family History  Problem Relation Age of Onset   Coronary artery disease Other        family history    Hypertension Other        family history    Alzheimer's disease Mother 85   Cancer Father        prostate   Stroke Father    CAD Father 70       CABG   Hypertension Sister     Hypercalcemia Brother    Hypertension Brother    Kidney Stones Son    Breast cancer Maternal Grandmother    Diabetes Paternal Grandmother    Other Paternal Grandmother        Crite's disease   Suicidality Paternal Grandfather     Social History   Socioeconomic History   Marital status: Married    Spouse name: Pamala Hurry   Number of children: 2   Years of education: Not on file   Highest education level: Not on file  Occupational History   Occupation: retired    Comment: owns a farm  Tobacco Use   Smoking status: Former    Packs/day: 1.00    Years: 12.00    Pack years: 12.00    Types: Cigarettes    Quit date: 04/03/1970    Years since quitting: 51.1   Smokeless tobacco: Never  Vaping Use   Vaping Use: Never used  Substance and Sexual Activity   Alcohol use: No   Drug use: No   Sexual activity: Not Currently  Other Topics Concern   Not on file  Social History Narrative   Lives with wife on a farm.     Social  Determinants of Health   Financial Resource Strain: Low Risk    Difficulty of Paying Living Expenses: Not hard at all  Food Insecurity: No Food Insecurity   Worried About [place] in the Last Year: Never true   Ran Out of Food in the Last Year: Never true  Transportation Needs: No Transportation Needs   Lack of Transportation (Medical): No   Lack of Transportation (Non-Medical): No  Physical Activity: Insufficiently Active   Days of Exercise per Week: 5 days   Minutes of Exercise per Session: 20 min  Stress: No Stress Concern Present   Feeling of Stress : Not at all  Social Connections: Socially Integrated   Frequency of Communication with Friends and Family: Three times a week   Frequency of Social Gatherings with Friends and Family: Once a week   Attends Religious Services: More than 4 times per year   Active Member of [employer] or Organizations: Yes   Attends Music therapist: More than 4 times per year   Marital Status: Married   Human resources officer Violence: Not At Risk   Fear of Current or Ex-Partner: No   Emotionally Abused: No   Physically Abused: No   Sexually Abused: No    Outpatient Medications Prior to Visit  Medication Sig Dispense Refill   acetaminophen (TYLENOL) 500 MG tablet Take 500 mg by mouth every 6 (six) hours as needed for mild pain or moderate pain.     amLODipine (NORVASC) 5 MG tablet TAKE 1 TABLET DAILY 90 tablet 0   aspirin 81 MG tablet Take 81 mg by mouth daily.     Blood Glucose Monitoring Suppl (ACCU-CHEK AVIVA PLUS) w/Device KIT USE to check blood sugars daily 1 kit 0   Cholecalciferol (VITAMIN D3) 2000 units TABS Take 4,000 Units by mouth every morning.     famotidine (PEPCID) 20 MG tablet TAKE 1 TABLET TWICE A DAY 180 tablet 0   glucose blood (ACCU-CHEK AVIVA PLUS) test strip Test BS daily Dx E11.69 100 strip 3   irbesartan (AVAPRO) 300 MG tablet Take 1 tablet (300 mg total) by mouth daily. 90 tablet 3   magnesium gluconate (MAGONATE) 500 MG tablet Take 500 mg by mouth every other day.      meclizine (ANTIVERT) 25 MG tablet Take 1 tablet (25 mg total) by mouth 3 (three) times daily as needed for dizziness. 30 tablet 0   metFORMIN (GLUCOPHAGE) 500 MG tablet Take 1 tablet (500 mg total) by mouth 2 (two) times daily with a meal. 180 tablet 3   pravastatin (PRAVACHOL) 20 MG tablet TAKE 1 TABLET DAILY 90 tablet 0   Probiotic Product (PROBIOTIC-10 PO) Take by mouth daily.     Psyllium (METAMUCIL) 28.3 % POWD      Semaglutide,0.25 or 0.5MG/DOS, (OZEMPIC, 0.25 OR 0.5 MG/DOSE,) 2 MG/1.5ML SOPN Inject 0.5 mg into the skin once a week. 6 mL 3   traMADol (ULTRAM) 50 MG tablet Take 1 tablet (50 mg total) by mouth every 12 (twelve) hours as needed. 30 tablet 1   TRUEplus Lancets 28G MISC Use to check blood sugar once daily and PRN Dx. E11.9 100 each 3   vitamin B-12 (CYANOCOBALAMIN) 1000 MCG tablet Take 1,000 mcg by mouth every other day.     zinc gluconate 50 MG tablet Take 50 mg by mouth daily.      Facility-Administered Medications Prior to Visit  Medication Dose Route Frequency Provider Last Rate Last Admin   dexamethasone (DECADRON) injection 15  mg  15 mg Other Once Magnus Sinning, MD        No Known Allergies  Review of Systems  Constitutional: Negative.   HENT: Negative.    Respiratory: Negative.    Cardiovascular:  Negative for chest pain.  Gastrointestinal:  Negative for abdominal pain and bowel incontinence.  Endocrine: Negative.   Genitourinary: Negative.  Negative for bladder incontinence and pelvic pain.  Musculoskeletal:  Positive for back pain.  Skin:  Negative for rash.  Neurological:  Negative for numbness and headaches.  All other systems reviewed and are negative.     Objective:    Physical Exam Vitals and nursing note reviewed.  Constitutional:      Appearance: Normal appearance. He is normal weight.  HENT:     Head: Normocephalic.     Right Ear: External ear normal.     Left Ear: External ear normal.     Nose: Nose normal.     Mouth/Throat:     Mouth: Mucous membranes are moist.     Pharynx: Oropharynx is clear.  Eyes:     Conjunctiva/sclera: Conjunctivae normal.  Cardiovascular:     Rate and Rhythm: Normal rate and regular rhythm.     Pulses: Normal pulses.     Heart sounds: Normal heart sounds.  Pulmonary:     Effort: Pulmonary effort is normal.     Breath sounds: Normal breath sounds.  Abdominal:     General: Bowel sounds are normal.  Musculoskeletal:     Lumbar back: Tenderness present. Decreased range of motion.  Skin:    General: Skin is warm.     Findings: No rash.  Neurological:     General: No focal deficit present.     Mental Status: He is alert and oriented to person, place, and time.  Psychiatric:        Mood and Affect: Mood normal.        Behavior: Behavior normal.    BP 121/77    Pulse 95    Temp 98.7 F (37.1 C)    Ht '5\' 7"'  (1.702 m)    Wt 178 lb (80.7 kg)    SpO2 96%    BMI 27.88 kg/m  Wt Readings from Last 3  Encounters:  05/18/21 178 lb (80.7 kg)  04/07/21 185 lb (83.9 kg)  04/06/21 176 lb (79.8 kg)    Health Maintenance Due  Topic Date Due   COVID-19 Vaccine (3 - Booster for Moderna series) 06/23/2020   OPHTHALMOLOGY EXAM  05/18/2021    There are no preventive care reminders to display for this patient.   No results found for: TSH Lab Results  Component Value Date   WBC 6.9 04/07/2021   HGB 14.1 04/07/2021   HCT 41.8 04/07/2021   MCV 91 04/07/2021   PLT 247 04/07/2021   Lab Results  Component Value Date   NA 140 04/07/2021   K 4.9 04/07/2021   CO2 23 04/07/2021   GLUCOSE 170 (H) 04/07/2021   BUN 20 04/07/2021   CREATININE 1.39 (H) 04/07/2021   BILITOT 0.3 04/07/2021   ALKPHOS 93 04/07/2021   AST 15 04/07/2021   ALT 13 04/07/2021   PROT 6.8 04/07/2021   ALBUMIN 4.4 04/07/2021   CALCIUM 9.5 04/07/2021   EGFR 53 (L) 04/07/2021   Lab Results  Component Value Date   CHOL 159 04/07/2021   Lab Results  Component Value Date   HDL 42 04/07/2021   Lab Results  Component Value Date  LDLCALC 69 04/07/2021   Lab Results  Component Value Date   TRIG 298 (H) 04/07/2021   Lab Results  Component Value Date   CHOLHDL 3.8 04/07/2021   Lab Results  Component Value Date   HGBA1C 7.2 (H) 04/07/2021       Assessment & Plan:  Chronic lower back pain not well controlled.  Completed referral to orthopedic surgery, 40 mg of Depo-Medrol shot given in clinic.  5% lidocaine patch ordered.  Education provided to patient printed handouts given.  Follow-up with worsening hours of symptoms. Problem List Items Addressed This Visit   None Visit Diagnoses     Chronic bilateral low back pain without sciatica    -  Primary   Relevant Medications   lidocaine (LIDODERM) 5 %   Other Relevant Orders   Ambulatory referral to Orthopedic Surgery   Anxiety       Relevant Medications   hydrOXYzine (ATARAX) 10 MG tablet        Meds ordered this encounter  Medications   DISCONTD:  lidocaine (LIDODERM) 5 %    Sig: Place 1 patch onto the skin daily. Remove & Discard patch within 12 hours or as directed by MD    Dispense:  30 patch    Refill:  0    Order Specific Question:   Supervising Provider    Answer:   Claretta Fraise [phone]   DISCONTD: hydrOXYzine (ATARAX) 10 MG tablet    Sig: Take 1 tablet (10 mg total) by mouth 3 (three) times daily as needed.    Dispense:  30 tablet    Refill:  0    Order Specific Question:   Supervising Provider    Answer:   Claretta Fraise [[id number]]   hydrOXYzine (ATARAX) 10 MG tablet    Sig: Take 1 tablet (10 mg total) by mouth 3 (three) times daily as needed.    Dispense:  30 tablet    Refill:  0    Order Specific Question:   Supervising Provider    Answer:   Claretta Fraise [[id number]]   lidocaine (LIDODERM) 5 %    Sig: Place 1 patch onto the skin daily. Remove & Discard patch within 12 hours or as directed by MD    Dispense:  30 patch    Refill:  0    Order Specific Question:   Supervising Provider    Answer:   Jeneen Rinks     Ivy Lynn, NP

## 2021-05-18 NOTE — Addendum Note (Signed)
Addended by: Geryl Rankins D on: 05/18/2021 05:24 PM   Modules accepted: Orders

## 2021-05-19 ENCOUNTER — Emergency Department (HOSPITAL_COMMUNITY): Payer: Medicare Other

## 2021-05-19 ENCOUNTER — Encounter: Payer: Self-pay | Admitting: Family Medicine

## 2021-05-19 ENCOUNTER — Emergency Department (HOSPITAL_COMMUNITY)
Admission: EM | Admit: 2021-05-19 | Discharge: 2021-05-19 | Disposition: A | Discharge status: Home / Self Care | Payer: Medicare Other | Attending: Emergency Medicine | Admitting: Emergency Medicine

## 2021-05-19 ENCOUNTER — Encounter (HOSPITAL_COMMUNITY): Payer: Self-pay

## 2021-05-19 ENCOUNTER — Telehealth: Payer: Self-pay | Admitting: Radiology

## 2021-05-19 DIAGNOSIS — I10 Essential (primary) hypertension: Secondary | ICD-10-CM | POA: Insufficient documentation

## 2021-05-19 DIAGNOSIS — Z79899 Other long term (current) drug therapy: Secondary | ICD-10-CM | POA: Diagnosis not present

## 2021-05-19 DIAGNOSIS — Z87891 Personal history of nicotine dependence: Secondary | ICD-10-CM | POA: Diagnosis not present

## 2021-05-19 DIAGNOSIS — M545 Low back pain, unspecified: Secondary | ICD-10-CM | POA: Diagnosis not present

## 2021-05-19 DIAGNOSIS — M5441 Lumbago with sciatica, right side: Secondary | ICD-10-CM | POA: Diagnosis not present

## 2021-05-19 DIAGNOSIS — Z7984 Long term (current) use of oral hypoglycemic drugs: Secondary | ICD-10-CM | POA: Insufficient documentation

## 2021-05-19 DIAGNOSIS — E119 Type 2 diabetes mellitus without complications: Secondary | ICD-10-CM | POA: Insufficient documentation

## 2021-05-19 DIAGNOSIS — Z7982 Long term (current) use of aspirin: Secondary | ICD-10-CM | POA: Insufficient documentation

## 2021-05-19 DIAGNOSIS — M4316 Spondylolisthesis, lumbar region: Secondary | ICD-10-CM | POA: Diagnosis not present

## 2021-05-19 MED ORDER — HYDROMORPHONE HCL 1 MG/ML IJ SOLN
2.0000 mg | Freq: Once | INTRAMUSCULAR | Status: AC
  Administered 2021-05-19: 2 mg via INTRAMUSCULAR
  Filled 2021-05-19: qty 2

## 2021-05-19 MED ORDER — HYDROCODONE-ACETAMINOPHEN 5-325 MG PO TABS: 1.0000 | ORAL_TABLET | Freq: Four times a day (QID) | ORAL | 0 refills | Status: DC | PRN

## 2021-05-19 NOTE — Discharge Instructions (Addendum)
Take the pain medication as directed.  You can take 1 or 2 tablets.  Keep your appointment to follow-up with orthopedics.  Follow back up with your primary care doctor as needed.  Return for any new or worse symptoms.  Return for symptoms that are involving both legs at the same time.

## 2021-05-19 NOTE — ED Triage Notes (Signed)
Pt from home c/o chronic L2-L3 back/sciatic pain since Sunday. Followed by Dr. Lorin Mercy for same, steroid/lidocaine injections for pain, follow up appt Tuesday 2/21. States pain is "too much," he is unable to wait for relief.

## 2021-05-19 NOTE — Telephone Encounter (Signed)
Patient's wife called triage line this morning stating that patient is in significant pain and is having another episode with his back which started Sunday after getting out of the car. He had lumbar ESI in December which helped his pain up until Sunday. This injection was to be done prior to deciding if patient would have to have surgery. Patient's wife has called and requested repeat injection with Dr. Ernestina Patches. Patient also had office visit with PCP yesterday where they gave him lidocaine patches and something for anxiety. Per  his wife, this is not helping at all. He is requesting anything to help get him some relief. He originally had appt with Jeneen Rinks on Wednesday, but I have rescheduled this to a work in appt with Dr. Lorin Mercy on Tuesday.   Is there anything that you can offer patient until he can be seen by Dr. Lorin Mercy in the office on Tuesday? He uses [facility] in [place].  CB for patient's wife is [phone]

## 2021-05-19 NOTE — ED Provider Notes (Signed)
[facility] EMERGENCY DEPARTMENT Provider Note   CSN: [id number] Arrival date & time: 05/19/21  1547     History  Chief Complaint  Patient presents with   Back Pain    Jeffrey Rubio is a 76 y.o. male.  Patient with a complaint of right-sided back pain radiating into the right leg.  Patient known to have a history of sciatic back pain.  Followed by [facility] been seen by Dr. Lorin Mercy in the past.  Also has had injections in December of steroid in the area which was very helpful.  Pain came back starting first part of February.  Has been getting worse the last few days.  [facility]en [facility] yesterday treated with a steroid injection and lidocaine patches.  But not helping the pain the pain severe is not able to sleep.  Denies any numbness or weakness to the right foot.  No current difficulty with the left leg.  No incontinence.  Patient has follow-up again with Ortho care on February 21.  He is hopeful that they will be able to do a steroid injection again.  Patient is most comfortable in the sitting position.  Patient has had some trouble with the back for many years.  But became more significant in December.  Past medical history is significant for hypertension diabetes hyperlipidemia past diagnosis of fibromyalgia.  History of left nephrectomy.  Gastroesophageal reflux disease.  Surgical history significant for cholecystectomy appendectomy and is a former smoker quit 1972.  Patient also had left heart catheterization in 2018.      Home Medications Prior to Admission medications   Medication Sig Start Date End Date Taking? Authorizing Provider  acetaminophen (TYLENOL) 500 MG tablet Take 500 mg by mouth every 6 (six) hours as needed for mild pain or moderate pain.    [provider]  amLODipine (NORVASC) 5 MG tablet TAKE 1 TABLET DAILY 05/02/21   Dettinger, Fransisca Kaufmann, MD  aspirin 81 MG tablet Take 81 mg by mouth daily.    [provider]  Blood Glucose Monitoring Suppl (ACCU-CHEK AVIVA PLUS) w/Device KIT USE to check blood sugars daily 06/07/17   Dettinger, Fransisca Kaufmann, MD  Cholecalciferol (VITAMIN D3) 2000 units TABS Take 4,000 Units by mouth every morning.    [provider]  famotidine (PEPCID) 20 MG tablet TAKE 1 TABLET TWICE A DAY 05/02/21   Dettinger, Fransisca Kaufmann, MD  glucose blood (ACCU-CHEK AVIVA PLUS) test strip Test BS daily Dx E11.69 04/08/20   Dettinger, Fransisca Kaufmann, MD  hydrOXYzine (ATARAX) 10 MG tablet Take 1 tablet (10 mg total) by mouth 3 (three) times daily as needed. 05/18/21   Ivy Lynn, NP  irbesartan (AVAPRO) 300 MG tablet Take 1 tablet (300 mg total) by mouth daily. 09/27/20   Dettinger, Fransisca Kaufmann, MD  lidocaine (LIDODERM) 5 % Place 1 patch onto the skin daily. Remove & Discard patch within 12 hours or as directed by MD 05/18/21   Ivy Lynn, NP  magnesium gluconate (MAGONATE) 500 MG tablet Take 500 mg by mouth every other day.     [provider]  meclizine (ANTIVERT) 25 MG tablet Take 1 tablet (25 mg total) by mouth 3 (three) times daily as needed for dizziness. 06/12/18   Dettinger, Fransisca Kaufmann, MD  metFORMIN (GLUCOPHAGE) 500 MG tablet Take 1 tablet (500 mg total) by mouth 2 (two) times daily with a meal. 09/27/20   Dettinger, Fransisca Kaufmann, MD  pravastatin (  PRAVACHOL) 20 MG tablet TAKE 1 TABLET DAILY 05/02/21   Dettinger, Fransisca Kaufmann, MD  Probiotic Product (PROBIOTIC-10 PO) Take by mouth daily.    [provider]  Psyllium (METAMUCIL) 28.3 % POWD  07/02/13   [provider]  Semaglutide,0.25 or 0.5MG/DOS, (OZEMPIC, 0.25 OR 0.5 MG/DOSE,) 2 MG/1.5ML SOPN Inject 0.5 mg into the skin once a week. 04/07/21   Dettinger, Fransisca Kaufmann, MD  traMADol (ULTRAM) 50 MG tablet Take 1 tablet (50 mg total) by mouth every 12 (twelve) hours as needed. 10/15/20   Dettinger, Fransisca Kaufmann, MD  TRUEplus Lancets 28G MISC Use to check blood sugar once daily and PRN Dx. E11.9 03/23/20   Dettinger, Fransisca Kaufmann, MD   vitamin B-12 (CYANOCOBALAMIN) 1000 MCG tablet Take 1,000 mcg by mouth every other day.    [provider]  zinc gluconate 50 MG tablet Take 50 mg by mouth daily.    [provider]      Allergies    Patient has no known allergies.    Review of Systems   Review of Systems  Constitutional:  Negative for chills and fever.  HENT:  Negative for ear pain and sore throat.   Eyes:  Negative for pain and visual disturbance.  Respiratory:  Negative for cough and shortness of breath.   Cardiovascular:  Negative for chest pain and palpitations.  Gastrointestinal:  Negative for abdominal pain and vomiting.  Genitourinary:  Negative for dysuria and hematuria.  Musculoskeletal:  Positive for back pain. Negative for arthralgias.  Skin:  Negative for color change and rash.  Neurological:  Negative for seizures and syncope.  All other systems reviewed and are negative.  Physical Exam Updated Vital Signs BP (!) 146/94    Pulse 90    Temp 98 F (36.7 C) (Oral)    Resp 17    SpO2 98%  Physical Exam Vitals and nursing note reviewed.  Constitutional:      General: He is not in acute distress.    Appearance: Normal appearance. He is well-developed.  HENT:     Head: Normocephalic and atraumatic.  Eyes:     Conjunctiva/sclera: Conjunctivae normal.     Pupils: Pupils are equal, round, and reactive to light.  Cardiovascular:     Rate and Rhythm: Normal rate and regular rhythm.     Heart sounds: No murmur heard. Pulmonary:     Effort: Pulmonary effort is normal. No respiratory distress.     Breath sounds: Normal breath sounds.  Abdominal:     Palpations: Abdomen is soft.     Tenderness: There is no abdominal tenderness.  Musculoskeletal:        General: No swelling or tenderness.     Cervical back: Normal range of motion and neck supple.     Comments: No palpable tenderness of the back on the right side.  Skin:    General: Skin is warm and dry.     Capillary Refill: Capillary  refill takes less than 2 seconds.  Neurological:     General: No focal deficit present.     Mental Status: He is alert and oriented to person, place, and time.     Cranial Nerves: No cranial nerve deficit.     Sensory: No sensory deficit.     Motor: No weakness.  Psychiatric:        Mood and Affect: Mood normal.    ED Results / Procedures / Treatments   Labs (all labs ordered are listed, but only abnormal  results are displayed) Labs Reviewed - No data to display  EKG None  Radiology CT Lumbar Spine Wo Contrast  Result Date: 05/19/2021 CLINICAL DATA:  Chronic back pain EXAM: CT LUMBAR SPINE WITHOUT CONTRAST TECHNIQUE: Multidetector CT imaging of the lumbar spine was performed without intravenous contrast administration. Multiplanar CT image reconstructions were also generated. RADIATION DOSE REDUCTION: This exam was performed according to the departmental dose-optimization program which includes automated exposure control, adjustment of the mA and/or kV according to patient size and/or use of iterative reconstruction technique. COMPARISON:  MRI lumbar spine 11/02/2020 FINDINGS: Segmentation: 5 lumbar type vertebrae. Alignment: Stable grade 1 retrolisthesis of L2 on L3. Vertebrae: No acute fracture or focal pathologic process. Paraspinal and other soft tissues: No acute process identified. Left nephrectomy changes partially seen. Disc levels: Limited evaluation with CT. Mild to moderate disc bulge at L2-L3 and mild disc bulging at L3-L4 and L4-L5, likely similar to previous MRI. Facet arthropathy worse on the right and most significant at L4-L5 and L5-S1. IMPRESSION: No acute osseous abnormality identified. Grossly stable degenerative changes. Electronically Signed   By: Ofilia Neas M.D.   On: 05/19/2021 18:06    Procedures Procedures    Medications Ordered in ED Medications  HYDROmorphone (DILAUDID) injection 2 mg (2 mg Intramuscular Given 05/19/21 2233)    ED Course/ Medical  Decision Making/ A&P                           Medical Decision Making Risk Prescription drug management.   Patient with sciatica type pain in the right leg that goes down the lateral part of the leg below the knee area.  So not upper lumbar in nature.  No numbness or weakness to the right foot.  No incontinence.  No involvement of the left leg currently.  Patient just needs pain control.  Patient be given hydromorphone 2 mg IM here.  Prescription to pick up at the [facility] for hydrocodone to take.   Final Clinical Impression(s) / ED Diagnoses Final diagnoses:  Acute right-sided low back pain with right-sided sciatica    Rx / DC Orders ED Discharge Orders     None         Fredia Sorrow, MD 05/19/21 2309

## 2021-05-19 NOTE — ED Provider Triage Note (Signed)
Emergency Medicine Provider Triage Evaluation Note  Jeffrey Rubio , a 76 y.o. male  was evaluated in triage.  Pt complains of back pain.  States that same has been ongoing for several months now, had an MRI in August 2022 which showed "severe foraminal stenosis" was told he needed surgery, however in order to get insurance to cover it he had to go through a series of spinal injections first.  It states that he has been getting significant relief with his spinal injections being in December.  However, states that on the February 12 he stood up getting out of a car and had acute onset severe lumbar spinal pain that radiated down his right leg.  He states that the pain is severe and he is unable to get any relief despite several other interventions.  Has an appointment on 2/22 with his orthopedist but does not feel he can wait that long.  Denies any loss of bowel or bladder function or sacral paresthesias.  Review of Systems  Positive: Lumbar spine pain Negative: Loss of bowel or bladder function, sacral paresthesias, fever, chills  Physical Exam  BP (!) 152/93 (BP Location: Left Arm)    Pulse 96    Temp 97.7 F (36.5 C) (Oral)    Resp 20    SpO2 97%  Gen:   Awake, no distress   Resp:  Normal effort MSK:   Moves extremities without difficulty  Other:  Midline lumbar spinal tenderness without obvious step-offs or deformity  Medical Decision Making  Medically screening exam initiated at 4:32 PM.  Appropriate orders placed.  Audra Bellard was informed that the remainder of the evaluation will be completed by another provider, this initial triage assessment does not replace that evaluation, and the importance of remaining in the ED until their evaluation is complete.     Bud Face, PA-C 05/19/21 1635

## 2021-05-20 ENCOUNTER — Telehealth: Payer: Self-pay | Admitting: *Deleted

## 2021-05-20 ENCOUNTER — Telehealth: Payer: Self-pay | Admitting: Physical Medicine and Rehabilitation

## 2021-05-20 MED ORDER — HYDROCODONE-ACETAMINOPHEN 5-325 MG PO TABS: 1.0000 | ORAL_TABLET | Freq: Four times a day (QID) | ORAL | 0 refills | Status: DC | PRN

## 2021-05-20 NOTE — Telephone Encounter (Signed)
Needs meds re-routed

## 2021-05-20 NOTE — Telephone Encounter (Signed)
Patient wife aware of the below message

## 2021-05-20 NOTE — Telephone Encounter (Signed)
Pharmacy called related to Rx: [facility]...EDCM clarified with EDP ([place]) to send Rx to [facility] in [place], [place].

## 2021-05-20 NOTE — Telephone Encounter (Signed)
Pt returned call to set an appt. Please call pt at [phone]

## 2021-05-23 ENCOUNTER — Inpatient Hospital Stay: Payer: Medicare Other | Admitting: Nurse Practitioner

## 2021-05-24 ENCOUNTER — Encounter: Payer: Self-pay | Admitting: Orthopaedic Surgery

## 2021-05-24 ENCOUNTER — Other Ambulatory Visit: Payer: Self-pay

## 2021-05-24 ENCOUNTER — Ambulatory Visit (INDEPENDENT_AMBULATORY_CARE_PROVIDER_SITE_OTHER): Payer: Medicare Other | Admitting: Orthopaedic Surgery

## 2021-05-24 VITALS — BP 114/82 | HR 98 | Ht 67.0 in | Wt 178.0 lb

## 2021-05-24 DIAGNOSIS — M5126 Other intervertebral disc displacement, lumbar region: Secondary | ICD-10-CM

## 2021-05-24 MED ORDER — PREDNISONE 10 MG (21) PO TBPK: ORAL_TABLET | ORAL | 0 refills | Status: DC

## 2021-05-24 NOTE — Progress Notes (Signed)
Office Visit Note   Patient: Jeffrey Rubio           Date of Birth: March 07, 1946           MRN: [id number] Visit Date: 05/24/2021              Requested by: Dettinger, Fransisca Kaufmann, MD [place],  [place] [address] PCP: Dettinger, Fransisca Kaufmann, MD   Assessment & Plan: Visit Diagnoses:  1. Protrusion of lumbar intervertebral disc     Plan: Prednisone 10 mg Dosepak sent in.  If is not better by the end of the week we will proceed with reimaging MRI studies.  If he gets relief he can cancel his epidural with Dr. Ernestina Patches.  I will recheck him in 1 month.  Follow-Up Instructions: Return in about 1 month (around 06/21/2021).   Orders:  No orders of the defined types were placed in this encounter.  Meds ordered this encounter  Medications   predniSONE (STERAPRED UNI-PAK 21 TAB) 10 MG (21) TBPK tablet    Sig: Take as instructed 6,5,4,3,2,1 one tablet less daily, take with food    Dispense:  21 tablet    Refill:  0      Procedures: No procedures performed   Clinical Data: No additional findings.   Subjective: Chief Complaint  Patient presents with   Lower Back - Pain    HPI 76 year old male with several year history of back pain with spasms.  Previous injection in the fall which helped for a left L2-3 extraforaminal protrusion.  He has had more recent problems on the right side than left the most times been on the left.  12 days he has been able to straighten up his walking with his hands on his distal thigh and flex position.  He has used lidocaine patch, Dilaudid injection hydrocodone by the ED p.o.  Heating pad anti-inflammatories.  Previous MRI [facility] showed extraforaminal disc protrusion on the left moderate to severe foraminal compression and mild to moderate on the right also at L2-3.  No significant compression other levels.  No associated bowel or bladder symptoms.  Patient's wife is with him today.  Review of Systems all systems updated  noncontributory.   Objective: Vital Signs: BP 114/82    Pulse 98    Ht 5\' 7"  (1.702 m)    Wt 178 lb (80.7 kg)    BMI 27.88 kg/m   Physical Exam Constitutional:      Appearance: He is well-developed.  HENT:     Head: Normocephalic and atraumatic.     Right Ear: External ear normal.     Left Ear: External ear normal.  Eyes:     Pupils: Pupils are equal, round, and reactive to light.  Neck:     Thyroid: No thyromegaly.     Trachea: No tracheal deviation.  Cardiovascular:     Rate and Rhythm: Normal rate.  Pulmonary:     Effort: Pulmonary effort is normal.     Breath sounds: No wheezing.  Abdominal:     General: Bowel sounds are normal.     Palpations: Abdomen is soft.  Musculoskeletal:     Cervical back: Neck supple.  Skin:    General: Skin is warm and dry.     Capillary Refill: Capillary refill takes less than 2 seconds.  Neurological:     Mental Status: He is alert and oriented to person, place, and time.  Psychiatric:        Behavior:  Behavior normal.        Thought Content: Thought content normal.        Judgment: Judgment normal.    Ortho Exam patient ambulates with forward flexed position severe pain trying to get erect.  Negative logroll hips.  No pitting edema no rash or exposed skin.  No sensory loss anterior thighs or legs.  Specialty Comments:  No specialty comments available.  Imaging: No results found.   PMFS History: Patient Active Problem List   Diagnosis Date Noted   Protrusion of lumbar intervertebral disc 11/04/2020   Pain management contract signed 09/12/2018   Overweight (BMI 25.0-29.9) 12/07/2017   Abnormal cardiovascular stress test 01/11/2017   Urgency incontinence 12/28/2015   Restless leg syndrome 01/01/2015   Benign prostatic hyperplasia    Irritable bowel syndrome 04/15/2013   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   Hypertension associated with diabetes ([place]) 10/22/2012   Hyperlipidemia associated with type 2 diabetes mellitus  ([place]) 10/22/2012   DM (diabetes mellitus) ([place]) 10/22/2012   Past Medical History:  Diagnosis Date   Asthmatic bronchitis    BPH (benign prostatic hypertrophy)    Cataracts, bilateral    Colon polyps    Diabetes mellitus    Diverticulosis    Fibromyalgia    GERD (gastroesophageal reflux disease)    Gout    History of nephrectomy, unilateral    left    Hyperlipidemia    Hypertension    Single kidney     Family History  Problem Relation Age of Onset   Coronary artery disease Other        family history    Hypertension Other        family history    Alzheimer's disease Mother 81   Cancer Father        prostate   Stroke Father    CAD Father 102       CABG   Hypertension Sister    Hypercalcemia Brother    Hypertension Brother    Kidney Stones Son    Breast cancer Maternal Grandmother    Diabetes Paternal Grandmother    Other Paternal Grandmother        Crite's disease   Suicidality Paternal Grandfather     Past Surgical History:  Procedure Laterality Date   APPENDECTOMY     CHOLECYSTECTOMY     COLONOSCOPY W/ POLYPECTOMY     EYE SURGERY     catarat - right    LEFT HEART CATH AND CORONARY ANGIOGRAPHY N/A 01/11/2017   Procedure: LEFT HEART CATH AND CORONARY ANGIOGRAPHY;  Surgeon: Martinique, Peter M, MD;  Location: [facility];  Service: Cardiovascular;  Laterality: N/A;   NEPHRECTOMY  1996   left , secondary to nephrolithiasis    Social History   Occupational History   Occupation: retired    Comment: owns a farm  Tobacco Use   Smoking status: Former    Packs/day: 1.00    Years: 12.00    Pack years: 12.00    Types: Cigarettes    Quit date: 04/03/1970    Years since quitting: 51.1   Smokeless tobacco: Never  Vaping Use   Vaping Use: Never used  Substance and Sexual Activity   Alcohol use: No   Drug use: No   Sexual activity: Not Currently

## 2021-05-25 ENCOUNTER — Telehealth: Payer: Self-pay | Admitting: *Deleted

## 2021-05-25 ENCOUNTER — Ambulatory Visit: Payer: Medicare Other | Admitting: Surgery

## 2021-05-25 NOTE — Telephone Encounter (Addendum)
Hydroxizine   Approved on February 20 Approved. This drug has been approved under the Member's Medicare Part D benefit. Approved quantity: 30 tablets per 103 day(s). You may fill up to a 90 day supply except for those on Specialty Tier 5, which can be filled up to a 30 day supply. Please call the pharmacy to process the prescription claim   [facility]

## 2021-05-25 NOTE — Telephone Encounter (Signed)
Lidocaine 5% patches  Approvedon February 20 Approved. This drug has been approved under the Member's Medicare Part D benefit. Approved quantity: 30 units per 30 day(s). You may fill up to a 90 day supply except for those on Specialty Tier 5, which can be filled up to a 30 day supply. Please call the pharmacy to process the prescription claim.

## 2021-05-31 ENCOUNTER — Telehealth: Payer: Self-pay | Admitting: Orthopaedic Surgery

## 2021-05-31 ENCOUNTER — Other Ambulatory Visit: Payer: Self-pay | Admitting: Orthopaedic Surgery

## 2021-05-31 ENCOUNTER — Telehealth: Payer: Self-pay | Admitting: Physical Medicine and Rehabilitation

## 2021-05-31 ENCOUNTER — Telehealth: Payer: Self-pay | Admitting: Radiology

## 2021-05-31 DIAGNOSIS — M5126 Other intervertebral disc displacement, lumbar region: Secondary | ICD-10-CM

## 2021-05-31 MED ORDER — HYDROCODONE-ACETAMINOPHEN 5-325 MG PO TABS: 1.0000 | ORAL_TABLET | Freq: Four times a day (QID) | ORAL | 0 refills | Status: DC | PRN

## 2021-05-31 NOTE — Telephone Encounter (Signed)
Patient's wife called to Cx the appointment for patient. She advised do not wish to reschedule the appointment at this time

## 2021-05-31 NOTE — Telephone Encounter (Signed)
Pt's wife Pamala Hurry called states they have a lot of medical questions. She states Dr. Lorin Mercy told them to call if pain is persisting. Pt's wife states he is in severe pain and unable to sleep. Asking for a call back as soon as possible and also send another referral for MRI per Dr. Lorin Mercy conversation with pt and wife. Please call pt and wife about this matter. Phone number is [phone].

## 2021-05-31 NOTE — Telephone Encounter (Signed)
Can you make sure this gets approval thru insurance if needed? Jeffrey Rubio is off-SORRY! Jeffrey Rubio is scheduled for 12:30pm tomorrow, 06/01/2021 at [facility]   I thought it went to [employer], but wasn't sure   Message from [place] in [place] office. Spoke with [place]. No precert required.

## 2021-05-31 NOTE — Telephone Encounter (Signed)
I called patient. He states that the prednisone helped some while he was taking it, but the pain was back on Friday and it is increasing. He has an upcoming appt with Dr. Ernestina Patches for an injection, but did not know if you wanted him to keep that or to have a new MRI as you discussed at his last visit.  He is out of the hydrocodone, which helped his pain some, but did not really allow him to get much rest.  If you refill this, he would like for it to be sent to [facility] at [place].  Please advise.

## 2021-05-31 NOTE — Telephone Encounter (Signed)
I called Pamala Hurry and advised. If patient decides to go ahead with ESI, he will have to wait if surgery is indicated in new MRI scan. She requests scan be done at [facility] in [place] office to schedule.   Pamala Hurry to call back and cancel ESI if they decide not to do it. Advised hydrocodone sent to pharmacy.

## 2021-05-31 NOTE — Telephone Encounter (Signed)
[facility] Could [facility] please send to [facility] in [place]?

## 2021-06-01 DIAGNOSIS — M5116 Intervertebral disc disorders with radiculopathy, lumbar region: Secondary | ICD-10-CM | POA: Diagnosis not present

## 2021-06-01 DIAGNOSIS — M5126 Other intervertebral disc displacement, lumbar region: Secondary | ICD-10-CM | POA: Diagnosis not present

## 2021-06-05 ENCOUNTER — Other Ambulatory Visit: Payer: Self-pay | Admitting: Family Medicine

## 2021-06-06 ENCOUNTER — Other Ambulatory Visit: Payer: Self-pay | Admitting: Orthopaedic Surgery

## 2021-06-06 ENCOUNTER — Telehealth: Payer: Self-pay | Admitting: Orthopaedic Surgery

## 2021-06-06 ENCOUNTER — Ambulatory Visit: Payer: Medicare Other | Admitting: Physical Medicine and Rehabilitation

## 2021-06-06 DIAGNOSIS — M5126 Other intervertebral disc displacement, lumbar region: Secondary | ICD-10-CM

## 2021-06-06 MED ORDER — ACETAMINOPHEN-CODEINE #3 300-30 MG PO TABS: 1.0000 | ORAL_TABLET | Freq: Four times a day (QID) | ORAL | 0 refills | Status: DC | PRN

## 2021-06-06 NOTE — Telephone Encounter (Signed)
Patient was originally scheduled for lumbar ESI, however, Dr. Lorin Mercy ordered new MRI and was unsure if patient would need surgery. ESI was cancelled due to pending MRI and not wanting to have to wait on surgery if patient had injection. Dr. Lorin Mercy would now like for patient to proceed with ESI after new MRI. Could you please call and get him back on the schedule? New referral was entered. ?Thanks for your help. ?

## 2021-06-06 NOTE — Telephone Encounter (Signed)
Please advise 

## 2021-06-06 NOTE — Addendum Note (Signed)
Addended by: Meyer Cory on: 06/06/2021 03:17 PM ? ? Modules accepted: Orders ? ?

## 2021-06-06 NOTE — Telephone Encounter (Signed)
Patient's wife called. Says he need some pain medication. Tylenol #3 would be great she says. Not hydrocodone. Makes him feel a kind of way.  ?

## 2021-06-06 NOTE — Progress Notes (Signed)
Sent in # 30 tabs on tylenol # 3  one po q 6 hrs prn pain no refills to [facility] ?

## 2021-06-06 NOTE — Telephone Encounter (Signed)
I called patient's wife and advised.. She states that patient is hurting really badly and his anxiety and depression are getting worse because of it. Patient had MRI last week and they called to make appt which was scheduled for two weeks from now. I worked in to schedule Tues 3/14.  Is there anything else that can be done or that you recommend until then? She read the report from My Chart. ?

## 2021-06-07 ENCOUNTER — Telehealth: Payer: Self-pay | Admitting: Physical Medicine and Rehabilitation

## 2021-06-07 ENCOUNTER — Encounter: Payer: Self-pay | Admitting: Physical Medicine and Rehabilitation

## 2021-06-07 ENCOUNTER — Ambulatory Visit: Payer: Medicare Other | Admitting: Family Medicine

## 2021-06-07 NOTE — Telephone Encounter (Signed)
Pt's wife called about a referral sent to Dr. Ernestina Patches yesterday. Please call pt a to set appt [phone]. ?

## 2021-06-09 ENCOUNTER — Encounter: Payer: Self-pay | Admitting: Family Medicine

## 2021-06-09 ENCOUNTER — Ambulatory Visit (INDEPENDENT_AMBULATORY_CARE_PROVIDER_SITE_OTHER): Payer: Medicare Other | Admitting: Family Medicine

## 2021-06-09 VITALS — BP 148/88 | HR 77 | Ht 67.0 in | Wt 171.0 lb

## 2021-06-09 DIAGNOSIS — M5442 Lumbago with sciatica, left side: Secondary | ICD-10-CM | POA: Diagnosis not present

## 2021-06-09 DIAGNOSIS — G8929 Other chronic pain: Secondary | ICD-10-CM

## 2021-06-09 DIAGNOSIS — I152 Hypertension secondary to endocrine disorders: Secondary | ICD-10-CM

## 2021-06-09 DIAGNOSIS — E1159 Type 2 diabetes mellitus with other circulatory complications: Secondary | ICD-10-CM | POA: Diagnosis not present

## 2021-06-09 MED ORDER — DOXEPIN HCL 50 MG PO CAPS: 50.0000 mg | ORAL_CAPSULE | Freq: Every evening | ORAL | 2 refills | Status: DC | PRN

## 2021-06-09 NOTE — Progress Notes (Signed)
? ?BP (!) 148/88   Pulse 77   Ht _0  (1.702 m)   Wt 171 lb (77.6 kg)   SpO2 98%   BMI 26.78 kg/m?   ? ?Subjective:  ? ?Patient ID: Jeffrey Rubio, male    DOB: 03/05/1946, 76 y.o.   MRN: [id number] ? ?HPI: ?Jeffrey Rubio is a 76 y.o. male presenting on 06/09/2021 for Back Pain ? ? ?HPI ?Patient is coming in for back pain.  He continues to have lower back pain but especially shooting down into his right leg on the inner aspect of his upper leg and down into his lower leg near to his foot.  It is a sharp shooting pain and if he stands up straight he will feel it shooting in more down to his leg and into his foot.  He says he does feel some weakness in his legs and gets tired more easily as well.  He has seen orthopedic and spinal doctor and had an injection on the left side and now is going for the right side to see if that can help.  It did help on the left side.  He did have a reaction to the codeine and the hydrocodone that made him delirious and so he cannot take those. ? ?Relevant past medical, surgical, family and social history reviewed and updated as indicated. Interim medical history since our last visit reviewed. ?Allergies and medications reviewed and updated. ? ?Review of Systems  ?Constitutional:  Negative for chills and fever.  ?Eyes:  Negative for visual disturbance.  ?Respiratory:  Negative for shortness of breath and wheezing.   ?Cardiovascular:  Negative for chest pain and leg swelling.  ?Musculoskeletal:  Negative for back pain and gait problem.  ?Skin:  Negative for rash.  ?Neurological:  Negative for dizziness, weakness and light-headedness.  ?All other systems reviewed and are negative. ? ?Per HPI unless specifically indicated above ? ? ?Allergies as of 06/09/2021   ? ?   Reactions  ? Hydrocodone-acetaminophen   ? Hydroxyzine Hcl   ? Tylenol With Codeine #3 [acetaminophen-codeine]   ? ?  ? ?  ?Medication List  ?  ? ?  ? Accurate as of June 09, 2021  4:47 PM. If you have any questions,  ask your nurse or doctor.  ?  ?  ? ?  ? ?STOP taking these medications   ? ?acetaminophen-codeine 300-30 MG tablet ?Commonly known as: TYLENOL #3 ?Stopped by: Worthy Rancher, MD ?  ?hydrOXYzine 10 MG tablet ?Commonly known as: ATARAX ?Stopped by: Worthy Rancher, MD ?  ?predniSONE 10 MG (21) Tbpk tablet ?Commonly known as: STERAPRED UNI-PAK 21 TAB ?Stopped by: Worthy Rancher, MD ?  ? ?  ? ?TAKE these medications   ? ?Accu-Chek Aviva Plus test strip ?Generic drug: glucose blood ?TEST BLOOD SUGAR DAILY DX E11.69 ?  ?Accu-Chek Aviva Plus w/Device Kit ?USE to check blood sugars daily ?  ?amLODipine 5 MG tablet ?Commonly known as: NORVASC ?TAKE 1 TABLET DAILY ?  ?aspirin 81 MG tablet ?Take 81 mg by mouth daily. ?  ?doxepin 50 MG capsule ?Commonly known as: SINEQUAN ?Take 1 capsule (50 mg total) by mouth at bedtime as needed. ?Started by: Worthy Rancher, MD ?  ?famotidine 20 MG tablet ?Commonly known as: PEPCID ?TAKE 1 TABLET TWICE A DAY ?  ?irbesartan 300 MG tablet ?Commonly known as: AVAPRO ?Take 1 tablet (300 mg total) by mouth daily. ?  ?lidocaine 5 % ?Commonly known as: Lidoderm ?  Place 1 patch onto the skin daily. Remove & Discard patch within 12 hours or as directed by MD ?  ?magnesium gluconate 500 MG tablet ?Commonly known as: MAGONATE ?Take 500 mg by mouth every other day. ?  ?meclizine 25 MG tablet ?Commonly known as: ANTIVERT ?Take 1 tablet (25 mg total) by mouth 3 (three) times daily as needed for dizziness. ?  ?Metamucil 28.3 % Powd ?Generic drug: Psyllium ?  ?metFORMIN 500 MG tablet ?Commonly known as: GLUCOPHAGE ?Take 1 tablet (500 mg total) by mouth 2 (two) times daily with a meal. ?  ?Ozempic (0.25 or 0.5 MG/DOSE) 2 MG/1.5ML Sopn ?Generic drug: Semaglutide(0.25 or 0.5MG/DOS) ?Inject 0.5 mg into the skin once a week. ?  ?pravastatin 20 MG tablet ?Commonly known as: PRAVACHOL ?TAKE 1 TABLET DAILY ?  ?PROBIOTIC-10 PO ?Take by mouth daily. ?  ?TRUEplus Lancets 28G Misc ?Use to check blood sugar  once daily and PRN Dx. E11.9 ?  ?vitamin B-12 1000 MCG tablet ?Commonly known as: CYANOCOBALAMIN ?Take 1,000 mcg by mouth every other day. ?  ?Vitamin D3 50 MCG (2000 UT) Tabs ?Take 4,000 Units by mouth every morning. ?  ?zinc gluconate 50 MG tablet ?Take 50 mg by mouth daily. ?  ? ?  ? ? ? ?Objective:  ? ?BP (!) 148/88   Pulse 77   Ht _0  (1.702 m)   Wt 171 lb (77.6 kg)   SpO2 98%   BMI 26.78 kg/m?   ?Wt Readings from Last 3 Encounters:  ?06/09/21 171 lb (77.6 kg)  ?05/24/21 178 lb (80.7 kg)  ?05/18/21 178 lb (80.7 kg)  ?  ?Physical Exam ?Vitals and nursing note reviewed.  ?Constitutional:   ?   General: He is not in acute distress. ?   Appearance: He is well-developed. He is not diaphoretic.  ?Eyes:  ?   General: No scleral icterus. ?   Conjunctiva/sclera: Conjunctivae normal.  ?Musculoskeletal:  ?   Lumbar back: Positive right straight leg raise test.  ?Neurological:  ?   Mental Status: He is alert and oriented to person, place, and time.  ?   Coordination: Coordination normal.  ?Psychiatric:     ?   Behavior: Behavior normal.  ? ? ? ? ?Assessment & Plan:  ? ?Problem List Items Addressed This Visit   ? ?  ? Cardiovascular and Mediastinum  ? Hypertension associated with diabetes ([place])  ? ?Other Visit Diagnoses   ? ? Chronic bilateral low back pain with left-sided sciatica    -  Primary  ? Relevant Medications  ? doxepin (SINEQUAN) 50 MG capsule  ? ?  ?  ?Patient has meloxicam at home and will take 7.5 mg of meloxicam and will try the doxepin to see if it helps more with his nerve pain and helps him sleep. ?Follow up plan: ?Return if symptoms worsen or fail to improve. ? ?Counseling provided for all of the vaccine components ?No orders of the defined types were placed in this encounter. ? ? ?Caryl Pina, MD ?[place] ?06/09/2021, 4:47 PM ? ? ?  ?

## 2021-06-14 ENCOUNTER — Other Ambulatory Visit: Payer: Self-pay

## 2021-06-14 ENCOUNTER — Ambulatory Visit (INDEPENDENT_AMBULATORY_CARE_PROVIDER_SITE_OTHER): Payer: Medicare Other | Admitting: Orthopaedic Surgery

## 2021-06-14 DIAGNOSIS — M5126 Other intervertebral disc displacement, lumbar region: Secondary | ICD-10-CM

## 2021-06-14 NOTE — Progress Notes (Signed)
? ?Office Visit Note ?  ?Patient: Jeffrey Rubio           ?Date of Birth: 1945/10/20           ?MRN: [id number] ?Visit Date: 06/14/2021 ?             ?Requested by: Worthy Rancher, MD ?[address] ?[place],  [place] [address] ?PCP: Dettinger, Fransisca Kaufmann, MD ? ? ?Assessment & Plan: ?Visit Diagnoses:  ?1. Protrusion of lumbar intervertebral disc   ? ? ?Plan: ?Enlarged right L3-4 disc protrusion.  He also has some slight increase in the left L4-5 disc protrusion compared to his previous MRI but has no symptoms on the left currently.  He got great relief from his previous left side epidural we will order a right side epidural.  Reviewed the MRI it looks like it is just touching the nerve but he still has intact strength and intact reflexes.  Hopefully will get good relief again with the epidural.  He is scheduled for epidural next week and I will recheck him in 2 weeks. ? ?Follow-Up Instructions: Return in about 2 weeks (around 06/28/2021).  ? ?Orders:  ?No orders of the defined types were placed in this encounter. ? ?No orders of the defined types were placed in this encounter. ? ? ? ? Procedures: ?No procedures performed ? ? ?Clinical Data: ?No additional findings. ? ? ?Subjective: ?Chief Complaint  ?Patient presents with  ? Lower Back - Follow-up  ?  MRI review  ? ? ?HPI 76 year old male returns states he continues to be miserable with pain running down the right leg in his thigh primarily medial.  Previous history of epidural with Dr. Ernestina Patches ?On 03/07/2021 when he was having lateral disc protrusion on the left side and he got great relief.  This time he has protrusion at L3-4 on the right side which appears to have increased by MRI scan listed below.  MRI image was done at [facility].  When he took hydrocodone or Tylenol 3 states it makes him fall asleep he hallucinates.  He is having difficulty getting upright.  No associated bowel or bladder symptoms. ?Review of Systems all other systems  updated unchanged. ? ? ?Objective: ?Vital Signs: There were no vitals taken for this visit. ? ?Physical Exam ?Constitutional:   ?   Appearance: He is well-developed.  ?HENT:  ?   Head: Normocephalic and atraumatic.  ?   Right Ear: External ear normal.  ?   Left Ear: External ear normal.  ?Eyes:  ?   Pupils: Pupils are equal, round, and reactive to light.  ?Neck:  ?   Thyroid: No thyromegaly.  ?   Trachea: No tracheal deviation.  ?Cardiovascular:  ?   Rate and Rhythm: Normal rate.  ?Pulmonary:  ?   Effort: Pulmonary effort is normal.  ?   Breath sounds: No wheezing.  ?Abdominal:  ?   General: Bowel sounds are normal.  ?   Palpations: Abdomen is soft.  ?Musculoskeletal:  ?   Cervical back: Neck supple.  ?Skin: ?   General: Skin is warm and dry.  ?   Capillary Refill: Capillary refill takes less than 2 seconds.  ?Neurological:  ?   Mental Status: He is alert and oriented to person, place, and time.  ?Psychiatric:     ?   Behavior: Behavior normal.     ?   Thought Content: Thought content normal.     ?  Judgment: Judgment normal.  ? ? ?Ortho Exam skin over the back is normal he is tender at the lumbosacral junction.  Knee jerk is intact he does have good quad strength on the right and left.  He is amatory with the forward flexed position at the hips. ? ?Specialty Comments:  ?No specialty comments available. ? ?Imaging: ?Impression ? ?1. New/increased right foraminal disc protrusion at L3-L4, possibly  ?impinging on the exiting right L3 nerve root.  ?2. New/increased left subarticular disc protrusion at L4-L5 with  ?mass effect on the left L5 nerve root in the lateral recess.  ? ? ?Electronically Signed  ?  By: Ulyses Jarred M.D.  ?  On: 06/02/2021 03:56 ?Narrative ? ? ? ?PMFS History: ?Patient Active Problem List  ? Diagnosis Date Noted  ? Protrusion of lumbar intervertebral disc 11/04/2020  ? Pain management contract signed 09/12/2018  ? Overweight (BMI 25.0-29.9) 12/07/2017  ? Abnormal cardiovascular stress test  01/11/2017  ? Urgency incontinence 12/28/2015  ? Restless leg syndrome 01/01/2015  ? Benign prostatic hyperplasia   ? Irritable bowel syndrome 04/15/2013  ? 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  ? Hypertension associated with diabetes ([place]) 10/22/2012  ? Hyperlipidemia associated with type 2 diabetes mellitus ([place]) 10/22/2012  ? DM (diabetes mellitus) ([place]) 10/22/2012  ? ?Past Medical History:  ?Diagnosis Date  ? Asthmatic bronchitis   ? BPH (benign prostatic hypertrophy)   ? Cataracts, bilateral   ? Colon polyps   ? Diabetes mellitus   ? Diverticulosis   ? Fibromyalgia   ? GERD (gastroesophageal reflux disease)   ? Gout   ? History of nephrectomy, unilateral   ? left   ? Hyperlipidemia   ? Hypertension   ? Single kidney   ?  ?Family History  ?Problem Relation Age of Onset  ? Coronary artery disease Other   ?     family history   ? Hypertension Other   ?     family history   ? Alzheimer's disease Mother 22  ? Cancer Father   ?     prostate  ? Stroke Father   ? CAD Father 25  ?     CABG  ? Hypertension Sister   ? Hypercalcemia Brother   ? Hypertension Brother   ? Kidney Stones Son   ? Breast cancer Maternal Grandmother   ? Diabetes Paternal Grandmother   ? Other Paternal Grandmother   ?     Crite's disease  ? Suicidality Paternal Grandfather   ?  ?Past Surgical History:  ?Procedure Laterality Date  ? APPENDECTOMY    ? CHOLECYSTECTOMY    ? COLONOSCOPY W/ POLYPECTOMY    ? EYE SURGERY    ? catarat - right   ? LEFT HEART CATH AND CORONARY ANGIOGRAPHY N/A 01/11/2017  ? Procedure: LEFT HEART CATH AND CORONARY ANGIOGRAPHY;  Surgeon: Martinique, Peter M, MD;  Location: [facility];  Service: Cardiovascular;  Laterality: N/A;  ? NEPHRECTOMY  1996  ? left , secondary to nephrolithiasis   ? ?Social History  ? ?Occupational History  ? Occupation: retired  ?  Comment: owns a farm  ?Tobacco Use  ? Smoking status: Former  ?  Packs/day: 1.00  ?  Years: 12.00  ?  Pack years: 12.00  ?  Types: Cigarettes  ?  Quit date: 04/03/1970  ?   Years since quitting: 51.2  ? Smokeless tobacco: Never  ?Vaping Use  ? Vaping Use: Never used  ?Substance and Sexual Activity  ?  Alcohol use: No  ? Drug use: No  ? Sexual activity: Not Currently  ? ? ? ? ? ? ?

## 2021-06-21 ENCOUNTER — Ambulatory Visit: Payer: Medicare Other | Admitting: Orthopaedic Surgery

## 2021-06-22 ENCOUNTER — Ambulatory Visit (INDEPENDENT_AMBULATORY_CARE_PROVIDER_SITE_OTHER): Payer: Medicare Other | Admitting: Physical Medicine and Rehabilitation

## 2021-06-22 ENCOUNTER — Ambulatory Visit: Payer: Medicare Other

## 2021-06-22 ENCOUNTER — Other Ambulatory Visit: Payer: Self-pay

## 2021-06-22 ENCOUNTER — Encounter: Payer: Self-pay | Admitting: Physical Medicine and Rehabilitation

## 2021-06-22 VITALS — BP 167/74 | HR 76

## 2021-06-22 DIAGNOSIS — M5416 Radiculopathy, lumbar region: Secondary | ICD-10-CM

## 2021-06-22 MED ORDER — METHYLPREDNISOLONE ACETATE 80 MG/ML IJ SUSP
80.0000 mg | Freq: Once | INTRAMUSCULAR | Status: AC
  Administered 2021-06-22: 80 mg

## 2021-06-22 NOTE — Progress Notes (Signed)
Pt state lower back pain that travels down right leg. Pt state any movement makes the pain worse. Pt state he takes pain meds to help ease his pain. ? ?Numeric Pain Rating Scale and Functional Assessment ?Average Pain 8 ? ? ?In the last MONTH (on 0-10 scale) has pain interfered with the following? ? ?1. General activity like being  able to carry out your everyday physical activities such as walking, climbing stairs, carrying groceries, or moving a chair?  ?Rating(10) ? ? ?+Driver, -BT, -Dye Allergies. ? ?

## 2021-06-22 NOTE — Patient Instructions (Signed)

## 2021-06-25 ENCOUNTER — Encounter: Payer: Self-pay | Admitting: Physical Medicine and Rehabilitation

## 2021-06-28 ENCOUNTER — Encounter: Payer: Self-pay | Admitting: Orthopaedic Surgery

## 2021-06-28 ENCOUNTER — Other Ambulatory Visit: Payer: Self-pay

## 2021-06-28 ENCOUNTER — Ambulatory Visit (INDEPENDENT_AMBULATORY_CARE_PROVIDER_SITE_OTHER): Payer: Medicare Other | Admitting: Orthopaedic Surgery

## 2021-06-28 VITALS — BP 132/93 | HR 94 | Ht 67.0 in | Wt 171.0 lb

## 2021-06-28 DIAGNOSIS — M5126 Other intervertebral disc displacement, lumbar region: Secondary | ICD-10-CM

## 2021-06-28 NOTE — Progress Notes (Signed)
? ?Jeffrey Rubio - 76 y.o. male MRN [id number]  Date of birth: 09-Apr-1945 ? ?Office Visit Note: ?Visit Date: 06/22/2021 ?PCP: Dettinger, Fransisca Kaufmann, MD ?Referred by: Dettinger, Fransisca Kaufmann, MD ? ?Subjective: ?Chief Complaint  ?Patient presents with  ? Lower Back - Pain  ? Right Leg - Pain  ? ?HPI:  Jeffrey Rubio is a 76 y.o. male who comes in today at the request of Dr. Rodell Perna for planned repeat Lumbar Transforaminal epidural steroid injection with fluoroscopic guidance.  The patient has failed conservative care including home exercise, medications, time and activity modification.  This injection will be diagnostic and hopefully therapeutic.  Please see requesting physician notes for further details and justification.  Patient had good relief with a left-sided L3 transforaminal injection in the past with small foraminal protrusion.  Request was for repeat epidural injection but symptoms are now on the right side.  Based on symptomatology and pattern we did decide to complete right L4 transforaminal injection.  He is getting some groin type pain as well.  We will follow-up with Dr. Lorin Mercy.  His current MRI does not necessarily correlate very well with his pain pattern at this point. ? ?ROS Otherwise per HPI. ? ?Assessment & Plan: ?Visit Diagnoses:  ?  ICD-10-CM   ?1. Lumbar radiculopathy  M54.16 XR C-ARM NO REPORT  ?  Epidural Steroid injection  ?  methylPREDNISolone acetate (DEPO-MEDROL) injection 80 mg  ?  ?  ?Plan: No additional findings.  ? ?Meds & Orders:  ?Meds ordered this encounter  ?Medications  ? methylPREDNISolone acetate (DEPO-MEDROL) injection 80 mg  ?  ?Orders Placed This Encounter  ?Procedures  ? XR C-ARM NO REPORT  ? Epidural Steroid injection  ?  ?Follow-up: Return for Rodell Perna, MD.  ? ?Procedures: ?No procedures performed  ?Lumbosacral Transforaminal Epidural Steroid Injection - Sub-Pedicular Approach with Fluoroscopic Guidance ? ?Patient: Jeffrey Rubio      ?Date of Birth:  Aug 12, 1945 ?MRN: [id number] ?PCP: Dettinger, Fransisca Kaufmann, MD      ?Visit Date: 06/22/2021 ?  ?Universal Protocol:    ?Date/Time: 06/22/2021 ? ?Consent Given By: the patient ? ?Position: PRONE ? ?Additional Comments: ?Vital signs were monitored before and after the procedure. ?Patient was prepped and draped in the usual sterile fashion. ?The correct patient, procedure, and site was verified. ? ? ?Injection Procedure Details:  ? ?Procedure diagnoses: Lumbar radiculopathy [M54.16]   ? ?Meds Administered:  ?Meds ordered this encounter  ?Medications  ? methylPREDNISolone acetate (DEPO-MEDROL) injection 80 mg  ? ? ?Laterality: Right ? ?Location/Site: L4 ? ?Needle:5.0 in., 22 ga.  Short bevel or Quincke spinal needle ? ?Needle Placement: Transforaminal ? ?Findings: ?  ? -Comments: Excellent flow of contrast along the nerve, nerve root and into the epidural space. ? ?Procedure Details: ?After squaring off the end-plates to get a true AP view, the C-arm was positioned so that an oblique view of the foramen as noted above was visualized. The target area is just inferior to the "nose of the scotty dog" or sub pedicular. The soft tissues overlying this structure were infiltrated with 2-3 ml. of 1% Lidocaine without Epinephrine. ? ?The spinal needle was inserted toward the target using a "trajectory" view along the fluoroscope beam.  Under AP and lateral visualization, the needle was advanced so it did not puncture dura and was located close the 6 O'Clock position of the pedical in AP tracterory. Biplanar projections were used to confirm position. Aspiration was confirmed to be negative for CSF  and/or blood. A 1-2 ml. volume of Isovue-250 was injected and flow of contrast was noted at each level. Radiographs were obtained for documentation purposes.  ? ?After attaining the desired flow of contrast documented above, a 0.5 to 1.0 ml test dose of 0.25% Marcaine was injected into each respective transforaminal space.  The patient was  observed for 90 seconds post injection.  After no sensory deficits were reported, and normal lower extremity motor function was noted,   the above injectate was administered so that equal amounts of the injectate were placed at each foramen (level) into the transforaminal epidural space. ? ? ?Additional Comments:  ?The patient tolerated the procedure well ?Dressing: 2 x 2 sterile gauze and Band-Aid ?  ? ?Post-procedure details: ?Patient was observed during the procedure. ?Post-procedure instructions were reviewed. ? ?Patient left the clinic in stable condition. ?  ? ?Clinical History: ?MRI LUMBAR SPINE WITHOUT CONTRAST  ? ?TECHNIQUE:  ?Multiplanar, multisequence MR imaging of the lumbar spine was  ?performed. No intravenous contrast was administered.  ? ?COMPARISON:  Radiographs October 13, 2020  ? ?FINDINGS:  ?Segmentation:  Standard.  ? ?Alignment:  Small retrolisthesis of L2 over L3.  ? ?Vertebrae:  No fracture, evidence of discitis, or bone lesion.  ? ?Conus medullaris and cauda equina: Conus extends to the L1 level.  ?Conus and cauda equina appear normal.  ? ?Paraspinal and other soft tissues: Mild nodular thickening of the  ?left adrenal gland, measuring up to 9 mm, similar to prior CT  ?performed in 2015. Absent left kidney.  ? ?Disc levels:  ? ?T12-L1: Shallow disc bulge. No spinal canal or neural foraminal  ?stenosis.  ? ?L1-2: Mild facet degenerative changes. No spinal canal or neural  ?foraminal stenosis.  ? ?L2-3: Disc bulge with superimposed left central/foraminal disc  ?protrusion and mild facet degenerative changes resulting in  ?narrowing of the left subarticular zone, mild right and moderate  ?left neural foraminal narrowing.  ? ?L3-4: Shallow disc bulge and mild facet degenerative changes without  ?significant spinal canal or neural foraminal stenosis.  ? ?L4-5: Shallow disc bulge and mild facet degenerative changes  ?resulting in mild bilateral neural foraminal narrowing.  ? ?L5-S1: Moderate facet  degenerative changes. No spinal canal or  ?neural foraminal stenosis.  ? ?IMPRESSION:  ?Degenerative changes of the lumbar spine, more pronounced at L2-3  ?where a left central/foraminal disc protrusion contributes for  ?narrowing of the left subarticular zone and moderate left neural  ?foraminal narrowing. There is also mild right neural foraminal  ?narrowing at this level.  ? ? ?Electronically Signed  ?  By: Pedro Earls M.D.  ?  On: 11/03/2020 14:35  ? ? ? ?Objective:  VS:  HT:    WT:   BMI:     BP:(!) 167/74  HR:76bpm  TEMP: ( )  RESP:  ?Physical Exam ?Vitals and nursing note reviewed.  ?Constitutional:   ?   General: He is not in acute distress. ?   Appearance: Normal appearance. He is not ill-appearing.  ?HENT:  ?   Head: Normocephalic and atraumatic.  ?   Right Ear: External ear normal.  ?   Left Ear: External ear normal.  ?   Nose: No congestion.  ?Eyes:  ?   Extraocular Movements: Extraocular movements intact.  ?Cardiovascular:  ?   Rate and Rhythm: Normal rate.  ?   Pulses: Normal pulses.  ?Pulmonary:  ?   Effort: Pulmonary effort is normal. No respiratory distress.  ?Abdominal:  ?  General: There is no distension.  ?   Palpations: Abdomen is soft.  ?Musculoskeletal:     ?   General: No tenderness or signs of injury.  ?   Cervical back: Neck supple.  ?   Right lower leg: No edema.  ?   Left lower leg: No edema.  ?   Comments: Patient has good distal strength without clonus.  ?Skin: ?   Findings: No erythema or rash.  ?Neurological:  ?   General: No focal deficit present.  ?   Mental Status: He is alert and oriented to person, place, and time.  ?   Sensory: No sensory deficit.  ?   Motor: No weakness or abnormal muscle tone.  ?   Coordination: Coordination normal.  ?Psychiatric:     ?   Mood and Affect: Mood normal.     ?   Behavior: Behavior normal.  ?  ? ?Imaging: ?No results found. ?

## 2021-06-28 NOTE — Procedures (Signed)
Lumbosacral Transforaminal Epidural Steroid Injection - Sub-Pedicular Approach with Fluoroscopic Guidance ? ?Patient: Jeffrey Rubio      ?Date of Birth: 1946/03/14 ?MRN: [id number] ?PCP: Dettinger, Fransisca Kaufmann, MD      ?Visit Date: 06/22/2021 ?  ?Universal Protocol:    ?Date/Time: 06/22/2021 ? ?Consent Given By: the patient ? ?Position: PRONE ? ?Additional Comments: ?Vital signs were monitored before and after the procedure. ?Patient was prepped and draped in the usual sterile fashion. ?The correct patient, procedure, and site was verified. ? ? ?Injection Procedure Details:  ? ?Procedure diagnoses: Lumbar radiculopathy [M54.16]   ? ?Meds Administered:  ?Meds ordered this encounter  ?Medications  ? methylPREDNISolone acetate (DEPO-MEDROL) injection 80 mg  ? ? ?Laterality: Right ? ?Location/Site: L4 ? ?Needle:5.0 in., 22 ga.  Short bevel or Quincke spinal needle ? ?Needle Placement: Transforaminal ? ?Findings: ?  ? -Comments: Excellent flow of contrast along the nerve, nerve root and into the epidural space. ? ?Procedure Details: ?After squaring off the end-plates to get a true AP view, the C-arm was positioned so that an oblique view of the foramen as noted above was visualized. The target area is just inferior to the "nose of the scotty dog" or sub pedicular. The soft tissues overlying this structure were infiltrated with 2-3 ml. of 1% Lidocaine without Epinephrine. ? ?The spinal needle was inserted toward the target using a "trajectory" view along the fluoroscope beam.  Under AP and lateral visualization, the needle was advanced so it did not puncture dura and was located close the 6 O'Clock position of the pedical in AP tracterory. Biplanar projections were used to confirm position. Aspiration was confirmed to be negative for CSF and/or blood. A 1-2 ml. volume of Isovue-250 was injected and flow of contrast was noted at each level. Radiographs were obtained for documentation purposes.  ? ?After attaining the  desired flow of contrast documented above, a 0.5 to 1.0 ml test dose of 0.25% Marcaine was injected into each respective transforaminal space.  The patient was observed for 90 seconds post injection.  After no sensory deficits were reported, and normal lower extremity motor function was noted,   the above injectate was administered so that equal amounts of the injectate were placed at each foramen (level) into the transforaminal epidural space. ? ? ?Additional Comments:  ?The patient tolerated the procedure well ?Dressing: 2 x 2 sterile gauze and Band-Aid ?  ? ?Post-procedure details: ?Patient was observed during the procedure. ?Post-procedure instructions were reviewed. ? ?Patient left the clinic in stable condition. ? ?

## 2021-06-28 NOTE — Progress Notes (Signed)
? ?Office Visit Note ?  ?Patient: Jeffrey Rubio           ?Date of Birth: 11-19-1945           ?MRN: [id number] ?Visit Date: 06/28/2021 ?             ?Requested by: Jeffrey Rancher, MD ?[address] ?[place],  [place] [address] ?PCP: Dettinger, Fransisca Kaufmann, MD ? ? ?Assessment & Plan: ?Visit Diagnoses:  ?1. Protrusion of lumbar intervertebral disc   ? ? ?Plan: Patient states he wants to proceed with surgery and is fearful that he will get in severe pain like he was before.  I discussed I think he may continue to improve I will recheck him in 3 weeks.  If he goes the opposite direction and starts having severe pain again and cannot tolerate it he states he could not sleep and could not even make it to the bathroom and I can always post him and proceed with microdiscectomy.  Recheck 3 weeks.  He will continue walking daily but avoid bending turning twisting at this point. ? ?Follow-Up Instructions: Return in about 3 weeks (around 07/19/2021).  ? ?Orders:  ?No orders of the defined types were placed in this encounter. ? ?No orders of the defined types were placed in this encounter. ? ? ? ? Procedures: ?No procedures performed ? ? ?Clinical Data: ?No additional findings. ? ? ?Subjective: ?Chief Complaint  ?Patient presents with  ? Lower Back - Pain, Follow-up  ? ? ?HPI 76 year old male returns post epidural on the left L4-5 for new disc protrusion with L5 nerve root compression.  Since his epidural 6 days ago he states he is 50% better.  His wife still taking care of the chickens and the cows in the barn.  He is taking less medication.  He is able to get completely upright but feels better walking with slight flexed position at the waist.  No bowel bladder symptoms no fever or chills.  This was a second epidural previous was on the right at L3-4. ? ?Review of Systems updated unchanged ? ? ?Objective: ?Vital Signs: BP (!) 132/93   Pulse 94   Ht '5\' 7"'$  (1.702 m)   Wt 171 lb (77.6 kg)   BMI 26.78 kg/m?  ? ?Physical  Exam ?Constitutional:   ?   Appearance: He is well-developed.  ?HENT:  ?   Head: Normocephalic and atraumatic.  ?   Right Ear: External ear normal.  ?   Left Ear: External ear normal.  ?Eyes:  ?   Pupils: Pupils are equal, round, and reactive to light.  ?Neck:  ?   Thyroid: No thyromegaly.  ?   Trachea: No tracheal deviation.  ?Cardiovascular:  ?   Rate and Rhythm: Normal rate.  ?Pulmonary:  ?   Effort: Pulmonary effort is normal.  ?   Breath sounds: No wheezing.  ?Abdominal:  ?   General: Bowel sounds are normal.  ?   Palpations: Abdomen is soft.  ?Musculoskeletal:  ?   Cervical back: Neck supple.  ?Skin: ?   General: Skin is warm and dry.  ?   Capillary Refill: Capillary refill takes less than 2 seconds.  ?Neurological:  ?   Mental Status: He is alert and oriented to person, place, and time.  ?Psychiatric:     ?   Behavior: Behavior normal.     ?   Thought Content: Thought content normal.     ?   Judgment: Judgment  normal.  ? ? ?Ortho Exam patient gets from sitting to standing.  He can stand completely straight but ambulates with a flexed position at the waist.  Anterior tib gastrocsoleus is active and strong. ? ?Specialty Comments:  ?MRI LUMBAR SPINE WITHOUT CONTRAST  ? ?TECHNIQUE:  ?Multiplanar, multisequence MR imaging of the lumbar spine was  ?performed. No intravenous contrast was administered.  ? ?COMPARISON:  Radiographs October 13, 2020  ? ?FINDINGS:  ?Segmentation:  Standard.  ? ?Alignment:  Small retrolisthesis of L2 over L3.  ? ?Vertebrae:  No fracture, evidence of discitis, or bone lesion.  ? ?Conus medullaris and cauda equina: Conus extends to the L1 level.  ?Conus and cauda equina appear normal.  ? ?Paraspinal and other soft tissues: Mild nodular thickening of the  ?left adrenal gland, measuring up to 9 mm, similar to prior CT  ?performed in 2015. Absent left kidney.  ? ?Disc levels:  ? ?T12-L1: Shallow disc bulge. No spinal canal or neural foraminal  ?stenosis.  ? ?L1-2: Mild facet degenerative  changes. No spinal canal or neural  ?foraminal stenosis.  ? ?L2-3: Disc bulge with superimposed left central/foraminal disc  ?protrusion and mild facet degenerative changes resulting in  ?narrowing of the left subarticular zone, mild right and moderate  ?left neural foraminal narrowing.  ? ?L3-4: Shallow disc bulge and mild facet degenerative changes without  ?significant spinal canal or neural foraminal stenosis.  ? ?L4-5: Shallow disc bulge and mild facet degenerative changes  ?resulting in mild bilateral neural foraminal narrowing.  ? ?L5-S1: Moderate facet degenerative changes. No spinal canal or  ?neural foraminal stenosis.  ? ?IMPRESSION:  ?Degenerative changes of the lumbar spine, more pronounced at L2-3  ?where a left central/foraminal disc protrusion contributes for  ?narrowing of the left subarticular zone and moderate left neural  ?foraminal narrowing. There is also mild right neural foraminal  ?narrowing at this level.  ? ? ?Electronically Signed  ?  By: Jeffrey Rubio M.D.  ?  On: 11/03/2020 14:35 ? ?Imaging: ?No results found. ? ? ?PMFS History: ?Patient Active Problem List  ? Diagnosis Date Noted  ? Protrusion of lumbar intervertebral disc 11/04/2020  ? Pain management contract signed 09/12/2018  ? Overweight (BMI 25.0-29.9) 12/07/2017  ? Abnormal cardiovascular stress test 01/11/2017  ? Urgency incontinence 12/28/2015  ? Restless leg syndrome 01/01/2015  ? Benign prostatic hyperplasia   ? Irritable bowel syndrome 04/15/2013  ? 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  ? Hypertension associated with diabetes ([place]) 10/22/2012  ? Hyperlipidemia associated with type 2 diabetes mellitus ([place]) 10/22/2012  ? DM (diabetes mellitus) ([place]) 10/22/2012  ? ?Past Medical History:  ?Diagnosis Date  ? Asthmatic bronchitis   ? BPH (benign prostatic hypertrophy)   ? Cataracts, bilateral   ? Colon polyps   ? Diabetes mellitus   ? Diverticulosis   ? Fibromyalgia   ? GERD (gastroesophageal reflux disease)   ?  Gout   ? History of nephrectomy, unilateral   ? left   ? Hyperlipidemia   ? Hypertension   ? Single kidney   ?  ?Family History  ?Problem Relation Age of Onset  ? Coronary artery disease Other   ?     family history   ? Hypertension Other   ?     family history   ? Alzheimer's disease Mother 68  ? Cancer Father   ?     prostate  ? Stroke Father   ? CAD Father 70  ?  CABG  ? Hypertension Sister   ? Hypercalcemia Brother   ? Hypertension Brother   ? Kidney Stones Son   ? Breast cancer Maternal Grandmother   ? Diabetes Paternal Grandmother   ? Other Paternal Grandmother   ?     Crite's disease  ? Suicidality Paternal Grandfather   ?  ?Past Surgical History:  ?Procedure Laterality Date  ? APPENDECTOMY    ? CHOLECYSTECTOMY    ? COLONOSCOPY W/ POLYPECTOMY    ? EYE SURGERY    ? catarat - right   ? LEFT HEART CATH AND CORONARY ANGIOGRAPHY N/A 01/11/2017  ? Procedure: LEFT HEART CATH AND CORONARY ANGIOGRAPHY;  Surgeon: Martinique, Peter M, MD;  Location: [facility];  Service: Cardiovascular;  Laterality: N/A;  ? NEPHRECTOMY  1996  ? left , secondary to nephrolithiasis   ? ?Social History  ? ?Occupational History  ? Occupation: retired  ?  Comment: owns a farm  ?Tobacco Use  ? Smoking status: Former  ?  Packs/day: 1.00  ?  Years: 12.00  ?  Pack years: 12.00  ?  Types: Cigarettes  ?  Quit date: 04/03/1970  ?  Years since quitting: 51.2  ? Smokeless tobacco: Never  ?Vaping Use  ? Vaping Use: Never used  ?Substance and Sexual Activity  ? Alcohol use: No  ? Drug use: No  ? Sexual activity: Not Currently  ? ? ? ? ? ? ?

## 2021-07-03 ENCOUNTER — Encounter: Payer: Self-pay | Admitting: Orthopaedic Surgery

## 2021-07-03 ENCOUNTER — Encounter: Payer: Self-pay | Admitting: Family Medicine

## 2021-07-04 NOTE — Telephone Encounter (Signed)
I called, appointment made for 1pm tomorrow afternoon. ?

## 2021-07-05 ENCOUNTER — Encounter: Payer: Self-pay | Admitting: Orthopaedic Surgery

## 2021-07-05 ENCOUNTER — Ambulatory Visit (INDEPENDENT_AMBULATORY_CARE_PROVIDER_SITE_OTHER): Payer: Medicare Other | Admitting: Orthopaedic Surgery

## 2021-07-05 VITALS — BP 127/79 | HR 116 | Ht 67.0 in | Wt 165.0 lb

## 2021-07-05 DIAGNOSIS — M5126 Other intervertebral disc displacement, lumbar region: Secondary | ICD-10-CM | POA: Diagnosis not present

## 2021-07-05 MED ORDER — GABAPENTIN 300 MG PO CAPS: 300.0000 mg | ORAL_CAPSULE | Freq: Three times a day (TID) | ORAL | 1 refills | Status: DC

## 2021-07-05 NOTE — Addendum Note (Signed)
Addended by: Meyer Cory on: 07/05/2021 02:02 PM ? ? Modules accepted: Orders ? ?

## 2021-07-05 NOTE — Progress Notes (Signed)
? ?Office Visit Note ?  ?Patient: Jeffrey Rubio           ?Date of Birth: 1945-04-05           ?MRN: [id number] ?Visit Date: 07/05/2021 ?             ?Requested by: Worthy Rancher, MD ?[address] ?[place],  [place] [address] ?PCP: Dettinger, Fransisca Kaufmann, MD ? ? ?Assessment & Plan: ?Visit Diagnoses:  ?1. Protrusion of lumbar intervertebral disc   ? ? ?Plan: Patient's been through exercise program without relief.  Epidural, prednisone Dosepak, pain medication.  We will start some Neurontin and proceed with lumbar myelogram CT scan to see if there is a dynamic component with disc extrusion causing severe nerve compression with associated severe spasms when she rates a 10 out of 10.  Reviewed the MRI scan as well as lumbar CT scan done 05/19/2021. ? ?Follow-Up Instructions: No follow-ups on file.  ? ?Orders:  ?No orders of the defined types were placed in this encounter. ? ?Meds ordered this encounter  ?Medications  ? gabapentin (NEURONTIN) 300 MG capsule  ?  Sig: Take 1 capsule (300 mg total) by mouth 3 (three) times daily.  ?  Dispense:  90 capsule  ?  Refill:  1  ? ? ? ? Procedures: ?No procedures performed ? ? ?Clinical Data: ?No additional findings. ? ? ?Subjective: ?Chief Complaint  ?Patient presents with  ? Lower Back - Pain  ? ? ?HPI 76 year old male returns with ongoing severe back pain with spasms and inability to get upright.  He states after the epidural for 2 or 3 days he was 50% improved as far as getting more upright but then his back walking with head in a flexed position even with his bellybutton.  States he can only walk 50 feet has to stop and sit down.  He has been at times and pain he rates it 10 out of 10 and writing.  1 time he got some relief on his side legs figure-of-four cross 1 leg over the side of the sofa.  MRI scan showed suggestion of the right foraminal disc protrusion at the L3-4 level on the right.  In the past he had had an injection on the left where he had L4-5 disc protrusion and  he got relief with this but none when he had the injection on the right side.  Patient is here with his wife he states his pain is severe.  Past history of nephrectomy for kidney stone problems.  MRI scan shows no postop abnormality in this area.  Patient does have diabetes.  He has had prednisone pack not effective.  Epidural on the right was on 06/22/2021.  Patient describes severe problems back 20 years ago when he was in back spasm similar to his current problem he was doing a lot of lifting had to change jobs.  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  No associated bowel or bladder symptoms. ? ?Patient had flexion-extension x-rays lumbar which showed no spondylolisthesis no instability. ? ?Review of Systems negative for chills or fever.  All the systems noncontributory HPI. ? ? ?Objective: ?Vital Signs: BP 127/79   Pulse (!) 116   Ht '5\' 7"'$  (1.702 m)   Wt 165 lb (74.8 kg)   BMI 25.84 kg/m?  ? ?Physical Exam ?Constitutional:   ?  Appearance: He is well-developed.  ?HENT:  ?   Head: Normocephalic and atraumatic.  ?   Right Ear: External ear normal.  ?   Left Ear: External ear normal.  ?Eyes:  ?   Pupils: Pupils are equal, round, and reactive to light.  ?Neck:  ?   Thyroid: No thyromegaly.  ?   Trachea: No tracheal deviation.  ?Cardiovascular:  ?   Rate and Rhythm: Normal rate.  ?Pulmonary:  ?   Effort: Pulmonary effort is normal.  ?   Breath sounds: No wheezing.  ?Abdominal:  ?   General: Bowel sounds are normal.  ?   Palpations: Abdomen is soft.  ?Musculoskeletal:  ?   Cervical back: Neck supple.  ?Skin: ?   General: Skin is warm and dry.  ?   Capillary Refill: Capillary refill takes less than 2 seconds.  ?Neurological:  ?   Mental Status: He is alert and oriented to person, place, and time.  ?Psychiatric:     ?   Behavior: Behavior normal.     ?   Thought Content: Thought content normal.      ?   Judgment: Judgment normal.  ? ? ?Ortho Exam negative straight leg raising right left no pain with internal/external rotation good hip resistive testing right and left no hip flexion weakness.  Quads are strong right and left.  No abductor weakness.  No synovitis upper or lower extremities hands knees elbows.  Has some sciatic notch tenderness on the right some pain with hip extension on the right negative on the left.  Negative FABER test right and left. ? ?Specialty Comments:  ?MRI LUMBAR SPINE WITHOUT CONTRAST  ? ?TECHNIQUE:  ?Multiplanar, multisequence MR imaging of the lumbar spine was  ?performed. No intravenous contrast was administered.  ? ?COMPARISON:  Radiographs October 13, 2020  ? ?FINDINGS:  ?Segmentation:  Standard.  ? ?Alignment:  Small retrolisthesis of L2 over L3.  ? ?Vertebrae:  No fracture, evidence of discitis, or bone lesion.  ? ?Conus medullaris and cauda equina: Conus extends to the L1 level.  ?Conus and cauda equina appear normal.  ? ?Paraspinal and other soft tissues: Mild nodular thickening of the  ?left adrenal gland, measuring up to 9 mm, similar to prior CT  ?performed in 2015. Absent left kidney.  ? ?Disc levels:  ? ?T12-L1: Shallow disc bulge. No spinal canal or neural foraminal  ?stenosis.  ? ?L1-2: Mild facet degenerative changes. No spinal canal or neural  ?foraminal stenosis.  ? ?L2-3: Disc bulge with superimposed left central/foraminal disc  ?protrusion and mild facet degenerative changes resulting in  ?narrowing of the left subarticular zone, mild right and moderate  ?left neural foraminal narrowing.  ? ?L3-4: Shallow disc bulge and mild facet degenerative changes without  ?significant spinal canal or neural foraminal stenosis.  ? ?L4-5: Shallow disc bulge and mild facet degenerative changes  ?resulting in mild bilateral neural foraminal narrowing.  ? ?L5-S1: Moderate facet degenerative changes. No spinal canal or  ?neural foraminal stenosis.  ? ?IMPRESSION:  ?Degenerative  changes of the lumbar spine, more pronounced at L2-3  ?where a left central/foraminal disc protrusion contributes for  ?narrowing of the left subarticular zone and moderate left neural  ?foraminal narrowing. There is also mild right neural foraminal  ?narrowing at this level.  ? ? ?Electronically Signed  ?  By: Pedro Earls M.D.  ?  On: 11/03/2020 14:35 ? ?Imaging: ?No results found. ? ? ?PMFS History: ?Patient Active Problem List  ?  Diagnosis Date Noted  ? Protrusion of lumbar intervertebral disc 11/04/2020  ? Pain management contract signed 09/12/2018  ? Overweight (BMI 25.0-29.9) 12/07/2017  ? Abnormal cardiovascular stress test 01/11/2017  ? Urgency incontinence 12/28/2015  ? Restless leg syndrome 01/01/2015  ? Benign prostatic hyperplasia   ? Irritable bowel syndrome 04/15/2013  ? 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  ? Hypertension associated with diabetes ([place]) 10/22/2012  ? Hyperlipidemia associated with type 2 diabetes mellitus ([place]) 10/22/2012  ? DM (diabetes mellitus) ([place]) 10/22/2012  ? ?Past Medical History:  ?Diagnosis Date  ? Asthmatic bronchitis   ? BPH (benign prostatic hypertrophy)   ? Cataracts, bilateral   ? Colon polyps   ? Diabetes mellitus   ? Diverticulosis   ? Fibromyalgia   ? GERD (gastroesophageal reflux disease)   ? Gout   ? History of nephrectomy, unilateral   ? left   ? Hyperlipidemia   ? Hypertension   ? Single kidney   ?  ?Family History  ?Problem Relation Age of Onset  ? Coronary artery disease Other   ?     family history   ? Hypertension Other   ?     family history   ? Alzheimer's disease Mother 77  ? Cancer Father   ?     prostate  ? Stroke Father   ? CAD Father 56  ?     CABG  ? Hypertension Sister   ? Hypercalcemia Brother   ? Hypertension Brother   ? Kidney Stones Son   ? Breast cancer Maternal Grandmother   ? Diabetes Paternal Grandmother   ? Other Paternal Grandmother   ?     Crite's disease  ? Suicidality Paternal Grandfather   ?  ?Past Surgical History:   ?Procedure Laterality Date  ? APPENDECTOMY    ? CHOLECYSTECTOMY    ? COLONOSCOPY W/ POLYPECTOMY    ? EYE SURGERY    ? catarat - right   ? LEFT HEART CATH AND CORONARY ANGIOGRAPHY N/A 01/11/2017  ? Procedur

## 2021-07-07 ENCOUNTER — Encounter: Payer: Self-pay | Admitting: Family Medicine

## 2021-07-07 ENCOUNTER — Ambulatory Visit (INDEPENDENT_AMBULATORY_CARE_PROVIDER_SITE_OTHER): Payer: Medicare Other | Admitting: Family Medicine

## 2021-07-07 VITALS — BP 125/76 | HR 87 | Ht 67.0 in | Wt 168.0 lb

## 2021-07-07 DIAGNOSIS — K219 Gastro-esophageal reflux disease without esophagitis: Secondary | ICD-10-CM

## 2021-07-07 DIAGNOSIS — R3912 Poor urinary stream: Secondary | ICD-10-CM

## 2021-07-07 DIAGNOSIS — E785 Hyperlipidemia, unspecified: Secondary | ICD-10-CM

## 2021-07-07 DIAGNOSIS — E1159 Type 2 diabetes mellitus with other circulatory complications: Secondary | ICD-10-CM

## 2021-07-07 DIAGNOSIS — I152 Hypertension secondary to endocrine disorders: Secondary | ICD-10-CM | POA: Diagnosis not present

## 2021-07-07 DIAGNOSIS — N401 Enlarged prostate with lower urinary tract symptoms: Secondary | ICD-10-CM | POA: Diagnosis not present

## 2021-07-07 DIAGNOSIS — E1169 Type 2 diabetes mellitus with other specified complication: Secondary | ICD-10-CM | POA: Diagnosis not present

## 2021-07-07 DIAGNOSIS — E119 Type 2 diabetes mellitus without complications: Secondary | ICD-10-CM | POA: Diagnosis not present

## 2021-07-07 LAB — BAYER DCA HB A1C WAIVED: HB A1C (BAYER DCA - WAIVED): 7.8 % — ABNORMAL HIGH (ref 4.8–5.6)

## 2021-07-07 MED ORDER — OZEMPIC (0.25 OR 0.5 MG/DOSE) 2 MG/1.5ML ~~LOC~~ SOPN: 0.5000 mg | PEN_INJECTOR | SUBCUTANEOUS | 3 refills | Status: DC

## 2021-07-07 MED ORDER — FAMOTIDINE 20 MG PO TABS: 20.0000 mg | ORAL_TABLET | Freq: Two times a day (BID) | ORAL | 3 refills | Status: DC

## 2021-07-07 MED ORDER — AMLODIPINE BESYLATE 5 MG PO TABS: 5.0000 mg | ORAL_TABLET | Freq: Every day | ORAL | 3 refills | Status: DC

## 2021-07-07 MED ORDER — TRUEPLUS LANCETS 28G MISC: 3 refills | Status: DC

## 2021-07-07 MED ORDER — PRAVASTATIN SODIUM 20 MG PO TABS: 20.0000 mg | ORAL_TABLET | Freq: Every day | ORAL | 3 refills | Status: DC

## 2021-07-07 MED ORDER — TAMSULOSIN HCL 0.4 MG PO CAPS: 0.4000 mg | ORAL_CAPSULE | Freq: Every day | ORAL | 3 refills | Status: DC

## 2021-07-07 NOTE — Progress Notes (Signed)
? ?BP 125/76   Pulse 87   Ht '5\' 7"'  (1.702 m)   Wt 168 lb (76.2 kg)   SpO2 95%   BMI 26.31 kg/m?   ? ?Subjective:  ? ?Patient ID: Jeffrey Rubio, male    DOB: [birth year], 76 y.o.   MRN: [id number] ? ?HPI: ?Jeffrey Rubio is a 76 y.o. male presenting on 07/07/2021 for Medical Management of Chronic Issues, Diabetes, and Back Pain (Radiates down right leg. Sees Rodell Perna Myelogram scheduled. Wife request PSA and kidney function to be checked today.) ? ? ?HPI ?Type 2 diabetes mellitus ?Patient comes in today for recheck of his diabetes. Patient has been currently taking metformin and Ozempic. Patient is currently on an ACE inhibitor/ARB. Patient has not seen an ophthalmologist this year. Patient denies any issues with their feet. The symptom started onset as an adult hypertension and hyperlipidemia ARE RELATED TO DM  ? ?Hyperlipidemia ?Patient is coming in for recheck of his hyperlipidemia. The patient is currently taking pravastatin. They deny any issues with myalgias or history of liver damage from it. They deny any focal numbness or weakness or chest pain.  ? ?Hypertension ?Patient is currently on amlodipine and irbesartan, and their blood pressure today is 125/76. Patient denies any lightheadedness or dizziness. Patient denies headaches, blurred vision, chest pains, shortness of breath, or weakness. Denies any side effects from medication and is content with current medication.  ? ?BPH ?Patient is coming in for recheck on BPH ?Symptoms: Weak urinary stream and frequency ?Medication: None currently ?Last PSA: 2 years ago, normal ? ?Patient continues to see Dr. Inda Merlin for back pain and does not feel like they are figuring it out. ? ?Relevant past medical, surgical, family and social history reviewed and updated as indicated. Interim medical history since our last visit reviewed. ?Allergies and medications reviewed and updated. ? ?Review of Systems  ?Constitutional:  Negative for chills and fever.  ?Eyes:   Negative for visual disturbance.  ?Respiratory:  Negative for shortness of breath and wheezing.   ?Cardiovascular:  Negative for chest pain and leg swelling.  ?Musculoskeletal:  Positive for arthralgias, back pain and gait problem.  ?Skin:  Negative for rash.  ?Neurological:  Positive for weakness. Negative for dizziness and light-headedness.  ?Psychiatric/Behavioral:  Negative for dysphoric mood. The patient is not nervous/anxious.   ?All other systems reviewed and are negative. ? ?Per HPI unless specifically indicated above ? ? ?Allergies as of 07/07/2021   ? ?   Reactions  ? Hydrocodone-acetaminophen   ? Hydroxyzine Hcl   ? Tylenol With Codeine #3 [acetaminophen-codeine]   ? ?  ? ?  ?Medication List  ?  ? ?  ? Accurate as of July 07, 2021  9:57 AM. If you have any questions, ask your nurse or doctor.  ?  ?  ? ?  ? ?Accu-Chek Aviva Plus test strip ?Generic drug: glucose blood ?TEST BLOOD SUGAR DAILY DX E11.69 ?  ?Accu-Chek Aviva Plus w/Device Kit ?USE to check blood sugars daily ?  ?acetaminophen-codeine 300-30 MG tablet ?Commonly known as: TYLENOL #3 ?Take 1-2 tablets by mouth every 4 (four) hours as needed for moderate pain. ?  ?amLODipine 5 MG tablet ?Commonly known as: NORVASC ?Take 1 tablet (5 mg total) by mouth daily. ?  ?aspirin 81 MG tablet ?Take 81 mg by mouth daily. ?  ?doxepin 50 MG capsule ?Commonly known as: SINEQUAN ?Take 1 capsule (50 mg total) by mouth at bedtime as needed. ?  ?famotidine 20 MG tablet ?  Commonly known as: PEPCID ?Take 1 tablet (20 mg total) by mouth 2 (two) times daily. ?  ?gabapentin 300 MG capsule ?Commonly known as: NEURONTIN ?Take 1 capsule (300 mg total) by mouth 3 (three) times daily. ?  ?irbesartan 300 MG tablet ?Commonly known as: AVAPRO ?Take 1 tablet (300 mg total) by mouth daily. ?  ?magnesium gluconate 500 MG tablet ?Commonly known as: MAGONATE ?Take 500 mg by mouth every other day. ?  ?meclizine 25 MG tablet ?Commonly known as: ANTIVERT ?Take 1 tablet (25 mg total) by  mouth 3 (three) times daily as needed for dizziness. ?  ?Metamucil 28.3 % Powd ?Generic drug: Psyllium ?  ?metFORMIN 500 MG tablet ?Commonly known as: GLUCOPHAGE ?Take 1 tablet (500 mg total) by mouth 2 (two) times daily with a meal. ?  ?Ozempic (0.25 or 0.5 MG/DOSE) 2 MG/1.5ML Sopn ?Generic drug: Semaglutide(0.25 or 0.5MG/DOS) ?Inject 0.5 mg into the skin once a week. ?  ?pravastatin 20 MG tablet ?Commonly known as: PRAVACHOL ?Take 1 tablet (20 mg total) by mouth daily. ?  ?PROBIOTIC-10 PO ?Take by mouth daily. ?  ?tamsulosin 0.4 MG Caps capsule ?Commonly known as: FLOMAX ?Take 1 capsule (0.4 mg total) by mouth daily. ?Started by: Worthy Rancher, MD ?  ?TRUEplus Lancets 28G Misc ?Use to check blood sugar once daily and PRN Dx. E11.9 ?  ?vitamin B-12 1000 MCG tablet ?Commonly known as: CYANOCOBALAMIN ?Take 1,000 mcg by mouth every other day. ?  ?Vitamin D3 50 MCG (2000 UT) Tabs ?Take 4,000 Units by mouth every morning. ?  ?zinc gluconate 50 MG tablet ?Take 50 mg by mouth daily. ?  ? ?  ? ? ? ?Objective:  ? ?BP 125/76   Pulse 87   Ht '5\' 7"'  (1.702 m)   Wt 168 lb (76.2 kg)   SpO2 95%   BMI 26.31 kg/m?   ?Wt Readings from Last 3 Encounters:  ?07/07/21 168 lb (76.2 kg)  ?07/05/21 165 lb (74.8 kg)  ?06/28/21 171 lb (77.6 kg)  ?  ?Physical Exam ?Vitals and nursing note reviewed.  ?Constitutional:   ?   General: He is not in acute distress. ?   Appearance: He is well-developed. He is not diaphoretic.  ?Eyes:  ?   General: No scleral icterus. ?   Conjunctiva/sclera: Conjunctivae normal.  ?Neck:  ?   Thyroid: No thyromegaly.  ?Cardiovascular:  ?   Rate and Rhythm: Normal rate and regular rhythm.  ?   Heart sounds: Normal heart sounds. No murmur heard. ?Pulmonary:  ?   Effort: Pulmonary effort is normal. No respiratory distress.  ?   Breath sounds: Normal breath sounds. No wheezing.  ?Musculoskeletal:     ?   General: No swelling. Normal range of motion.  ?   Cervical back: Neck supple.  ?   Lumbar back: Tenderness  present. No swelling, edema or bony tenderness.  ?Lymphadenopathy:  ?   Cervical: No cervical adenopathy.  ?Skin: ?   General: Skin is warm and dry.  ?   Findings: No rash.  ?Neurological:  ?   Mental Status: He is alert and oriented to person, place, and time.  ?   Coordination: Coordination normal.  ?Psychiatric:     ?   Behavior: Behavior normal.  ? ? ? ? ?Assessment & Plan:  ? ?Problem List Items Addressed This Visit   ? ?  ? Cardiovascular and Mediastinum  ? Hypertension associated with diabetes ([place])  ? Relevant Medications  ? amLODipine (NORVASC) 5  MG tablet  ? pravastatin (PRAVACHOL) 20 MG tablet  ? Semaglutide,0.25 or 0.5MG/DOS, (OZEMPIC, 0.25 OR 0.5 MG/DOSE,) 2 MG/1.5ML SOPN  ?  ? Endocrine  ? Hyperlipidemia associated with type 2 diabetes mellitus ([place])  ? Relevant Medications  ? amLODipine (NORVASC) 5 MG tablet  ? pravastatin (PRAVACHOL) 20 MG tablet  ? Semaglutide,0.25 or 0.5MG/DOS, (OZEMPIC, 0.25 OR 0.5 MG/DOSE,) 2 MG/1.5ML SOPN  ? DM (diabetes mellitus) ([place]) - Primary  ? Relevant Medications  ? pravastatin (PRAVACHOL) 20 MG tablet  ? Semaglutide,0.25 or 0.5MG/DOS, (OZEMPIC, 0.25 OR 0.5 MG/DOSE,) 2 MG/1.5ML SOPN  ? TRUEplus Lancets 28G MISC  ? Other Relevant Orders  ? BMP8+EGFR  ?  ? Genitourinary  ? Benign prostatic hyperplasia  ? Relevant Medications  ? tamsulosin (FLOMAX) 0.4 MG CAPS capsule  ? Other Relevant Orders  ? PSA, total and free  ? ?Other Visit Diagnoses   ? ? Gastroesophageal reflux disease without esophagitis      ? Relevant Medications  ? famotidine (PEPCID) 20 MG tablet  ? Other Relevant Orders  ? Bayer DCA Hb A1c Waived  ? ?  ?  ?Continue current medicine, continue follow-up with Dr. Lorin Mercy.  We will try Flomax for his urinary frequency to see if it helps.  Check PSA today. ? ? ?Follow up plan: ?Return in about 3 months (around 10/06/2021), or if symptoms worsen or fail to improve, for Diabetes hypertension and cholesterol recheck. ? ?Counseling provided for all of the vaccine  components ?Orders Placed This Encounter  ?Procedures  ? Bayer DCA Hb A1c Waived  ? PSA, total and free  ? BMP8+EGFR  ? ? ?Caryl Pina, MD ?[place] ?07/07/2021, 9:57 AM ? ? ? ? ?

## 2021-07-08 LAB — BMP8+EGFR
BUN/Creatinine Ratio: 21 (ref 10–24)
BUN: 30 mg/dL — ABNORMAL HIGH (ref 8–27)
CO2: 24 mmol/L (ref 20–29)
Calcium: 9.6 mg/dL (ref 8.6–10.2)
Chloride: 102 mmol/L (ref 96–106)
Creatinine, Ser: 1.42 mg/dL — ABNORMAL HIGH (ref 0.76–1.27)
Glucose: 164 mg/dL — ABNORMAL HIGH (ref 70–99)
Potassium: 5 mmol/L (ref 3.5–5.2)
Sodium: 142 mmol/L (ref 134–144)
eGFR: 52 mL/min/{1.73_m2} — ABNORMAL LOW (ref >59)

## 2021-07-08 LAB — PSA, TOTAL AND FREE
PSA, Free Pct: 20 %
PSA, Free: 0.1 ng/mL
Prostate Specific Ag, Serum: 0.5 ng/mL (ref 0.0–4.0)

## 2021-07-12 ENCOUNTER — Ambulatory Visit
Admission: RE | Admit: 2021-07-12 | Discharge: 2021-07-12 | Disposition: A | Discharge status: Home / Self Care | Payer: Medicare Other | Source: Ambulatory Visit | Attending: Orthopaedic Surgery | Admitting: Orthopaedic Surgery

## 2021-07-12 DIAGNOSIS — M5126 Other intervertebral disc displacement, lumbar region: Secondary | ICD-10-CM

## 2021-07-12 DIAGNOSIS — M4316 Spondylolisthesis, lumbar region: Secondary | ICD-10-CM | POA: Diagnosis not present

## 2021-07-12 DIAGNOSIS — M5136 Other intervertebral disc degeneration, lumbar region: Secondary | ICD-10-CM | POA: Diagnosis not present

## 2021-07-12 MED ORDER — DIAZEPAM 5 MG PO TABS
5.0000 mg | ORAL_TABLET | Freq: Once | ORAL | Status: AC
  Administered 2021-07-12: 5 mg via ORAL

## 2021-07-12 MED ORDER — IOPAMIDOL (ISOVUE-M 200) INJECTION 41%
20.0000 mL | Freq: Once | INTRAMUSCULAR | Status: AC
  Administered 2021-07-12: 20 mL via INTRATHECAL

## 2021-07-12 MED ORDER — ONDANSETRON HCL 4 MG/2ML IJ SOLN: 4.0000 mg | Freq: Once | INTRAMUSCULAR | Status: DC | PRN

## 2021-07-12 MED ORDER — MEPERIDINE HCL 50 MG/ML IJ SOLN: 50.0000 mg | Freq: Once | INTRAMUSCULAR | Status: DC | PRN

## 2021-07-12 NOTE — Discharge Instructions (Signed)

## 2021-07-19 ENCOUNTER — Ambulatory Visit (INDEPENDENT_AMBULATORY_CARE_PROVIDER_SITE_OTHER): Payer: Medicare Other | Admitting: Orthopaedic Surgery

## 2021-07-19 ENCOUNTER — Encounter: Payer: Self-pay | Admitting: Orthopaedic Surgery

## 2021-07-19 VITALS — BP 121/79 | HR 106 | Ht 67.0 in | Wt 168.0 lb

## 2021-07-19 DIAGNOSIS — M5126 Other intervertebral disc displacement, lumbar region: Secondary | ICD-10-CM | POA: Diagnosis not present

## 2021-07-19 NOTE — Progress Notes (Signed)
? ?Office Visit Note ?  ?Patient: Jeffrey Rubio           ?Date of Birth: Apr 05, 1945           ?MRN: [id number] ?Visit Date: 07/19/2021 ?             ?Requested by: Worthy Rancher, MD ?[address] ?[place],  [place] [address] ?PCP: Dettinger, Fransisca Kaufmann, MD ? ? ?Assessment & Plan: ?Visit Diagnoses:  ?1. Protrusion of lumbar intervertebral disc   ?         Right L3-4 ? ?Plan: Reviewed the myelogram CT scan.  He has foraminal disc protrusion L3-4 compressing the exiting L3 nerve root.  Patient states is unable to handle the amount of pain and previous anti-inflammatories, gabapentin, narcotic medication, prednisone Dosepak, epidural steroid injections have been ineffective.  Plan to be microdiscectomy for foraminal disc protrusion at L3-4 on the right.  Patient has extruded disc cephalad from the L3-4 disc level causing compression.  Procedure discussed risk surgery discussed in detail questions were elicited and answered he understands request to proceed.  Risk of repeat disc herniation dural tear reoperation, instability and need for potential fusion at some point in future discussed and reviewed in detail.  Patient's wife was present for the discussion. ? ?Follow-Up Instructions: No follow-ups on file.  ? ?Orders:  ?No orders of the defined types were placed in this encounter. ? ?No orders of the defined types were placed in this encounter. ? ? ? ? Procedures: ?No procedures performed ? ? ?Clinical Data: ?No additional findings. ? ? ?Subjective: ?Chief Complaint  ?Patient presents with  ? Lower Back - Follow-up  ?  Lumbar CT/Myelo review  ? ? ?HPI 76 year old male returns ongoing severe back pain difficulty standing upright.  He states he has had pain many nights where he is not able to sleep more than an hour keeps waking up.  He gets some relief if he figure-of-four's and leans to 1 side.  MRI scan showed lateral disc protrusion at L3-4 and ongoing severe pain myelogram CT scan has been obtained.  Patient  states his pain is a 9 or 10 out of 10 at times.  He has been on Tylenol 3, gabapentin, prednisone Dosepaks, epidural steroid injections without relief. ? ?Review of Systems past history of a nephrectomy.  He has hypertension hyperlipidemia and diabetes. ? ? ?Objective: ?Vital Signs: BP 121/79   Pulse (!) 106   Ht '5\' 7"'$  (1.702 m)   Wt 168 lb (76.2 kg)   BMI 26.31 kg/m?  ? ?Physical Exam ?Constitutional:   ?   Appearance: He is well-developed.  ?   Comments: Patient pain not able to get upright and walks with hips flexed 30 degrees.  Difficulty getting from sitting to standing with complaints of right hip and right leg thigh pain.  ?HENT:  ?   Head: Normocephalic and atraumatic.  ?   Right Ear: External ear normal.  ?   Left Ear: External ear normal.  ?Eyes:  ?   Pupils: Pupils are equal, round, and reactive to light.  ?Neck:  ?   Thyroid: No thyromegaly.  ?   Trachea: No tracheal deviation.  ?Cardiovascular:  ?   Rate and Rhythm: Normal rate.  ?Pulmonary:  ?   Effort: Pulmonary effort is normal.  ?   Breath sounds: No wheezing.  ?Abdominal:  ?   General: Bowel sounds are normal.  ?   Palpations: Abdomen is soft.  ?Musculoskeletal:  ?  Cervical back: Neck supple.  ?Skin: ?   General: Skin is warm and dry.  ?   Capillary Refill: Capillary refill takes less than 2 seconds.  ?Neurological:  ?   Mental Status: He is alert and oriented to person, place, and time.  ?Psychiatric:     ?   Behavior: Behavior normal.     ?   Thought Content: Thought content normal.     ?   Judgment: Judgment normal.  ? ? ?Ortho Exam negative straight leg raising right and left.  No pain with hip internal/external rotation.  Good quad strength right and left,adductor's strong both sides.  Pain with hip extension on the right negative on the left.  Negative FABER test right and left.  Distal pulses are palpable. ? ?Specialty Comments:  ?MRI LUMBAR SPINE WITHOUT CONTRAST  ? ?TECHNIQUE:  ?Multiplanar, multisequence MR imaging of the lumbar  spine was  ?performed. No intravenous contrast was administered.  ? ?COMPARISON:  Radiographs October 13, 2020  ? ?FINDINGS:  ?Segmentation:  Standard.  ? ?Alignment:  Small retrolisthesis of L2 over L3.  ? ?Vertebrae:  No fracture, evidence of discitis, or bone lesion.  ? ?Conus medullaris and cauda equina: Conus extends to the L1 level.  ?Conus and cauda equina appear normal.  ? ?Paraspinal and other soft tissues: Mild nodular thickening of the  ?left adrenal gland, measuring up to 9 mm, similar to prior CT  ?performed in 2015. Absent left kidney.  ? ?Disc levels:  ? ?T12-L1: Shallow disc bulge. No spinal canal or neural foraminal  ?stenosis.  ? ?L1-2: Mild facet degenerative changes. No spinal canal or neural  ?foraminal stenosis.  ? ?L2-3: Disc bulge with superimposed left central/foraminal disc  ?protrusion and mild facet degenerative changes resulting in  ?narrowing of the left subarticular zone, mild right and moderate  ?left neural foraminal narrowing.  ? ?L3-4: Shallow disc bulge and mild facet degenerative changes without  ?significant spinal canal or neural foraminal stenosis.  ? ?L4-5: Shallow disc bulge and mild facet degenerative changes  ?resulting in mild bilateral neural foraminal narrowing.  ? ?L5-S1: Moderate facet degenerative changes. No spinal canal or  ?neural foraminal stenosis.  ? ?IMPRESSION:  ?Degenerative changes of the lumbar spine, more pronounced at L2-3  ?where a left central/foraminal disc protrusion contributes for  ?narrowing of the left subarticular zone and moderate left neural  ?foraminal narrowing. There is also mild right neural foraminal  ?narrowing at this level.  ? ? ?Electronically Signed  ?  By: Pedro Earls M.D.  ?  On: 11/03/2020 14:35 ? ?Imaging: ?Narrative & Impression  ?CLINICAL DATA:  Low back pain and right leg pain ?  ?EXAM: ?CT MYELOGRAPHY LUMBAR SPINE ?  ?TECHNIQUE: ?CT imaging of the lumbar spine was performed after Isovue 200M ?contrast  administration. Multiplanar CT image reconstructions were ?also generated. ?  ?RADIATION DOSE REDUCTION: This exam was performed according to the ?departmental dose-optimization program which includes automated ?exposure control, adjustment of the mA and/or kV according to ?patient size and/or use of iterative reconstruction technique. ?  ?COMPARISON:  Lumbar spine MRI 06/01/2021 lumbar spine CT 05/19/2021 ?  ?FINDINGS: ?The myelogram procedure is reported separately by the performing ?physician. ?  ?Segmentation: Standard; the lowest formed disc space is designated ?L5-S1. ?  ?Alignment: There is trace retrolisthesis of L2 on L3, unchanged. ?There is slight levocurvature centered at L3. ?  ?Vertebrae: Vertebral body heights are preserved. There is no ?evidence of acute injury. There is  no suspicious osseous lesion. ?Small Schmorl's nodes are again seen along the inferior L2 and ?superior L3 endplates. ?  ?Conus medullaris: Extends to the L1-L2 level and appears normal. ?  ?Paraspinal and other soft tissues: There is mild perinephric ?stranding on the right, similar to the prior study. The left kidney ?is surgically absent. The paraspinal soft tissues are unremarkable. ?There is mild calcified atherosclerotic plaque in the abdominal ?aorta. ?  ?Disc levels: ?  ?There is mild disc space narrowing at L2-L3 through L4-L5 ?  ?T12-L1: No significant spinal canal or neural foraminal stenosis ?  ?L1-L2: No significant spinal canal or neural foraminal stenosis ?  ?L2-L3: There is a mild disc bulge eccentric to the left contributing ?to mild left neural foraminal stenosis. There is no significant ?spinal canal or right neural foraminal stenosis. Findings are ?unchanged compared to the recent MRI. ?  ?L3-L4: There is a mild diffuse disc bulge. There is a suspected ?right foraminal disc extrusion with probable impingement of the ?exiting right L3 nerve root (9-35, 3-55). This is likely not ?significantly changed compared to  the prior MRI. There is no ?significant spinal canal or left neural foraminal stenosis ?  ?L4-L5: There is a mild disc bulge and mild bilateral facet ?arthropathy resulting in mild right and no significant left neural ?fo

## 2021-07-22 ENCOUNTER — Other Ambulatory Visit: Payer: Self-pay

## 2021-07-26 DIAGNOSIS — E113211 Type 2 diabetes mellitus with mild nonproliferative diabetic retinopathy with macular edema, right eye: Secondary | ICD-10-CM | POA: Diagnosis not present

## 2021-07-26 LAB — HM DIABETES EYE EXAM

## 2021-07-27 NOTE — Progress Notes (Signed)
Surgical Instructions ? ? ? Your procedure is scheduled on Friday, April 28th, 2023. ? ? Report to [facility] Main Entrance "A" at 05:30 A.M., then check in with the Admitting office. ? Call this number if you have problems the morning of surgery: ? [phone] ? ? If you have any questions prior to your surgery date call [phone]: Open Monday-Friday 8am-4pm ? ? ? Remember: ? Do not eat after midnight the night before your surgery ? ?You may drink clear liquids until 04:30 the morning of your surgery.   ?Clear liquids allowed are: Water, Non-Citrus Juices (without pulp), Carbonated Beverages, Clear Tea, Black Coffee ONLY (NO MILK, CREAM OR POWDERED CREAMER of any kind), and Gatorade ? ?Patient Instructions ? ?The day of surgery (if you have diabetes): ?Drink ONE (1) 12 oz G2 given to you in your pre admission testing appointment by 04:30 the morning of surgery. Drink in one sitting. Do not sip.  ?This drink was given to you during your hospital  ?pre-op appointment visit.  ?Nothing else to drink after completing the  ?12 oz bottle of G2. ? ?       If you have questions, please contact your surgeon?s office.  ?  ? Take these medicines the morning of surgery with A SIP OF WATER:  ? ?amLODipine (NORVASC)  ?famotidine (PEPCID)  ?pravastatin (PRAVACHOL) ?tamsulosin (FLOMAX) ? ?If needed: ? ?acetaminophen-codeine (TYLENOL #3) ?meclizine (ANTIVERT)  ? ?Follow your surgeon's instructions on when to stop Aspirin.  If no instructions were given by your surgeon then you will need to call the office to get those instructions.    ? ?As of today, STOP taking any Aspirin (unless otherwise instructed by your surgeon) Aleve, Naproxen, Ibuprofen, Motrin, Advil, Goody's, BC's, all herbal medications, fish oil, and all vitamins. ? ? ?WHAT DO I DO ABOUT MY DIABETES MEDICATION? ? ? ?Do not take metFORMIN (GLUCOPHAGE) and OZEMPIC the morning of surgery. ? ? ?HOW TO MANAGE YOUR DIABETES ?BEFORE AND AFTER SURGERY ? ?Why is it important  to control my blood sugar before and after surgery? ?Improving blood sugar levels before and after surgery helps healing and can limit problems. ?A way of improving blood sugar control is eating a healthy diet by: ? Eating less sugar and carbohydrates ? Increasing activity/exercise ? Talking with your doctor about reaching your blood sugar goals ?High blood sugars (greater than 180 mg/dL) can raise your risk of infections and slow your recovery, so you will need to focus on controlling your diabetes during the weeks before surgery. ?Make sure that the doctor who takes care of your diabetes knows about your planned surgery including the date and location. ? ?How do I manage my blood sugar before surgery? ?Check your blood sugar at least 4 times a day, starting 2 days before surgery, to make sure that the level is not too high or low. ? ?Check your blood sugar the morning of your surgery when you wake up and every 2 hours until you get to the Short Stay unit. ? ?If your blood sugar is less than 70 mg/dL, you will need to treat for low blood sugar: ?Do not take insulin. ?Treat a low blood sugar (less than 70 mg/dL) with ? cup of clear juice (cranberry or apple), 4 glucose tablets, OR glucose gel. ?Recheck blood sugar in 15 minutes after treatment (to make sure it is greater than 70 mg/dL). If your blood sugar is not greater than 70 mg/dL on recheck, call [phone] for further instructions. ?  Report your blood sugar to the short stay nurse when you get to Short Stay. ? ?If you are admitted to the hospital after surgery: ?Your blood sugar will be checked by the staff and you will probably be given insulin after surgery (instead of oral diabetes medicines) to make sure you have good blood sugar levels. ?The goal for blood sugar control after surgery is 80-180 mg/dL.  ? ? ? The day of surgery: ?         ?Do not wear jewelry  ?Do not wear lotions, powders, colognes, or deodorant. ?Men may shave face and neck. ?Do not  bring valuables to the hospital. ? ? ?[facility] is not responsible for any belongings or valuables. .  ? ?Do NOT Smoke (Tobacco/Vaping)  24 hours prior to your procedure ? ?If you use a CPAP at night, you may bring your mask for your overnight stay. ?  ?Contacts, glasses, hearing aids, dentures or partials may not be worn into surgery, please bring cases for these belongings ?  ?For patients admitted to the hospital, discharge time will be determined by your treatment team. ?  ?Patients discharged the day of surgery will not be allowed to drive home, and someone needs to stay with them for 24 hours. ? ? ?SURGICAL WAITING ROOM VISITATION ?Patients having surgery or a procedure in a hospital may have two support people. ?Children under the age of 29 must have an adult with them who is not the patient. ?They may stay in the waiting area during the procedure and may switch out with other visitors. If the patient needs to stay at the hospital during part of their recovery, the visitor guidelines for inpatient rooms apply. ? ?Please refer to the [facility] website for the visitor guidelines for Inpatients (after your surgery is over and you are in a regular room).  ? ? ?Special instructions:   ? ?Oral Hygiene is also important to reduce your risk of infection.  Remember - BRUSH YOUR TEETH THE MORNING OF SURGERY WITH YOUR REGULAR TOOTHPASTE ? ? ?[facility]- Preparing For Surgery ? ?Before surgery, you can play an important role. Because skin is not sterile, your skin needs to be as free of germs as possible. You can reduce the number of germs on your skin by washing with CHG (chlorahexidine gluconate) Soap before surgery.  CHG is an antiseptic cleaner which kills germs and bonds with the skin to continue killing germs even after washing.   ? ? ?Please do not use if you have an allergy to CHG or antibacterial soaps. If your skin becomes reddened/irritated stop using the CHG.  ?Do not shave (including legs and underarms)  for at least 48 hours prior to first CHG shower. It is OK to shave your face. ? ?Please follow these instructions carefully. ?  ? ? Shower the NIGHT BEFORE SURGERY and the MORNING OF SURGERY with CHG Soap.  ? If you chose to wash your hair, wash your hair first as usual with your normal shampoo. After you shampoo, rinse your hair and body thoroughly to remove the shampoo.  Then [employer] and genitals (private parts) with your normal soap and rinse thoroughly to remove soap. ? ?After that Use CHG Soap as you would any other liquid soap. You can apply CHG directly to the skin and wash gently with a scrungie or a clean washcloth.  ? ?Apply the CHG Soap to your body ONLY FROM THE NECK DOWN.  Do not use on open  wounds or open sores. Avoid contact with your eyes, ears, mouth and genitals (private parts). Wash Face and genitals (private parts)  with your normal soap.  ? ?Wash thoroughly, paying special attention to the area where your surgery will be performed. ? ?Thoroughly rinse your body with warm water from the neck down. ? ?DO NOT shower/wash with your normal soap after using and rinsing off the CHG Soap. ? ?Pat yourself dry with a CLEAN TOWEL. ? ?Wear CLEAN PAJAMAS to bed the night before surgery ? ?Place CLEAN SHEETS on your bed the night before your surgery ? ?DO NOT SLEEP WITH PETS. ? ? ?Day of Surgery: ? ?Take a shower with CHG soap. ?Wear Clean/Comfortable clothing the morning of surgery ?Do not apply any deodorants/lotions.   ?Remember to brush your teeth WITH YOUR REGULAR TOOTHPASTE. ? ? ? ?If you received a COVID test during your pre-op visit, it is requested that you wear a mask when out in public, stay away from anyone that may not be feeling well, and notify your surgeon if you develop symptoms. If you have been in contact with anyone that has tested positive in the last 10 days, please notify your surgeon. ? ?  ?Please read over the following fact sheets that you were given.   ?

## 2021-07-28 ENCOUNTER — Encounter (HOSPITAL_COMMUNITY): Payer: Self-pay

## 2021-07-28 ENCOUNTER — Ambulatory Visit (INDEPENDENT_AMBULATORY_CARE_PROVIDER_SITE_OTHER): Payer: Medicare Other | Admitting: Surgery

## 2021-07-28 ENCOUNTER — Other Ambulatory Visit: Payer: Self-pay

## 2021-07-28 ENCOUNTER — Ambulatory Visit (HOSPITAL_COMMUNITY)
Admission: RE | Admit: 2021-07-28 | Discharge: 2021-07-28 | Disposition: A | Discharge status: Home / Self Care | Payer: Medicare Other | Source: Ambulatory Visit | Attending: Surgery | Admitting: Surgery

## 2021-07-28 ENCOUNTER — Encounter: Payer: Self-pay | Admitting: Surgery

## 2021-07-28 ENCOUNTER — Encounter (HOSPITAL_COMMUNITY)
Admission: RE | Admit: 2021-07-28 | Discharge: 2021-07-28 | Disposition: A | Discharge status: Home / Self Care | Payer: Medicare Other | Source: Ambulatory Visit | Attending: Orthopaedic Surgery | Admitting: Orthopaedic Surgery

## 2021-07-28 VITALS — BP 137/86 | HR 76 | Ht 67.0 in | Wt 168.0 lb

## 2021-07-28 DIAGNOSIS — Z01818 Encounter for other preprocedural examination: Secondary | ICD-10-CM | POA: Insufficient documentation

## 2021-07-28 DIAGNOSIS — M5416 Radiculopathy, lumbar region: Secondary | ICD-10-CM

## 2021-07-28 LAB — CBC
HCT: 42.4 % (ref 39.0–52.0)
Hemoglobin: 13.5 g/dL (ref 13.0–17.0)
MCH: 29.7 pg (ref 26.0–34.0)
MCHC: 31.8 g/dL (ref 30.0–36.0)
MCV: 93.2 fL (ref 80.0–100.0)
Platelets: 303 10*3/uL (ref 150–400)
RBC: 4.55 MIL/uL (ref 4.22–5.81)
RDW: 14.1 % (ref 11.5–15.5)
WBC: 9.3 10*3/uL (ref 4.0–10.5)
nRBC: 0 % (ref 0.0–0.2)

## 2021-07-28 LAB — SURGICAL PCR SCREEN
MRSA, PCR: NEGATIVE
Staphylococcus aureus: NEGATIVE

## 2021-07-28 LAB — GLUCOSE, CAPILLARY: Glucose-Capillary: 255 mg/dL — ABNORMAL HIGH (ref 70–99)

## 2021-07-28 NOTE — Anesthesia Preprocedure Evaluation (Addendum)
Anesthesia Evaluation  ?Patient identified by MRN, date of birth, ID band ?Patient awake ? ? ? ?Reviewed: ?Allergy & Precautions, NPO status , Patient's Chart, lab work & pertinent test results ? ?Airway ?Mallampati: I ? ?TM Distance: >3 FB ?Neck ROM: Full ? ? ? Dental ?no notable dental hx. ?(+) Teeth Intact, Dental Advisory Given ?  ?Pulmonary ?asthma , former smoker,  ?  ?Pulmonary exam normal ?breath sounds clear to auscultation ? ? ? ? ? ? Cardiovascular ?hypertension, Pt. on medications ?Normal cardiovascular exam ?Rhythm:Regular Rate:Normal ? ?Cath 2018 ?1. Normal coronary anatomy ?2. Normal LV function ?3. Normal LVEDP ?4. 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 ? ?  ?Neuro/Psych ?negative neurological ROS ? negative psych ROS  ? GI/Hepatic ?Neg liver ROS, GERD  ,  ?Endo/Other  ?diabetes, Type 2, Oral Hypoglycemic Agents ? Renal/GU ?negative Renal ROS  ?negative genitourinary ?  ?Musculoskeletal ? ?(+) Fibromyalgia - ? Abdominal ?  ?Peds ? Hematology ?negative hematology ROS ?(+)   ?Anesthesia Other Findings ? ? Reproductive/Obstetrics ? ?  ? ? ? ? ? ? ? ? ? ? ? ? ? ?  ?  ? ? ? ? ? ? ? ?Anesthesia Physical ?Anesthesia Plan ? ?ASA: 2 ? ?Anesthesia Plan: General  ? ?Post-op Pain Management:   ? ?Induction: Intravenous ? ?PONV Risk Score and Plan: 2 and Dexamethasone, Ondansetron and Treatment may vary due to age or medical condition ? ?Airway Management Planned: Oral ETT ? ?Additional Equipment:  ? ?Intra-op Plan:  ? ?Post-operative Plan: Extubation in OR ? ?Informed Consent: I have reviewed the patients History and Physical, chart, labs and discussed the procedure including the risks, benefits and alternatives for the proposed anesthesia with the patient or authorized representative who has indicated his/her understanding and acceptance.  ? ? ? ?Dental advisory given ? ?Plan Discussed with: CRNA ? ?Anesthesia Plan Comments:   ? ? ? ? ? ? ?Anesthesia Quick Evaluation ? ?

## 2021-07-28 NOTE — Progress Notes (Signed)
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  States that symptoms unchanged from previous visit.  He is wanting to proceed with right L3-4 microdiscectomy as scheduled.  Today history and physical performed.  Review of systems negative.  Surgical procedure discussed along with potential rehab/recovery time.  All questions answered. ?

## 2021-07-28 NOTE — Progress Notes (Addendum)
PCP - Vonna Kotyk Dettinger ?Cardiologist - Peter Martinique ?Urologisit: [facility] Mons ? ?PPM/ICD - denies ? ? ?Chest x-ray - 07/28/21 ?EKG - 07/28/21 ?Stress Test - 01/02/17 ?ECHO - denies ?Cardiac Cath - 01/11/17 ? ?Sleep Study - denies ? ? ?Fasting Blood Sugar - 130-140s ?Checks Blood Sugar once a day in the morning ? ?Blood sugar was 255 in PAT appt. [facility] was notified and stated it was okay and to encourage the patient to drink water and limit carbs/sugar today.  ? ?As of today, STOP taking any Aspirin (unless otherwise instructed by your surgeon) Aleve, Naproxen, Ibuprofen, Motrin, Advil, Goody's, BC's, all herbal medications, fish oil, and all vitamins. ? ?ERAS Protcol -yes ?PRE-SURGERY Ensure or G2- g2 given ? ?COVID TEST- not needed ? ? ?Anesthesia review: yes, history of cardiac cath with no follow up since then ? ? ?Patient denies shortness of breath, fever, cough and chest pain at PAT appointment ? ? ?All instructions explained to the patient, with a verbal understanding of the material. Patient agrees to go over the instructions while at home for a better understanding. Patient also instructed to self quarantine after being tested for COVID-19. The opportunity to ask questions was provided. ? ? ?

## 2021-07-29 ENCOUNTER — Other Ambulatory Visit: Payer: Self-pay

## 2021-07-29 ENCOUNTER — Other Ambulatory Visit: Payer: Self-pay | Admitting: Surgery

## 2021-07-29 ENCOUNTER — Ambulatory Visit (HOSPITAL_BASED_OUTPATIENT_CLINIC_OR_DEPARTMENT_OTHER): Payer: Medicare Other | Admitting: Anesthesiology

## 2021-07-29 ENCOUNTER — Other Ambulatory Visit: Payer: Self-pay | Admitting: Family Medicine

## 2021-07-29 ENCOUNTER — Ambulatory Visit (HOSPITAL_COMMUNITY): Payer: Medicare Other

## 2021-07-29 ENCOUNTER — Encounter (HOSPITAL_COMMUNITY): Payer: Self-pay | Admitting: Orthopaedic Surgery

## 2021-07-29 ENCOUNTER — Ambulatory Visit (HOSPITAL_COMMUNITY): Payer: Medicare Other | Admitting: Physician Assistant

## 2021-07-29 ENCOUNTER — Encounter (HOSPITAL_COMMUNITY)
Admission: RE | Disposition: A | Discharge status: Home / Self Care | Payer: Self-pay | Source: Home / Self Care | Attending: Orthopaedic Surgery

## 2021-07-29 ENCOUNTER — Observation Stay (HOSPITAL_COMMUNITY)
Admission: RE | Admit: 2021-07-29 | Discharge: 2021-07-29 | Disposition: A | Discharge status: Home / Self Care | Payer: Medicare Other | Attending: Orthopaedic Surgery | Admitting: Orthopaedic Surgery

## 2021-07-29 DIAGNOSIS — M5126 Other intervertebral disc displacement, lumbar region: Secondary | ICD-10-CM | POA: Diagnosis present

## 2021-07-29 DIAGNOSIS — Z7984 Long term (current) use of oral hypoglycemic drugs: Secondary | ICD-10-CM | POA: Insufficient documentation

## 2021-07-29 DIAGNOSIS — I10 Essential (primary) hypertension: Secondary | ICD-10-CM | POA: Insufficient documentation

## 2021-07-29 DIAGNOSIS — Z8546 Personal history of malignant neoplasm of prostate: Secondary | ICD-10-CM | POA: Insufficient documentation

## 2021-07-29 DIAGNOSIS — J45909 Unspecified asthma, uncomplicated: Secondary | ICD-10-CM | POA: Diagnosis not present

## 2021-07-29 DIAGNOSIS — M5116 Intervertebral disc disorders with radiculopathy, lumbar region: Principal | ICD-10-CM | POA: Insufficient documentation

## 2021-07-29 DIAGNOSIS — E119 Type 2 diabetes mellitus without complications: Secondary | ICD-10-CM | POA: Diagnosis not present

## 2021-07-29 DIAGNOSIS — Z951 Presence of aortocoronary bypass graft: Secondary | ICD-10-CM | POA: Diagnosis not present

## 2021-07-29 DIAGNOSIS — Z87891 Personal history of nicotine dependence: Secondary | ICD-10-CM | POA: Diagnosis not present

## 2021-07-29 DIAGNOSIS — Z9889 Other specified postprocedural states: Secondary | ICD-10-CM | POA: Diagnosis not present

## 2021-07-29 DIAGNOSIS — Z7982 Long term (current) use of aspirin: Secondary | ICD-10-CM | POA: Diagnosis not present

## 2021-07-29 DIAGNOSIS — Z794 Long term (current) use of insulin: Secondary | ICD-10-CM | POA: Insufficient documentation

## 2021-07-29 DIAGNOSIS — Z01818 Encounter for other preprocedural examination: Secondary | ICD-10-CM

## 2021-07-29 DIAGNOSIS — I2510 Atherosclerotic heart disease of native coronary artery without angina pectoris: Secondary | ICD-10-CM | POA: Insufficient documentation

## 2021-07-29 DIAGNOSIS — E1169 Type 2 diabetes mellitus with other specified complication: Secondary | ICD-10-CM

## 2021-07-29 HISTORY — PX: LUMBAR LAMINECTOMY: SHX95

## 2021-07-29 LAB — GLUCOSE, CAPILLARY
Glucose-Capillary: 240 mg/dL — ABNORMAL HIGH (ref 70–99)
Glucose-Capillary: 182 mg/dL — ABNORMAL HIGH (ref 70–99)
Glucose-Capillary: 209 mg/dL — ABNORMAL HIGH (ref 70–99)

## 2021-07-29 SURGERY — MICRODISCECTOMY LUMBAR LAMINECTOMY
Anesthesia: General

## 2021-07-29 MED ORDER — PHENYLEPHRINE HCL-NACL 20-0.9 MG/250ML-% IV SOLN
INTRAVENOUS | Status: DC | PRN
  Administered 2021-07-29: 100 ug/min via INTRAVENOUS

## 2021-07-29 MED ORDER — HYDROMORPHONE HCL 1 MG/ML IJ SOLN: 0.5000 mg | INTRAMUSCULAR | Status: DC | PRN

## 2021-07-29 MED ORDER — GABAPENTIN 300 MG PO CAPS: 300.0000 mg | ORAL_CAPSULE | Freq: Every day | ORAL | Status: DC

## 2021-07-29 MED ORDER — INSULIN ASPART 100 UNIT/ML IJ SOLN
INTRAMUSCULAR | Status: AC
Start: 2021-07-29 — End: 2021-07-29
  Administered 2021-07-29: 3 [IU] via SUBCUTANEOUS
  Filled 2021-07-29: qty 1

## 2021-07-29 MED ORDER — METHOCARBAMOL 500 MG PO TABS: 500.0000 mg | ORAL_TABLET | Freq: Four times a day (QID) | ORAL | Status: DC | PRN

## 2021-07-29 MED ORDER — EPHEDRINE 5 MG/ML INJ
INTRAVENOUS | Status: AC
  Filled 2021-07-29: qty 5

## 2021-07-29 MED ORDER — 0.9 % SODIUM CHLORIDE (POUR BTL) OPTIME
TOPICAL | Status: DC | PRN
  Administered 2021-07-29: 1000 mL

## 2021-07-29 MED ORDER — METHOCARBAMOL 500 MG PO TABS
500.0000 mg | ORAL_TABLET | Freq: Four times a day (QID) | ORAL | 0 refills | Status: DC | PRN
Start: 2021-07-29 — End: 2021-10-13

## 2021-07-29 MED ORDER — SUGAMMADEX SODIUM 200 MG/2ML IV SOLN
INTRAVENOUS | Status: DC | PRN
  Administered 2021-07-29: 200 mg via INTRAVENOUS

## 2021-07-29 MED ORDER — ROCURONIUM BROMIDE 10 MG/ML (PF) SYRINGE
PREFILLED_SYRINGE | INTRAVENOUS | Status: AC
  Filled 2021-07-29: qty 10

## 2021-07-29 MED ORDER — FENTANYL CITRATE (PF) 250 MCG/5ML IJ SOLN
INTRAMUSCULAR | Status: DC | PRN
  Administered 2021-07-29: 100 ug via INTRAVENOUS

## 2021-07-29 MED ORDER — PHENOL 1.4 % MT LIQD: 1.0000 | OROMUCOSAL | Status: DC | PRN

## 2021-07-29 MED ORDER — SODIUM CHLORIDE 0.9 % IV SOLN: INTRAVENOUS | Status: DC

## 2021-07-29 MED ORDER — ACETAMINOPHEN 500 MG PO TABS: 1000.0000 mg | ORAL_TABLET | Freq: Once | ORAL | Status: AC

## 2021-07-29 MED ORDER — PROBIOTIC-10 PO CHEW: CHEWABLE_TABLET | Freq: Every day | ORAL | Status: DC

## 2021-07-29 MED ORDER — MENTHOL 3 MG MT LOZG: 1.0000 | LOZENGE | OROMUCOSAL | Status: DC | PRN

## 2021-07-29 MED ORDER — PROPOFOL 10 MG/ML IV BOLUS
INTRAVENOUS | Status: AC
  Filled 2021-07-29: qty 20

## 2021-07-29 MED ORDER — FENTANYL CITRATE (PF) 100 MCG/2ML IJ SOLN: 25.0000 ug | INTRAMUSCULAR | Status: DC | PRN

## 2021-07-29 MED ORDER — ONDANSETRON HCL 4 MG PO TABS: 4.0000 mg | ORAL_TABLET | Freq: Four times a day (QID) | ORAL | Status: DC | PRN

## 2021-07-29 MED ORDER — SODIUM CHLORIDE 0.9% FLUSH: 3.0000 mL | INTRAVENOUS | Status: DC | PRN

## 2021-07-29 MED ORDER — ONDANSETRON HCL 4 MG/2ML IJ SOLN
INTRAMUSCULAR | Status: AC
  Filled 2021-07-29: qty 2

## 2021-07-29 MED ORDER — ACETAMINOPHEN 500 MG PO TABS
ORAL_TABLET | ORAL | Status: AC
  Administered 2021-07-29: 1000 mg via ORAL
  Filled 2021-07-29: qty 2

## 2021-07-29 MED ORDER — CEFAZOLIN SODIUM-DEXTROSE 2-4 GM/100ML-% IV SOLN
2.0000 g | INTRAVENOUS | Status: AC
  Administered 2021-07-29: 2 g via INTRAVENOUS

## 2021-07-29 MED ORDER — SODIUM CHLORIDE 0.9% FLUSH: 3.0000 mL | Freq: Two times a day (BID) | INTRAVENOUS | Status: DC

## 2021-07-29 MED ORDER — METFORMIN HCL 500 MG PO TABS
500.0000 mg | ORAL_TABLET | Freq: Two times a day (BID) | ORAL | Status: DC
Start: 2021-07-29 — End: 2021-07-29

## 2021-07-29 MED ORDER — CHLORHEXIDINE GLUCONATE 0.12 % MT SOLN
OROMUCOSAL | Status: AC
  Administered 2021-07-29: 15 mL via OROMUCOSAL
  Filled 2021-07-29: qty 15

## 2021-07-29 MED ORDER — MAGNESIUM GLUCONATE 500 MG PO TABS
500.0000 mg | ORAL_TABLET | Freq: Every day | ORAL | Status: DC
  Filled 2021-07-29: qty 1

## 2021-07-29 MED ORDER — PHENYLEPHRINE 80 MCG/ML (10ML) SYRINGE FOR IV PUSH (FOR BLOOD PRESSURE SUPPORT)
PREFILLED_SYRINGE | INTRAVENOUS | Status: DC | PRN
  Administered 2021-07-29: 160 ug via INTRAVENOUS
  Administered 2021-07-29: 80 ug via INTRAVENOUS
  Administered 2021-07-29 (×2): 160 ug via INTRAVENOUS
  Administered 2021-07-29: 80 ug via INTRAVENOUS
  Administered 2021-07-29: 160 ug via INTRAVENOUS

## 2021-07-29 MED ORDER — BUPIVACAINE HCL (PF) 0.25 % IJ SOLN
INTRAMUSCULAR | Status: DC | PRN
  Administered 2021-07-29: 10 mL

## 2021-07-29 MED ORDER — TAMSULOSIN HCL 0.4 MG PO CAPS
0.4000 mg | ORAL_CAPSULE | Freq: Every day | ORAL | Status: DC
Start: 2021-07-30 — End: 2021-07-29

## 2021-07-29 MED ORDER — CEFAZOLIN SODIUM-DEXTROSE 2-4 GM/100ML-% IV SOLN
INTRAVENOUS | Status: AC
  Filled 2021-07-29: qty 100

## 2021-07-29 MED ORDER — PHENYLEPHRINE 80 MCG/ML (10ML) SYRINGE FOR IV PUSH (FOR BLOOD PRESSURE SUPPORT)
PREFILLED_SYRINGE | INTRAVENOUS | Status: AC
  Filled 2021-07-29: qty 10

## 2021-07-29 MED ORDER — AMLODIPINE BESYLATE 5 MG PO TABS
5.0000 mg | ORAL_TABLET | Freq: Every day | ORAL | Status: DC
  Administered 2021-07-29: 5 mg via ORAL
  Filled 2021-07-29: qty 1

## 2021-07-29 MED ORDER — ALBUMIN HUMAN 5 % IV SOLN: INTRAVENOUS | Status: DC | PRN

## 2021-07-29 MED ORDER — FENTANYL CITRATE (PF) 250 MCG/5ML IJ SOLN
INTRAMUSCULAR | Status: AC
  Filled 2021-07-29: qty 5

## 2021-07-29 MED ORDER — CHLORHEXIDINE GLUCONATE 0.12 % MT SOLN: 15.0000 mL | Freq: Once | OROMUCOSAL | Status: AC

## 2021-07-29 MED ORDER — ROCURONIUM BROMIDE 10 MG/ML (PF) SYRINGE
PREFILLED_SYRINGE | INTRAVENOUS | Status: DC | PRN
  Administered 2021-07-29: 60 mg via INTRAVENOUS

## 2021-07-29 MED ORDER — DOXEPIN HCL 50 MG PO CAPS
50.0000 mg | ORAL_CAPSULE | Freq: Every day | ORAL | Status: DC
  Filled 2021-07-29: qty 1

## 2021-07-29 MED ORDER — POLYETHYLENE GLYCOL 3350 17 G PO PACK: 17.0000 g | PACK | Freq: Every day | ORAL | Status: DC | PRN

## 2021-07-29 MED ORDER — ORAL CARE MOUTH RINSE: 15.0000 mL | Freq: Once | OROMUCOSAL | Status: AC

## 2021-07-29 MED ORDER — FAMOTIDINE 20 MG PO TABS: 20.0000 mg | ORAL_TABLET | Freq: Two times a day (BID) | ORAL | Status: DC

## 2021-07-29 MED ORDER — BUPIVACAINE HCL (PF) 0.25 % IJ SOLN
INTRAMUSCULAR | Status: AC
  Filled 2021-07-29: qty 30

## 2021-07-29 MED ORDER — INSULIN ASPART 100 UNIT/ML IJ SOLN: 0.0000 [IU] | INTRAMUSCULAR | Status: DC | PRN

## 2021-07-29 MED ORDER — VASOPRESSIN 20 UNIT/ML IV SOLN
INTRAVENOUS | Status: DC | PRN
  Administered 2021-07-29 (×3): 1 [IU] via INTRAVENOUS

## 2021-07-29 MED ORDER — ZINC GLUCONATE 50 MG PO TABS: 50.0000 mg | ORAL_TABLET | Freq: Every day | ORAL | Status: DC

## 2021-07-29 MED ORDER — VASOPRESSIN 20 UNIT/ML IV SOLN
INTRAVENOUS | Status: AC
  Filled 2021-07-29: qty 1

## 2021-07-29 MED ORDER — DOCUSATE SODIUM 100 MG PO CAPS: 100.0000 mg | ORAL_CAPSULE | Freq: Two times a day (BID) | ORAL | Status: DC

## 2021-07-29 MED ORDER — MIDAZOLAM HCL 2 MG/2ML IJ SOLN
INTRAMUSCULAR | Status: DC | PRN
  Administered 2021-07-29: 2 mg via INTRAVENOUS

## 2021-07-29 MED ORDER — DEXAMETHASONE SODIUM PHOSPHATE 10 MG/ML IJ SOLN
INTRAMUSCULAR | Status: AC
  Filled 2021-07-29: qty 1

## 2021-07-29 MED ORDER — SEMAGLUTIDE(0.25 OR 0.5MG/DOS) 2 MG/1.5ML ~~LOC~~ SOPN
0.5000 mg | PEN_INJECTOR | SUBCUTANEOUS | Status: DC
Start: 2021-07-29 — End: 2021-07-29

## 2021-07-29 MED ORDER — MIDAZOLAM HCL 2 MG/2ML IJ SOLN
INTRAMUSCULAR | Status: AC
  Filled 2021-07-29: qty 2

## 2021-07-29 MED ORDER — SODIUM CHLORIDE 0.9 % IV SOLN: 250.0000 mL | INTRAVENOUS | Status: DC

## 2021-07-29 MED ORDER — IRBESARTAN 300 MG PO TABS: 300.0000 mg | ORAL_TABLET | Freq: Every day | ORAL | Status: DC

## 2021-07-29 MED ORDER — PRAVASTATIN SODIUM 10 MG PO TABS
20.0000 mg | ORAL_TABLET | Freq: Every day | ORAL | Status: DC
  Administered 2021-07-29: 20 mg via ORAL
  Filled 2021-07-29: qty 2

## 2021-07-29 MED ORDER — ONDANSETRON HCL 4 MG/2ML IJ SOLN
INTRAMUSCULAR | Status: DC | PRN
  Administered 2021-07-29: 4 mg via INTRAVENOUS

## 2021-07-29 MED ORDER — MECLIZINE HCL 25 MG PO TABS: 25.0000 mg | ORAL_TABLET | Freq: Three times a day (TID) | ORAL | Status: DC | PRN

## 2021-07-29 MED ORDER — EPHEDRINE SULFATE-NACL 50-0.9 MG/10ML-% IV SOSY
PREFILLED_SYRINGE | INTRAVENOUS | Status: DC | PRN
  Administered 2021-07-29: 5 mg via INTRAVENOUS

## 2021-07-29 MED ORDER — OXYCODONE-ACETAMINOPHEN 5-325 MG PO TABS: 1.0000 | ORAL_TABLET | ORAL | 0 refills | Status: DC | PRN

## 2021-07-29 MED ORDER — RISAQUAD PO CAPS
1.0000 | ORAL_CAPSULE | Freq: Every day | ORAL | Status: DC
  Administered 2021-07-29: 1 via ORAL
  Filled 2021-07-29: qty 1

## 2021-07-29 MED ORDER — LIDOCAINE 2% (20 MG/ML) 5 ML SYRINGE
INTRAMUSCULAR | Status: AC
  Filled 2021-07-29: qty 5

## 2021-07-29 MED ORDER — PROPOFOL 10 MG/ML IV BOLUS
INTRAVENOUS | Status: DC | PRN
  Administered 2021-07-29: 100 mg via INTRAVENOUS

## 2021-07-29 MED ORDER — LIDOCAINE 2% (20 MG/ML) 5 ML SYRINGE
INTRAMUSCULAR | Status: DC | PRN
  Administered 2021-07-29: 60 mg via INTRAVENOUS

## 2021-07-29 MED ORDER — INSULIN ASPART 100 UNIT/ML IJ SOLN
0.0000 [IU] | Freq: Three times a day (TID) | INTRAMUSCULAR | Status: DC
  Administered 2021-07-29: 5 [IU] via SUBCUTANEOUS

## 2021-07-29 MED ORDER — ONDANSETRON HCL 4 MG/2ML IJ SOLN: 4.0000 mg | Freq: Four times a day (QID) | INTRAMUSCULAR | Status: DC | PRN

## 2021-07-29 MED ORDER — LACTATED RINGERS IV SOLN: INTRAVENOUS | Status: DC

## 2021-07-29 MED ORDER — METHOCARBAMOL 1000 MG/10ML IJ SOLN
500.0000 mg | Freq: Four times a day (QID) | INTRAVENOUS | Status: DC | PRN
  Filled 2021-07-29: qty 5

## 2021-07-29 MED ORDER — OXYCODONE HCL 5 MG PO TABS: 5.0000 mg | ORAL_TABLET | ORAL | Status: DC | PRN

## 2021-07-29 SURGICAL SUPPLY — 48 items
BAG COUNTER SPONGE SURGICOUNT (BAG) ×2 IMPLANT
BUR ROUND FLUTED 4 SOFT TCH (BURR) IMPLANT
CANISTER SUCT 3000ML PPV (MISCELLANEOUS) ×2 IMPLANT
COVER SURGICAL LIGHT HANDLE (MISCELLANEOUS) ×2 IMPLANT
DERMABOND ADVANCED (GAUZE/BANDAGES/DRESSINGS) ×1
DERMABOND ADVANCED .7 DNX12 (GAUZE/BANDAGES/DRESSINGS) ×1 IMPLANT
DRAPE INCISE IOBAN 66X45 STRL (DRAPES) ×1 IMPLANT
DRAPE MICROSCOPE LEICA (MISCELLANEOUS) ×2 IMPLANT
DRSG MEPILEX BORDER 4X4 (GAUZE/BANDAGES/DRESSINGS) IMPLANT
DRSG MEPILEX BORDER 4X8 (GAUZE/BANDAGES/DRESSINGS) ×1 IMPLANT
DURAPREP 26ML APPLICATOR (WOUND CARE) ×2 IMPLANT
DURASEAL SPINE SEALANT 3ML (MISCELLANEOUS) IMPLANT
ELECT REM PT RETURN 9FT ADLT (ELECTROSURGICAL) ×2
ELECTRODE REM PT RTRN 9FT ADLT (ELECTROSURGICAL) ×1 IMPLANT
GAUZE SPONGE 4X4 12PLY STRL (GAUZE/BANDAGES/DRESSINGS) ×2 IMPLANT
GLOVE BIO SURGEON STRL SZ7 (GLOVE) ×6 IMPLANT
GLOVE BIOGEL PI IND STRL 7.0 (GLOVE) IMPLANT
GLOVE BIOGEL PI IND STRL 8 (GLOVE) ×2 IMPLANT
GLOVE BIOGEL PI INDICATOR 7.0 (GLOVE) ×4
GLOVE BIOGEL PI INDICATOR 8 (GLOVE) ×2
GLOVE ORTHO TXT STRL SZ7.5 (GLOVE) ×4 IMPLANT
GLOVE SS N UNI LF 7.0 STRL (GLOVE) ×4 IMPLANT
GOWN STRL REUS W/ TWL LRG LVL3 (GOWN DISPOSABLE) ×1 IMPLANT
GOWN STRL REUS W/ TWL XL LVL3 (GOWN DISPOSABLE) ×1 IMPLANT
GOWN STRL REUS W/TWL 2XL LVL3 (GOWN DISPOSABLE) ×2 IMPLANT
GOWN STRL REUS W/TWL LRG LVL3 (GOWN DISPOSABLE) ×1
GOWN STRL REUS W/TWL XL LVL3 (GOWN DISPOSABLE) ×1
KIT BASIN OR (CUSTOM PROCEDURE TRAY) ×2 IMPLANT
KIT TURNOVER KIT B (KITS) ×2 IMPLANT
NDL SPNL 18GX3.5 QUINCKE PK (NEEDLE) ×1 IMPLANT
NEEDLE SPNL 18GX3.5 QUINCKE PK (NEEDLE) ×2 IMPLANT
NS IRRIG 1000ML POUR BTL (IV SOLUTION) ×2 IMPLANT
PACK LAMINECTOMY ORTHO (CUSTOM PROCEDURE TRAY) ×2 IMPLANT
PAD ARMBOARD 7.5X6 YLW CONV (MISCELLANEOUS) ×4 IMPLANT
PATTIES SURGICAL .5 X.5 (GAUZE/BANDAGES/DRESSINGS) IMPLANT
PATTIES SURGICAL .75X.75 (GAUZE/BANDAGES/DRESSINGS) IMPLANT
STAPLER VISISTAT (STAPLE) ×1 IMPLANT
SUT VIC AB 0 CT1 27 (SUTURE) ×2
SUT VIC AB 0 CT1 27XBRD ANBCTR (SUTURE) IMPLANT
SUT VIC AB 1 CTX 36 (SUTURE) ×2
SUT VIC AB 1 CTX36XBRD ANBCTR (SUTURE) ×1 IMPLANT
SUT VIC AB 2-0 CT1 27 (SUTURE) ×1
SUT VIC AB 2-0 CT1 TAPERPNT 27 (SUTURE) ×1 IMPLANT
SUT VIC AB 3-0 X1 27 (SUTURE) IMPLANT
SYR 20ML ECCENTRIC (SYRINGE) IMPLANT
TOWEL GREEN STERILE (TOWEL DISPOSABLE) ×2 IMPLANT
TOWEL GREEN STERILE FF (TOWEL DISPOSABLE) ×2 IMPLANT
WATER STERILE IRR 1000ML POUR (IV SOLUTION) ×2 IMPLANT

## 2021-07-29 NOTE — Transfer of Care (Signed)
Immediate Anesthesia Transfer of Care Note ? ?Patient: Jeffrey Rubio ? ?Procedure(s) Performed: RIGHT L3-4 MICRODISCECTOMY, and removal of fragment ? ?Patient Location: PACU ? ?Anesthesia Type:General ? ?Level of Consciousness: awake, alert , patient cooperative and responds to stimulation ? ?Airway & Oxygen Therapy: Patient Spontanous Breathing and Patient connected to nasal cannula oxygen ? ?Post-op Assessment: Report given to RN, Post -op Vital signs reviewed and stable and Patient moving all extremities X 4 ? ?Post vital signs: Reviewed and stable ? ?Last Vitals:  ?Vitals Value Taken Time  ?BP 114/66 07/29/21 0927  ?Temp    ?Pulse 85 07/29/21 0930  ?Resp 18 07/29/21 0930  ?SpO2 93 % 07/29/21 0930  ?Vitals shown include unvalidated device data. ? ?Last Pain:  ?Vitals:  ? 07/29/21 0605  ?TempSrc: Oral  ?PainSc: 8   ?   ? ?Patients Stated Pain Goal: 2 (07/29/21 1464) ? ?Complications: No notable events documented. ?

## 2021-07-29 NOTE — Op Note (Signed)
Pre and postop diagnosis: L3-4 HNP with free fragment and radiculopathy. ? ?Procedure: Right L3-4 microdiscectomy removal of free fragment, hemilaminectomy. ? ?Surgeon: Rodell Perna, MD ? ?Assistant: Benjiman Core, PA-C medically necessary and present for the microscopic exposure critical portion of the procedure and closure. ? ?Anesthesia: General orotracheal ? ?EBL 100 cc ? ?Complications: None. ? ?Procedure after standard prepping draping with patient intubated prone chest rolls leg bumpers Ancef prophylaxis back was prepped with DuraPrep.  Arms were at 9090 yellow rolled foam pads anterior to the shoulders and under the ulnar nerve as well as knees.  There is squared with towels Betadine Steri-Drape laminectomy sheet spinal needle placement crosstable lateral x-ray for localization.  Midline incision was made a few millimeters to the right at the L3-4 level.  Patient had free fragment that migrated cephalad from the disc space up to the level of the pedicle at L3 causing radiculopathy and nerve compression.  Patient had failed conservative treatment including steroids pain medication, therapy, epidural steroids.  Patient had both MRI scan and myelogram CT scan showing a large free fragment.  Lamina was thinned with a 4 mm bur operative microscope was draped brought in and remaining portion the lamina was removed with a 3 mm Kerrison.  Careful protection of neural elements was performed and thick chunks of ligament were removed.  Some lateral bone outflow of the pedicle was removed with an osteotome.  Bipolar cautery was used for some epidural veins.  Disc base was well-visualized and repeat x-ray confirm appropriate level.  There was defect in the annulus and the micropituitary is able to be probed poked into the disc base and some chunks of disc were removed there was fibrous sheath that extended cephalad and using ball-tipped probe multiple free fragment chunks were removed pulled caudally and then removed with a  micropituitary or sucked up with suction.  Continued decompression followed by use of Woodson and then hockey-stick to make sure there was complete decompression and all fragments removed up to the level of the pedicle corresponding with imaging studies.  Nerve root was free foraminal was enlarged just below the level of the disc.  Continue passing through the disc with straight micropituitary upbiters to make sure no remaining fragments right behind the piece that had been extruded.  Epidural space was dry copious irrigation bipolar cautery again for some epidural veins.  Closure of fascia with #1 Vicryl 020 and subtenons tissue skin staple closure Marcaine infiltration and postop dressing.  Patient taught procedure well transferred to care room in stable condition. ?

## 2021-07-29 NOTE — Interval H&P Note (Signed)
History and Physical Interval Note: ? ?07/29/2021 ?7:26 AM ? 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  The various methods of treatment have been discussed with the patient and family. After consideration of risks, benefits and other options for treatment, the patient has consented to  Procedure(s): ?RIGHT L3-4 MICRODISCECTOMY (N/A) as a surgical intervention.  The patient's history has been reviewed, patient examined, no change in status, stable for surgery.  I have reviewed the patient's chart and labs.  Questions were answered to the patient's satisfaction.   ? ? ?Jeffrey Rubio ? ? ?

## 2021-07-29 NOTE — Evaluation (Signed)
Occupational Therapy Evaluation ?Patient Details ?Name: Jeffrey Rubio ?MRN: [id number] ?DOB: [birth year] ?Today's Date: 07/29/2021 ? ? ?History of Present Illness 75 yo male s/p L3-4 microdiscetomy removal of free fragment hemilamiectomy PMH DM, diverticulosis, fibromyalgia, gout, HLD, HTN, single kidney, R rotator cuff repair, remote smoker  ? ?Clinical Impression ?  ?Patient evaluated by Occupational Therapy with no further acute OT needs identified. All education has been completed and the patient has no further questions. See below for any follow-up Occupational Therapy or equipment needs. OT to sign off. Thank you for referral.  ?  ?   ? ?Recommendations for follow up therapy are one component of a multi-disciplinary discharge planning process, led by the attending physician.  Recommendations may be updated based on patient status, additional functional criteria and insurance authorization.  ? ?Follow Up Recommendations ? No OT follow up  ?  ?Assistance Recommended at Discharge None  ?Patient can return home with the following   ? ?  ?Functional Status Assessment ? Patient has had a recent decline in their functional status and demonstrates the ability to make significant improvements in function in a reasonable and predictable amount of time.  ?Equipment Recommendations ? None recommended by OT  ?  ?Recommendations for Other Services   ? ? ?  ?Precautions / Restrictions Precautions ?Precautions: Back ?Precaution Comments: handout provided and reviewed for back precuations ?Restrictions ?Weight Bearing Restrictions: No  ? ?  ? ?Mobility Bed Mobility ?Overal bed mobility: Modified Independent ?  ?  ?  ?  ?  ?  ?General bed mobility comments: needs cues to prevent segmental rolling. pt demonstrated x3 during session ?  ? ?Transfers ?Overall transfer level: Independent ?  ?  ?  ?  ?  ?  ?  ?  ?General transfer comment: no DME ?  ? ?  ?Balance Overall balance assessment: Modified Independent ?  ?  ?  ?  ?  ?  ?  ?   ?  ?  ?  ?  ?  ?  ?  ?  ?  ?  ?   ? ?ADL either performed or assessed with clinical judgement  ? ?ADL Overall ADL's : Modified independent ?  ?  ?  ?  ?  ?  ?  ?  ?  ?  ?  ?  ?  ?  ?  ?  ?  ?  ?  ?General ADL Comments: cues to back precautions during adls. pt able to figure 4 cross  ? ?Back handout provided and reviewed adls in detail. Pt educated on: set an alarm at night for medication, avoid sitting for long periods of time, correct bed positioning for sleeping, correct sequence for bed mobility, avoiding lifting more than 5 pounds and never wash directly over incision. All education is complete and patient indicates understanding.Wife present for all ? ? ?Vision Baseline Vision/History: 1 Wears glasses ?Ability to See in Adequate Light: 0 Adequate ?Patient Visual Report: No change from baseline ?   ?   ?Perception   ?  ?Praxis   ?  ? ?Pertinent Vitals/Pain Pain Assessment ?Pain Assessment: No/denies pain  ? ? ? ?Hand Dominance Right ?  ?Extremity/Trunk Assessment Upper Extremity Assessment ?Upper Extremity Assessment: Overall WFL for tasks assessed ?  ?Lower Extremity Assessment ?Lower Extremity Assessment: RLE deficits/detail ?RLE Deficits / Details: reports no pain in R LE. pt reports this is the first time i have had no pain in that leg for months. ?  ?  Cervical / Trunk Assessment ?Cervical / Trunk Assessment: Back Surgery ?  ?Communication Communication ?Communication: No difficulties ?  ?Cognition Arousal/Alertness: Awake/alert ?Behavior During Therapy: [facility] for tasks assessed/performed ?Overall Cognitive Status: Within Functional Limits for tasks assessed ?  ?  ?  ?  ?  ?  ?  ?  ?  ?  ?  ?  ?  ?  ?  ?  ?  ?  ?  ?General Comments  dressing dry and intact.; Educated on removing if dressing shows signs of water ? ?  ?Exercises   ?  ?Shoulder Instructions    ? ? ?Home Living Family/patient expects to be discharged to:: Private residence ?Living Arrangements: Spouse/significant other ?Available Help at  Discharge: Family;Available 24 hours/day ?Type of Home: House ?Home Access: Stairs to enter ?Entrance Stairs-Number of Steps: 1 ?Entrance Stairs-Rails: None ?Home Layout: One level ?  ?  ?Bathroom Shower/Tub: Tub/shower unit ?  ?Bathroom Toilet: Standard ?  ?  ?Home Equipment: Kasandra Knudsen - single point ?  ?Additional Comments: pt plans to go to the beach house at [facility] after doctor follow up appointment. 1 level 2 stairs walk in shower. [place] ( cows chicken donkey[place] Currently has hired (A) for animals and mowing ?  ? ?  ?Prior Functioning/Environment Prior Level of Function : Independent/Modified Independent ?  ?  ?  ?  ?  ?  ?  ?  ?  ? ?  ?  ?OT Problem List:   ?  ?   ?OT Treatment/Interventions:    ?  ?OT Goals(Current goals can be found in the care plan section) Acute Rehab OT Goals ?Patient Stated Goal: to go to the beach house ?OT Goal Formulation: With patient ?Potential to Achieve Goals: Good  ?OT Frequency:   ?  ? ?Co-evaluation   ?  ?  ?  ?  ? ?  ?AM-PAC OT "6 Clicks" Daily Activity     ?Outcome Measure Help from another person eating meals?: None ?Help from another person taking care of personal grooming?: None ?Help from another person toileting, which includes using toliet, bedpan, or urinal?: None ?Help from another person bathing (including washing, rinsing, drying)?: None ?Help from another person to put on and taking off regular upper body clothing?: None ?Help from another person to put on and taking off regular lower body clothing?: None ?6 Click Score: 24 ?  ?End of Session Nurse Communication: Mobility status;Precautions ? ?Activity Tolerance: Patient tolerated treatment well ?Patient left: in bed;with call bell/phone within reach ? ?OT Visit Diagnosis: Unsteadiness on feet (R26.81)  ?              ?Time: 3817-7116 ?OT Time Calculation (min): 26 min ?Charges:  OT General Charges ?$OT Visit: 1 Visit ?OT Evaluation ?$OT Eval Moderate Complexity: 1 Mod ?OT Treatments ?$Self  Care/Home Management : 8-22 mins ? ? ?Brynn, OTR/L  ?Acute Rehabilitation Services ?Office: [phone] ?. ? ? ?Jeri Modena ?07/29/2021, 1:23 PM ?

## 2021-07-29 NOTE — Anesthesia Postprocedure Evaluation (Signed)
Anesthesia Post Note ? ?Patient: Jeffrey Rubio ? ?Procedure(s) Performed: RIGHT L3-4 MICRODISCECTOMY, and removal of fragment ? ?  ? ?Patient location during evaluation: PACU ?Anesthesia Type: General ?Level of consciousness: awake and alert ?Pain management: pain level controlled ?Vital Signs Assessment: post-procedure vital signs reviewed and stable ?Respiratory status: spontaneous breathing, nonlabored ventilation, respiratory function stable and patient connected to nasal cannula oxygen ?Cardiovascular status: blood pressure returned to baseline and stable ?Postop Assessment: no apparent nausea or vomiting ?Anesthetic complications: no ? ? ?No notable events documented. ? ?Last Vitals:  ?Vitals:  ? 07/29/21 1019 07/29/21 1033  ?BP: 119/74 117/74  ?Pulse: 87 94  ?Resp: 16 20  ?Temp: 36.8 ?C 36.7 ?C  ?SpO2: 94% 97%  ?  ?Last Pain:  ?Vitals:  ? 07/29/21 1035  ?TempSrc:   ?PainSc: 0-No pain  ? ? ?  ?  ?  ?  ?  ?  ? ?Georgene Kopper L Jameya Pontiff ? ? ? ? ?

## 2021-07-29 NOTE — Anesthesia Procedure Notes (Addendum)
Procedure Name: Intubation ?Date/Time: 07/29/2021 7:48 AM ?Performed by: Michele Rockers, CRNA ?Pre-anesthesia Checklist: Patient identified, Patient being monitored, Timeout performed, Emergency Drugs available and Suction available ?Patient Re-evaluated:Patient Re-evaluated prior to induction ?Oxygen Delivery Method: Circle system utilized ?Preoxygenation: Pre-oxygenation with 100% oxygen ?Induction Type: IV induction ?Ventilation: Mask ventilation without difficulty ?Laryngoscope Size: Sabra Heck and 2 ?Grade View: Grade I ?Tube type: Oral ?Tube size: 7.5 mm ?Number of attempts: 1 ?Airway Equipment and Method: Stylet ?Placement Confirmation: ETT inserted through vocal cords under direct vision, positive ETCO2 and breath sounds checked- equal and bilateral ?Secured at: 22 cm ?Tube secured with: Tape ?Dental Injury: Teeth and Oropharynx as per pre-operative assessment  ? ? ? ? ?

## 2021-07-29 NOTE — Progress Notes (Signed)
PT Cancellation Note ? ?Patient Details ?Name: Jeffrey Rubio ?MRN: [id number] ?DOB: 22-Dec-1945 ? ? ?Cancelled Treatment:    Reason Eval/Treat Not Completed: PT screened, no needs identified, will sign off. Discussed pt case with OT who reports pt is independent and does not require a formal PT evaluation at this time. Will sign off. If needs change, please reconsult.  ? ? ?Thelma Comp ?07/29/2021, 12:57 PM ? ?Rolinda Roan, PT, DPT ?Acute Rehabilitation Services ?Secure Chat Preferred ?Office: [phone] ? ?

## 2021-07-29 NOTE — Plan of Care (Signed)
Pt doing well. Pt and wife given D/C instructions with verbal understanding. Rx's were sent to the pharmacy by MD. Pt's incision is clean and dry with no sign of infection. Pt's IV was removed prior to D/C. Pt D/C'd home via wheelchair per MD order. Pt is stable @ D/C and has no other needs at this time. Maymunah Stegemann, RN  

## 2021-07-29 NOTE — H&P (Signed)
Jeffrey Rubio is an 76 y.o. male.   ?Chief Complaint: back pain and right LE radiculopathy ? ?HPI: 76 year old white male with history of right L3-4 HNP comes in for preop evaluation.  States that symptoms unchanged from previous visit.  He is wanting to proceed with right L3-4 microdiscectomy as scheduled.  Today history and physical performed.  Review of systems negative ? ?Past Medical History:  ?Diagnosis Date  ? Asthmatic bronchitis   ? BPH (benign prostatic hypertrophy)   ? Cataracts, bilateral   ? Colon polyps   ? Diabetes mellitus   ? Diverticulosis   ? Fibromyalgia   ? GERD (gastroesophageal reflux disease)   ? Gout   ? History of nephrectomy, unilateral   ? left   ? Hyperlipidemia   ? Hypertension   ? Single kidney   ? ? ?Past Surgical History:  ?Procedure Laterality Date  ? APPENDECTOMY    ? CHOLECYSTECTOMY    ? COLONOSCOPY W/ POLYPECTOMY    ? EYE SURGERY    ? catarat - right   ? hand fracture Left   ? plate in right hand from when he crushed it in a hay baler  ? LEFT HEART CATH AND CORONARY ANGIOGRAPHY N/A 01/11/2017  ? Procedure: LEFT HEART CATH AND CORONARY ANGIOGRAPHY;  Surgeon: Martinique, Peter M, MD;  Location: [place] [facility];  Service: Cardiovascular;  Laterality: N/A;  ? NEPHRECTOMY  1996  ? left , secondary to nephrolithiasis   ? ROTATOR CUFF REPAIR Right   ? ? ?Family History  ?Problem Relation Age of Onset  ? Coronary artery disease Other   ?     family history   ? Hypertension Other   ?     family history   ? Alzheimer's disease Mother 95  ? Cancer Father   ?     prostate  ? Stroke Father   ? CAD Father 62  ?     CABG  ? Hypertension Sister   ? Hypercalcemia Brother   ? Hypertension Brother   ? Kidney Stones Son   ? Breast cancer Maternal Grandmother   ? Diabetes Paternal Grandmother   ? Other Paternal Grandmother   ?     Crite's disease  ? Suicidality Paternal Grandfather   ? ?Social History:  reports that he quit smoking about 51 years ago. His smoking use included cigarettes. He has a  12.00 pack-year smoking history. He has never used smokeless tobacco. He reports that he does not drink alcohol and does not use drugs. ? ?Allergies:  ?Allergies  ?Allergen Reactions  ? Hydrocodone-Acetaminophen   ?  Causes Anxiety   ? ? ?Medications Prior to Admission  ?Medication Sig Dispense Refill  ? acetaminophen-codeine (TYLENOL #3) 300-30 MG tablet Take 1-2 tablets by mouth every 4 (four) hours as needed for moderate pain.    ? amLODipine (NORVASC) 5 MG tablet Take 1 tablet (5 mg total) by mouth daily. 90 tablet 3  ? Cholecalciferol (VITAMIN D3) 2000 units TABS Take 4,000 Units by mouth every morning.    ? doxepin (SINEQUAN) 50 MG capsule Take 1 capsule (50 mg total) by mouth at bedtime as needed. (Patient taking differently: Take 50 mg by mouth at bedtime.) 30 capsule 2  ? famotidine (PEPCID) 20 MG tablet Take 1 tablet (20 mg total) by mouth 2 (two) times daily. 180 tablet 3  ? gabapentin (NEURONTIN) 300 MG capsule Take 1 capsule (300 mg total) by mouth 3 (three) times daily. (Patient taking differently:  Take 300 mg by mouth at bedtime.) 90 capsule 1  ? irbesartan (AVAPRO) 300 MG tablet Take 1 tablet (300 mg total) by mouth daily. 90 tablet 3  ? magnesium gluconate (MAGONATE) 500 MG tablet Take 500 mg by mouth daily.    ? meclizine (ANTIVERT) 25 MG tablet Take 1 tablet (25 mg total) by mouth 3 (three) times daily as needed for dizziness. 30 tablet 0  ? metFORMIN (GLUCOPHAGE) 500 MG tablet Take 1 tablet (500 mg total) by mouth 2 (two) times daily with a meal. 180 tablet 3  ? Omega-3 Fatty Acids (FISH OIL) 1000 MG CAPS Take 1,000 mg by mouth in the morning and at bedtime.    ? pravastatin (PRAVACHOL) 20 MG tablet Take 1 tablet (20 mg total) by mouth daily. 90 tablet 3  ? Probiotic Product (PROBIOTIC-10 PO) Take 1 capsule by mouth daily.    ? Psyllium (METAMUCIL) 28.3 % POWD Take 3 Scoops by mouth daily.    ? tamsulosin (FLOMAX) 0.4 MG CAPS capsule Take 1 capsule (0.4 mg total) by mouth daily. 90 capsule 3  ?  vitamin B-12 (CYANOCOBALAMIN) 1000 MCG tablet Take 1,000 mcg by mouth daily.    ? zinc gluconate 50 MG tablet Take 50 mg by mouth daily.    ? aspirin 81 MG tablet Take 81 mg by mouth daily.    ? Blood Glucose Monitoring Suppl (ACCU-CHEK AVIVA PLUS) w/Device KIT USE to check blood sugars daily 1 kit 0  ? glucose blood (ACCU-CHEK AVIVA PLUS) test strip TEST BLOOD SUGAR DAILY DX E11.69 100 strip 9  ? Semaglutide,0.25 or 0.5MG/DOS, (OZEMPIC, 0.25 OR 0.5 MG/DOSE,) 2 MG/1.5ML SOPN Inject 0.5 mg into the skin once a week. 6 mL 3  ? TRUEplus Lancets 28G MISC Use to check blood sugar once daily and PRN Dx. E11.9 100 each 3  ? ? ?Results for orders placed or performed during the hospital encounter of 07/29/21 (from the past 48 hour(s))  ?Glucose, capillary     Status: Abnormal  ? Collection Time: 07/29/21  6:07 AM  ?Result Value Ref Range  ? Glucose-Capillary 240 (H) 70 - 99 mg/dL  ?  Comment: Glucose reference range applies only to samples taken after fasting for at least 8 hours.  ? ?No results found. ? ?Review of Systems  ?Constitutional:  Positive for activity change.  ?HENT: Negative.    ?Respiratory: Negative.    ?Cardiovascular: Negative.   ?Genitourinary: Negative.   ?Neurological:  Positive for numbness.  ?Psychiatric/Behavioral: Negative.    ? ?Blood pressure (!) 142/78, pulse 92, temperature 98.1 ?F (36.7 ?C), temperature source Oral, resp. rate 18, height '5\' 7"'  (1.702 m), weight 78.4 kg, SpO2 94 %. ?Physical Exam ?HENT:  ?   Head: Atraumatic.  ?   Nose: Nose normal.  ?Cardiovascular:  ?   Rate and Rhythm: Regular rhythm.  ?Pulmonary:  ?   Breath sounds: Normal breath sounds.  ?Neurological:  ?   Mental Status: He is alert and oriented to person, place, and time.  ?Psychiatric:     ?   Mood and Affect: Mood normal.  ?  ? ?Assessment/Plan ?Right L3-4 HNP ? ?Will proceed with Right L3-4 microdiscectomy as scheduled.  All questions answered.   ? ?Benjiman Core, PA-C ?07/29/2021, 7:09 AM ? ? ? ?

## 2021-07-29 NOTE — Discharge Instructions (Signed)
Ok to shower 5 days postop.  Do not apply any creams or ointments to incision.   Can use 4x4 gauze and tape for dressing changes.   ? ?No aggressive activity.   ? ?No twisting, bending, squatting or prolonged sitting.  Mostly be in reclined position or lying down.  Ok to walk but no long distances.  ? ?No driving. ? ?Call office immediately if you have any questions or concerns ?  ?

## 2021-07-30 ENCOUNTER — Encounter (HOSPITAL_COMMUNITY): Payer: Self-pay | Admitting: Orthopaedic Surgery

## 2021-08-02 NOTE — Discharge Summary (Signed)
? ?Patient ID: ?Jeffrey Rubio ?MRN: [id number] ?DOB/AGE: 1945-09-27 76 y.o. ? ?Admit date: 07/29/2021 ?Discharge date: 07/29/2021 ? ?Admission Diagnoses:  ?Principal Problem: ?  HNP (herniated nucleus pulposus), lumbar ?Active Problems: ?  Lumbar disc herniation with radiculopathy ? ? ?Discharge Diagnoses:  ?Principal Problem: ?  HNP (herniated nucleus pulposus), lumbar ?Active Problems: ?  Lumbar disc herniation with radiculopathy ? status post Procedure(s): ?RIGHT L3-4 MICRODISCECTOMY, and removal of fragment ? ?Past Medical History:  ?Diagnosis Date  ? Asthmatic bronchitis   ? BPH (benign prostatic hypertrophy)   ? Cataracts, bilateral   ? Colon polyps   ? Diabetes mellitus   ? Diverticulosis   ? Fibromyalgia   ? GERD (gastroesophageal reflux disease)   ? Gout   ? History of nephrectomy, unilateral   ? left   ? Hyperlipidemia   ? Hypertension   ? Single kidney   ? ? ?Surgeries: Procedure(s): ?RIGHT L3-4 MICRODISCECTOMY, and removal of fragment on 07/29/2021 ?  ?Consultants:  ? ?Discharged Condition: Improved ? ?Hospital Course: Jeffrey Rubio is an 76 y.o. male who was admitted 07/29/2021 for operative treatment of HNP (herniated nucleus pulposus), lumbar. Patient failed conservative treatments (please see the history and physical for the specifics) and had severe unremitting pain that affects sleep, daily activities and work/hobbies. After pre-op clearance, the patient was taken to the operating room on 07/29/2021 and underwent  Procedure(s): ?RIGHT L3-4 MICRODISCECTOMY, and removal of fragment.   ? ?Patient was given perioperative antibiotics:  ?Anti-infectives (From admission, onward)  ? ? Start     Dose/Rate Route Frequency Ordered Stop  ? 07/29/21 0600  ceFAZolin (ANCEF) IVPB 2g/100 mL premix       ? 2 g ?200 mL/hr over 30 Minutes Intravenous On call to O.R. 07/29/21 2119 07/29/21 0736  ? 07/29/21 0559  ceFAZolin (ANCEF) 2-4 GM/100ML-% IVPB       ?Note to Pharmacy: Gustavo Lah J: cabinet override  ?     07/29/21 0559 07/29/21 0737  ? ?  ?  ? ?Patient was given sequential compression devices and early ambulation to prevent DVT.  ? ?Patient benefited maximally from hospital stay and there were no complications. At the time of discharge, the patient was urinating/moving their bowels without difficulty, tolerating a regular diet, pain is controlled with oral pain medications and they have been cleared by PT/OT.  ? ?Recent vital signs: No data found.  ? ?Recent laboratory studies: No results for input(s): WBC, HGB, HCT, PLT, NA, K, CL, CO2, BUN, CREATININE, GLUCOSE, INR, CALCIUM in the last 72 hours. ? ?Invalid input(s): PT, 2 ? ? ?Discharge Medications:   ?Allergies as of 07/29/2021   ? ?   Reactions  ? Hydrocodone-acetaminophen   ? Causes Anxiety   ? ?  ? ?  ?Medication List  ?  ? ?STOP taking these medications   ? ?acetaminophen-codeine 300-30 MG tablet ?Commonly known as: TYLENOL #3 ?  ? ?  ? ?TAKE these medications   ? ?Accu-Chek Aviva Plus test strip ?Generic drug: glucose blood ?TEST BLOOD SUGAR DAILY DX E11.69 ?  ?Accu-Chek Aviva Plus w/Device Kit ?USE to check blood sugars daily ?  ?amLODipine 5 MG tablet ?Commonly known as: NORVASC ?Take 1 tablet (5 mg total) by mouth daily. ?  ?aspirin 81 MG tablet ?Take 81 mg by mouth daily. ?  ?doxepin 50 MG capsule ?Commonly known as: SINEQUAN ?Take 1 capsule (50 mg total) by mouth at bedtime as needed. ?What changed: when to take this ?  ?  famotidine 20 MG tablet ?Commonly known as: PEPCID ?Take 1 tablet (20 mg total) by mouth 2 (two) times daily. ?  ?Fish Oil 1000 MG Caps ?Take 1,000 mg by mouth in the morning and at bedtime. ?  ?gabapentin 300 MG capsule ?Commonly known as: NEURONTIN ?Take 1 capsule (300 mg total) by mouth 3 (three) times daily. ?What changed: when to take this ?  ?irbesartan 300 MG tablet ?Commonly known as: AVAPRO ?Take 1 tablet (300 mg total) by mouth daily. ?  ?magnesium gluconate 500 MG tablet ?Commonly known as: MAGONATE ?Take 500 mg by mouth  daily. ?  ?meclizine 25 MG tablet ?Commonly known as: ANTIVERT ?Take 1 tablet (25 mg total) by mouth 3 (three) times daily as needed for dizziness. ?  ?Metamucil 28.3 % Powd ?Generic drug: Psyllium ?Take 3 Scoops by mouth daily. ?  ?metFORMIN 500 MG tablet ?Commonly known as: GLUCOPHAGE ?Take 1 tablet (500 mg total) by mouth 2 (two) times daily with a meal. ?  ?methocarbamol 500 MG tablet ?Commonly known as: ROBAXIN ?Take 1 tablet (500 mg total) by mouth every 6 (six) hours as needed for muscle spasms. ?  ?oxyCODONE-acetaminophen 5-325 MG tablet ?Commonly known as: Percocet ?Take 1 tablet by mouth every 4 (four) hours as needed for severe pain. ?  ?Ozempic (0.25 or 0.5 MG/DOSE) 2 MG/1.5ML Sopn ?Generic drug: Semaglutide(0.25 or 0.5MG/DOS) ?Inject 0.5 mg into the skin once a week. ?  ?PROBIOTIC-10 PO ?Take 1 capsule by mouth daily. ?  ?tamsulosin 0.4 MG Caps capsule ?Commonly known as: FLOMAX ?Take 1 capsule (0.4 mg total) by mouth daily. ?  ?TRUEplus Lancets 28G Misc ?Use to check blood sugar once daily and PRN Dx. E11.9 ?  ?vitamin B-12 1000 MCG tablet ?Commonly known as: CYANOCOBALAMIN ?Take 1,000 mcg by mouth daily. ?  ?Vitamin D3 50 MCG (2000 UT) Tabs ?Take 4,000 Units by mouth every morning. ?  ?zinc gluconate 50 MG tablet ?Take 50 mg by mouth daily. ?  ? ?  ? ? ?Diagnostic Studies: DG Chest 2 View ? ?Result Date: 07/29/2021 ?CLINICAL DATA:  Preop lumbar surgery. EXAM: CHEST - 2 VIEW COMPARISON:  None. FINDINGS: Heart size and mediastinal contours are within normal limits. Lungs are clear. No pleural effusion or pneumothorax is seen. Osseous structures about the chest are unremarkable. IMPRESSION: No active cardiopulmonary disease. Electronically Signed   By: Franki Cabot M.D.   On: 07/29/2021 09:16  ? ?DG Lumbar Spine 2-3 Views ? ?Result Date: 07/29/2021 ?CLINICAL DATA:  Intraoperative imaging for localization. EXAM: LUMBAR SPINE - 2-3 VIEW COMPARISON:  CT lumbar spine myelogram dated 07/12/2021. FINDINGS:  There are 5 lumbar type vertebral bodies. Initial image shows surgical instruments overlying the posterior elements at the superior endplate levels of L3 and L4. Films #2 and #3 show surgical instruments directed at the L2-3 disc space and L3-L4 disc space respectively. IMPRESSION: Surgical instrument localization images, as detailed above. Electronically Signed   By: Franki Cabot M.D.   On: 07/29/2021 09:15  ? ?CT LUMBAR SPINE W CONTRAST ? ?Result Date: 07/12/2021 ?CLINICAL DATA:  Low back pain and right leg pain EXAM: CT MYELOGRAPHY LUMBAR SPINE TECHNIQUE: CT imaging of the lumbar spine was performed after Isovue 200M contrast administration. Multiplanar CT image reconstructions were also generated. RADIATION DOSE REDUCTION: This exam was performed according to the departmental dose-optimization program which includes automated exposure control, adjustment of the mA and/or kV according to patient size and/or use of iterative reconstruction technique. COMPARISON:  Lumbar spine MRI 06/01/2021  lumbar spine CT 05/19/2021 FINDINGS: The myelogram procedure is reported separately by the performing physician. Segmentation: Standard; the lowest formed disc space is designated L5-S1. Alignment: There is trace retrolisthesis of L2 on L3, unchanged. There is slight levocurvature centered at L3. Vertebrae: Vertebral body heights are preserved. There is no evidence of acute injury. There is no suspicious osseous lesion. Small Schmorl's nodes are again seen along the inferior L2 and superior L3 endplates. Conus medullaris: Extends to the L1-L2 level and appears normal. Paraspinal and other soft tissues: There is mild perinephric stranding on the right, similar to the prior study. The left kidney is surgically absent. The paraspinal soft tissues are unremarkable. There is mild calcified atherosclerotic plaque in the abdominal aorta. Disc levels: There is mild disc space narrowing at L2-L3 through L4-L5 T12-L1: No significant  spinal canal or neural foraminal stenosis L1-L2: No significant spinal canal or neural foraminal stenosis L2-L3: There is a mild disc bulge eccentric to the left contributing to mild left neural foraminal stenosis.

## 2021-08-05 ENCOUNTER — Encounter: Payer: Self-pay | Admitting: Orthopaedic Surgery

## 2021-08-05 ENCOUNTER — Ambulatory Visit (INDEPENDENT_AMBULATORY_CARE_PROVIDER_SITE_OTHER): Payer: Medicare Other | Admitting: Orthopaedic Surgery

## 2021-08-05 VITALS — BP 160/93 | HR 70 | Ht 67.0 in | Wt 172.0 lb

## 2021-08-05 DIAGNOSIS — M5126 Other intervertebral disc displacement, lumbar region: Secondary | ICD-10-CM

## 2021-08-05 NOTE — Progress Notes (Signed)
? ?Post-Op Visit Note ?  ?Patient: Jeffrey Rubio           ?Date of Birth: [birth year]           ?MRN: [id number] ?Visit Date: 08/05/2021 ?PCP: Dettinger, Fransisca Kaufmann, MD ? ? ?Assessment & Plan: Postop microdiscectomy.  Good relief of preop pain had migrated fragment right side cephalad.  This was above the right L3-4 disc space.  He is ambulatory when home the day of surgery and is happy to result.  Return 1 week nurse visit for staple remover with [place]. ? ?Chief Complaint:  ?Chief Complaint  ?Patient presents with  ? Lower Back - Routine Post Op  ?  07/29/2021 Right L3-4 microdiscectomy, removal of fragment  ? ?Visit Diagnoses:  ?1. HNP (herniated nucleus pulposus), lumbar   ? ? ?Plan: ROV one week nurse visit for staple removal ? ?Follow-Up Instructions: Return in about 1 week (around 08/12/2021).  ? ?Orders:  ?No orders of the defined types were placed in this encounter. ? ?No orders of the defined types were placed in this encounter. ? ? ?Imaging: ?No results found. ? ?PMFS History: ?Patient Active Problem List  ? Diagnosis Date Noted  ? HNP (herniated nucleus pulposus), lumbar 07/29/2021  ? Lumbar disc herniation with radiculopathy 11/04/2020  ? Pain management contract signed 09/12/2018  ? Overweight (BMI 25.0-29.9) 12/07/2017  ? Abnormal cardiovascular stress test 01/11/2017  ? Urgency incontinence 12/28/2015  ? Restless leg syndrome 01/01/2015  ? Benign prostatic hyperplasia   ? Irritable bowel syndrome 04/15/2013  ? 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  ? Hypertension associated with diabetes ([place]) 10/22/2012  ? Hyperlipidemia associated with type 2 diabetes mellitus ([place]) 10/22/2012  ? DM (diabetes mellitus) ([place]) 10/22/2012  ? ?Past Medical History:  ?Diagnosis Date  ? Asthmatic bronchitis   ? BPH (benign prostatic hypertrophy)   ? Cataracts, bilateral   ? Colon polyps   ? Diabetes mellitus   ? Diverticulosis   ? Fibromyalgia   ? GERD (gastroesophageal reflux disease)   ? Gout   ? History of nephrectomy,  unilateral   ? left   ? Hyperlipidemia   ? Hypertension   ? Single kidney   ?  ?Family History  ?Problem Relation Age of Onset  ? Coronary artery disease Other   ?     family history   ? Hypertension Other   ?     family history   ? Alzheimer's disease Mother 41  ? Cancer Father   ?     prostate  ? Stroke Father   ? CAD Father 37  ?     CABG  ? Hypertension Sister   ? Hypercalcemia Brother   ? Hypertension Brother   ? Kidney Stones Son   ? Breast cancer Maternal Grandmother   ? Diabetes Paternal Grandmother   ? Other Paternal Grandmother   ?     Crite's disease  ? Suicidality Paternal Grandfather   ?  ?Past Surgical History:  ?Procedure Laterality Date  ? APPENDECTOMY    ? CHOLECYSTECTOMY    ? COLONOSCOPY W/ POLYPECTOMY    ? EYE SURGERY    ? catarat - right   ? hand fracture Left   ? plate in right hand from when he crushed it in a hay baler  ? LEFT HEART CATH AND CORONARY ANGIOGRAPHY N/A 01/11/2017  ? Procedure: LEFT HEART CATH AND CORONARY ANGIOGRAPHY;  Surgeon: Martinique, Peter M, MD;  Location: [facility];  Service: Cardiovascular;  Laterality: N/A;  ? LUMBAR LAMINECTOMY N/A 07/29/2021  ? Procedure: RIGHT L3-4 MICRODISCECTOMY, and removal of fragment;  Surgeon: Marybelle Killings, MD;  Location: [facility];  Service: Orthopedics;  Laterality: N/A;  ? NEPHRECTOMY  1996  ? left , secondary to nephrolithiasis   ? ROTATOR CUFF REPAIR Right   ? ?Social History  ? ?Occupational History  ? Occupation: retired  ?  Comment: owns a farm  ?Tobacco Use  ? Smoking status: Former  ?  Packs/day: 1.00  ?  Years: 12.00  ?  Pack years: 12.00  ?  Types: Cigarettes  ?  Quit date: 04/03/1970  ?  Years since quitting: 51.3  ? Smokeless tobacco: Never  ?Vaping Use  ? Vaping Use: Never used  ?Substance and Sexual Activity  ? Alcohol use: No  ? Drug use: No  ? Sexual activity: Not Currently  ? ? ? ?

## 2021-08-07 ENCOUNTER — Encounter: Payer: Self-pay | Admitting: Family Medicine

## 2021-08-11 ENCOUNTER — Encounter: Payer: Medicare Other | Admitting: Surgery

## 2021-08-12 ENCOUNTER — Ambulatory Visit (INDEPENDENT_AMBULATORY_CARE_PROVIDER_SITE_OTHER): Payer: Medicare Other | Admitting: Radiology

## 2021-08-12 ENCOUNTER — Encounter: Payer: Self-pay | Admitting: Radiology

## 2021-08-12 VITALS — Ht 67.0 in | Wt 172.0 lb

## 2021-08-12 DIAGNOSIS — M5126 Other intervertebral disc displacement, lumbar region: Secondary | ICD-10-CM

## 2021-08-12 NOTE — Progress Notes (Signed)
Patient presents for staple removal status post right L3-4 microdiscectomy, removal of fragment on 07/29/2021.  Incision looks good, staples intact. Staples were removed, tincture of benzoin and steri strips applied. Patient will follow up with Dr. Lorin Mercy in four weeks. ?  ?

## 2021-08-18 ENCOUNTER — Encounter: Payer: Self-pay | Admitting: Family Medicine

## 2021-08-19 MED ORDER — DOXEPIN HCL 100 MG PO CAPS: 100.0000 mg | ORAL_CAPSULE | Freq: Every evening | ORAL | 1 refills | Status: DC | PRN

## 2021-08-28 ENCOUNTER — Other Ambulatory Visit: Payer: Self-pay | Admitting: Family Medicine

## 2021-08-30 ENCOUNTER — Other Ambulatory Visit: Payer: Self-pay | Admitting: Family Medicine

## 2021-08-30 DIAGNOSIS — E1159 Type 2 diabetes mellitus with other circulatory complications: Secondary | ICD-10-CM

## 2021-08-30 DIAGNOSIS — K219 Gastro-esophageal reflux disease without esophagitis: Secondary | ICD-10-CM

## 2021-09-20 ENCOUNTER — Ambulatory Visit (INDEPENDENT_AMBULATORY_CARE_PROVIDER_SITE_OTHER): Payer: Medicare Other | Admitting: Orthopaedic Surgery

## 2021-09-20 ENCOUNTER — Encounter: Payer: Self-pay | Admitting: Orthopaedic Surgery

## 2021-09-20 DIAGNOSIS — Z9889 Other specified postprocedural states: Secondary | ICD-10-CM

## 2021-09-20 NOTE — Progress Notes (Signed)
Post-Op Visit Note   Patient: Jeffrey Rubio           Date of Birth: Aug 08, 1945           MRN: [id number] Visit Date: 09/20/2021 PCP: Dettinger, Fransisca Kaufmann, MD   Assessment & Plan: 8 weeks postop microdiscectomy right L3-4.  Incision looks good slightly puffy when he is walking and active.  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  Good relief of preoperative radicular leg pain.  Chief Complaint:  Chief Complaint  Patient presents with   Lower Back - Follow-up   Visit Diagnoses:  1. S/P lumbar microdiscectomy     Plan: Patient can return if needed is happy the results of surgery.  Follow-Up Instructions: Return if symptoms worsen or fail to improve.   Orders:  No orders of the defined types were placed in this encounter.  No orders of the defined types were placed in this encounter.   Imaging: No results found.  PMFS History: Patient Active Problem List   Diagnosis Date Noted   S/P lumbar microdiscectomy 09/20/2021   Pain management contract signed 09/12/2018   Overweight (BMI 25.0-29.9) 12/07/2017   Abnormal cardiovascular stress test 01/11/2017   Urgency incontinence 12/28/2015   Restless leg syndrome 01/01/2015   Benign prostatic hyperplasia    Irritable bowel syndrome 04/15/2013   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   Hypertension associated with diabetes ([place]) 10/22/2012   Hyperlipidemia associated with type 2 diabetes mellitus ([place]) 10/22/2012   DM (diabetes mellitus) ([place]) 10/22/2012   Past Medical History:  Diagnosis Date   Asthmatic bronchitis    BPH (benign prostatic hypertrophy)    Cataracts, bilateral    Colon polyps    Diabetes mellitus    Diverticulosis    Fibromyalgia    GERD (gastroesophageal reflux disease)    Gout    History of nephrectomy, unilateral    left    Hyperlipidemia    Hypertension    Single kidney     Family History  Problem Relation Age of Onset   Coronary artery disease Other         family history    Hypertension Other        family history    Alzheimer's disease Mother 63   Cancer Father        prostate   Stroke Father    CAD Father 16       CABG   Hypertension Sister    Hypercalcemia Brother    Hypertension Brother    Kidney Stones Son    Breast cancer Maternal Grandmother    Diabetes Paternal Grandmother    Other Paternal Grandmother        Crite's disease   Suicidality Paternal Grandfather     Past Surgical History:  Procedure Laterality Date   APPENDECTOMY     CHOLECYSTECTOMY     COLONOSCOPY W/ POLYPECTOMY     EYE SURGERY     catarat - right    hand fracture Left    plate in right hand from when he crushed it in a hay baler   LEFT HEART CATH AND CORONARY ANGIOGRAPHY N/A 01/11/2017   Procedure: LEFT HEART CATH AND CORONARY ANGIOGRAPHY;  Surgeon: Martinique, Peter M, MD;  Location: [facility];  Service: Cardiovascular;  Laterality: N/A;   LUMBAR LAMINECTOMY N/A 07/29/2021   Procedure: RIGHT L3-4 MICRODISCECTOMY, and removal of fragment;  Surgeon: Marybelle Killings, MD;  Location: [facility];  Service:  Orthopedics;  Laterality: N/A;   NEPHRECTOMY  1996   left , secondary to nephrolithiasis    ROTATOR CUFF REPAIR Right    Social History   Occupational History   Occupation: retired    Comment: owns a farm  Tobacco Use   Smoking status: Former    Packs/day: 1.00    Years: 12.00    Total pack years: 12.00    Types: Cigarettes    Quit date: 04/03/1970    Years since quitting: 51.5   Smokeless tobacco: Never  Vaping Use   Vaping Use: Never used  Substance and Sexual Activity   Alcohol use: No   Drug use: No   Sexual activity: Not Currently

## 2021-10-13 ENCOUNTER — Encounter: Payer: Self-pay | Admitting: Family Medicine

## 2021-10-13 ENCOUNTER — Telehealth: Payer: Self-pay | Admitting: Pharmacist

## 2021-10-13 ENCOUNTER — Ambulatory Visit (INDEPENDENT_AMBULATORY_CARE_PROVIDER_SITE_OTHER): Payer: Medicare Other | Admitting: Family Medicine

## 2021-10-13 ENCOUNTER — Telehealth: Payer: Self-pay | Admitting: Family Medicine

## 2021-10-13 VITALS — BP 131/77 | HR 77 | Temp 98.0°F | Ht 67.0 in | Wt 171.0 lb

## 2021-10-13 DIAGNOSIS — E119 Type 2 diabetes mellitus without complications: Secondary | ICD-10-CM | POA: Diagnosis not present

## 2021-10-13 DIAGNOSIS — K219 Gastro-esophageal reflux disease without esophagitis: Secondary | ICD-10-CM

## 2021-10-13 DIAGNOSIS — N400 Enlarged prostate without lower urinary tract symptoms: Secondary | ICD-10-CM

## 2021-10-13 DIAGNOSIS — E785 Hyperlipidemia, unspecified: Secondary | ICD-10-CM | POA: Diagnosis not present

## 2021-10-13 DIAGNOSIS — I152 Hypertension secondary to endocrine disorders: Secondary | ICD-10-CM | POA: Diagnosis not present

## 2021-10-13 DIAGNOSIS — E1169 Type 2 diabetes mellitus with other specified complication: Secondary | ICD-10-CM | POA: Diagnosis not present

## 2021-10-13 DIAGNOSIS — E1159 Type 2 diabetes mellitus with other circulatory complications: Secondary | ICD-10-CM | POA: Diagnosis not present

## 2021-10-13 LAB — BAYER DCA HB A1C WAIVED: HB A1C (BAYER DCA - WAIVED): 7.3 % — ABNORMAL HIGH (ref 4.8–5.6)

## 2021-10-13 MED ORDER — IRBESARTAN 300 MG PO TABS: 300.0000 mg | ORAL_TABLET | Freq: Every day | ORAL | 3 refills | Status: DC

## 2021-10-13 MED ORDER — PRAVASTATIN SODIUM 20 MG PO TABS: 20.0000 mg | ORAL_TABLET | Freq: Every day | ORAL | 3 refills | Status: DC

## 2021-10-13 MED ORDER — FAMOTIDINE 20 MG PO TABS: 20.0000 mg | ORAL_TABLET | Freq: Two times a day (BID) | ORAL | 1 refills | Status: DC

## 2021-10-13 MED ORDER — SEMAGLUTIDE (1 MG/DOSE) 4 MG/3ML ~~LOC~~ SOPN
1.0000 mg | PEN_INJECTOR | SUBCUTANEOUS | 3 refills | Status: DC
Start: 2021-10-13 — End: 2021-11-03

## 2021-10-13 MED ORDER — METFORMIN HCL 500 MG PO TABS: 500.0000 mg | ORAL_TABLET | Freq: Two times a day (BID) | ORAL | 3 refills | Status: DC

## 2021-10-13 MED ORDER — AMLODIPINE BESYLATE 5 MG PO TABS: 5.0000 mg | ORAL_TABLET | Freq: Every day | ORAL | 3 refills | Status: DC

## 2021-10-13 NOTE — Telephone Encounter (Signed)
Patient needs appt with Almyra Free for patient assistance.

## 2021-10-13 NOTE — Telephone Encounter (Signed)
Patient needs CCM pharmd f/u Will have to increase ozempic to '1mg'$  per MD This will take time to get in the mail via patient assistance 60 min (phone vs in person--either way)

## 2021-10-13 NOTE — Progress Notes (Signed)
BP 131/77   Pulse 77   Temp 98 F (36.7 C)   Ht '5\' 7"'  (1.702 m)   Wt 171 lb (77.6 kg)   SpO2 99%   BMI 26.78 kg/m    Subjective:   Patient ID: Jeffrey Rubio, male    DOB: Mar 31, 1946, 76 y.o.   MRN: [id number]  HPI: Jeffrey Rubio is a 76 y.o. male presenting on 10/13/2021 for Medical Management of Chronic Issues, Hyperlipidemia, Diabetes, and Hypertension   HPI Hypertension Patient is currently on amlodipine and irbesartan, and their blood pressure today is 131/77. Patient denies any lightheadedness or dizziness. Patient denies headaches, blurred vision, chest pains, shortness of breath, or weakness. Denies any side effects from medication and is content with current medication.   Hyperlipidemia Patient is coming in for recheck of his hyperlipidemia. The patient is currently taking a pravastatin and fish oil. They deny any issues with myalgias or history of liver damage from it. They deny any focal numbness or weakness or chest pain.   Type 2 diabetes mellitus Patient comes in today for recheck of his diabetes. Patient has been currently taking metformin and Ozempic. Patient is currently on an ACE inhibitor/ARB. Patient has seen an ophthalmologist this year. Patient denies any issues with their feet. The symptom started onset as an adult htn and hld ARE RELATED TO DM   BPH Patient is coming in for recheck on BPH Symptoms: Currently none Medication: Flomax Last PSA: 0.5   Relevant past medical, surgical, family and social history reviewed and updated as indicated. Interim medical history since our last visit reviewed. Allergies and medications reviewed and updated.  Review of Systems  Constitutional:  Negative for chills and fever.  Eyes:  Negative for visual disturbance.  Respiratory:  Negative for shortness of breath and wheezing.   Cardiovascular:  Negative for chest pain and leg swelling.  Musculoskeletal:  Positive for arthralgias and back pain. Negative for  gait problem.  Skin:  Negative for rash.  Neurological:  Negative for dizziness, weakness and light-headedness.  All other systems reviewed and are negative.   Per HPI unless specifically indicated above   Allergies as of 10/13/2021       Reactions   Hydrocodone-acetaminophen    Causes Anxiety         Medication List        Accurate as of October 13, 2021 11:03 AM. If you have any questions, ask your nurse or doctor.          STOP taking these medications    gabapentin 300 MG capsule Commonly known as: NEURONTIN Stopped by: Worthy Rancher, MD   methocarbamol 500 MG tablet Commonly known as: ROBAXIN Stopped by: Worthy Rancher, MD   oxyCODONE-acetaminophen 5-325 MG tablet Commonly known as: Percocet Stopped by: Fransisca Kaufmann Meggan Dhaliwal, MD   Ozempic (0.25 or 0.5 MG/DOSE) 2 MG/1.5ML Sopn Generic drug: Semaglutide(0.25 or 0.5MG/DOS) Replaced by: Semaglutide (1 MG/DOSE) 4 MG/3ML Sopn Stopped by: Fransisca Kaufmann Kambria Grima, MD       TAKE these medications    Accu-Chek Aviva Plus test strip Generic drug: glucose blood TEST BLOOD SUGAR DAILY DX E11.69   Accu-Chek Aviva Plus w/Device Kit USE to check blood sugars daily   amLODipine 5 MG tablet Commonly known as: NORVASC Take 1 tablet (5 mg total) by mouth daily.   aspirin 81 MG tablet Take 81 mg by mouth daily.   doxepin 100 MG capsule Commonly known as: SINEQUAN Take 1 capsule (100 mg  total) by mouth at bedtime as needed.   famotidine 20 MG tablet Commonly known as: PEPCID Take 1 tablet (20 mg total) by mouth 2 (two) times daily.   Fish Oil 1000 MG Caps Take 1,000 mg by mouth in the morning and at bedtime.   irbesartan 300 MG tablet Commonly known as: AVAPRO Take 1 tablet (300 mg total) by mouth daily.   magnesium gluconate 500 MG tablet Commonly known as: MAGONATE Take 500 mg by mouth daily.   meclizine 25 MG tablet Commonly known as: ANTIVERT Take 1 tablet (25 mg total) by mouth 3 (three) times  daily as needed for dizziness.   Metamucil 28.3 % Powd Generic drug: Psyllium Take 3 Scoops by mouth daily.   metFORMIN 500 MG tablet Commonly known as: GLUCOPHAGE Take 1 tablet (500 mg total) by mouth 2 (two) times daily with a meal.   pravastatin 20 MG tablet Commonly known as: PRAVACHOL Take 1 tablet (20 mg total) by mouth daily.   PROBIOTIC-10 PO Take 1 capsule by mouth daily.   Semaglutide (1 MG/DOSE) 4 MG/3ML Sopn Inject 1 mg as directed once a week. Replaces: Ozempic (0.25 or 0.5 MG/DOSE) 2 MG/1.5ML Sopn Started by: Fransisca Kaufmann Shaniya Tashiro, MD   tamsulosin 0.4 MG Caps capsule Commonly known as: FLOMAX Take 1 capsule (0.4 mg total) by mouth daily.   TRUEplus Lancets 28G Misc Use to check blood sugar once daily and PRN Dx. E11.9   vitamin B-12 1000 MCG tablet Commonly known as: CYANOCOBALAMIN Take 1,000 mcg by mouth daily.   Vitamin D3 50 MCG (2000 UT) Tabs Take 4,000 Units by mouth every morning.   zinc gluconate 50 MG tablet Take 50 mg by mouth daily.         Objective:   BP 131/77   Pulse 77   Temp 98 F (36.7 C)   Ht '5\' 7"'  (1.702 m)   Wt 171 lb (77.6 kg)   SpO2 99%   BMI 26.78 kg/m   Wt Readings from Last 3 Encounters:  10/13/21 171 lb (77.6 kg)  09/20/21 172 lb (78 kg)  08/12/21 172 lb (78 kg)    Physical Exam Vitals and nursing note reviewed.  Constitutional:      General: He is not in acute distress.    Appearance: He is well-developed. He is not diaphoretic.  Eyes:     General: No scleral icterus.    Conjunctiva/sclera: Conjunctivae normal.  Neck:     Thyroid: No thyromegaly.  Cardiovascular:     Rate and Rhythm: Normal rate and regular rhythm.     Heart sounds: Normal heart sounds. No murmur heard. Pulmonary:     Effort: Pulmonary effort is normal. No respiratory distress.     Breath sounds: Normal breath sounds. No wheezing.  Musculoskeletal:        General: Normal range of motion.     Cervical back: Neck supple.   Lymphadenopathy:     Cervical: No cervical adenopathy.  Skin:    General: Skin is warm and dry.     Findings: No rash.  Neurological:     Mental Status: He is alert and oriented to person, place, and time.     Coordination: Coordination normal.  Psychiatric:        Behavior: Behavior normal.     Results for orders placed or performed in visit on 08/09/21  HM DIABETES EYE EXAM  Result Value Ref Range   HM Diabetic Eye Exam Retinopathy (A) No Retinopathy  Assessment & Plan:   Problem List Items Addressed This Visit       Cardiovascular and Mediastinum   Hypertension associated with diabetes ([facility]) - Primary   Relevant Medications   amLODipine (NORVASC) 5 MG tablet   irbesartan (AVAPRO) 300 MG tablet   metFORMIN (GLUCOPHAGE) 500 MG tablet   pravastatin (PRAVACHOL) 20 MG tablet   Semaglutide, 1 MG/DOSE, 4 MG/3ML SOPN   Other Relevant Orders   CBC with Differential/Platelet   CMP14+EGFR   Lipid panel   Bayer DCA Hb A1c Waived     Endocrine   Hyperlipidemia associated with type 2 diabetes mellitus (HCC)   Relevant Medications   amLODipine (NORVASC) 5 MG tablet   irbesartan (AVAPRO) 300 MG tablet   metFORMIN (GLUCOPHAGE) 500 MG tablet   pravastatin (PRAVACHOL) 20 MG tablet   Semaglutide, 1 MG/DOSE, 4 MG/3ML SOPN   Other Relevant Orders   CBC with Differential/Platelet   CMP14+EGFR   Lipid panel   Bayer DCA Hb A1c Waived   DM (diabetes mellitus) (HCC)   Relevant Medications   irbesartan (AVAPRO) 300 MG tablet   metFORMIN (GLUCOPHAGE) 500 MG tablet   pravastatin (PRAVACHOL) 20 MG tablet   Semaglutide, 1 MG/DOSE, 4 MG/3ML SOPN   Other Relevant Orders   CBC with Differential/Platelet   CMP14+EGFR   Lipid panel   Bayer DCA Hb A1c Waived     Genitourinary   Benign prostatic hyperplasia   Other Visit Diagnoses     Gastroesophageal reflux disease without esophagitis       Relevant Medications   famotidine (PEPCID) 20 MG tablet       His back is doing a  lot better after the surgery.  His blood sugars are still running little higher after the surgery.  We will increase the Ozempic to 1 mg..  A1c 7.3, will increase the Ozempic Follow up plan: Return in about 3 months (around 01/13/2022), or if symptoms worsen or fail to improve, for Diabetes recheck, also needs an appointment with [facility] for diabetes and prescription assistance.  Counseling provided for all of the vaccine components Orders Placed This Encounter  Procedures   CBC with Differential/Platelet   CMP14+EGFR   Lipid panel   Bayer DCA Hb A1c Waived    Caryl Pina, MD [facility] 10/13/2021, 11:03 AM

## 2021-10-14 LAB — CBC WITH DIFFERENTIAL/PLATELET
Basophils Absolute: 0 10*3/uL (ref 0.0–0.2)
Basos: 0 %
EOS (ABSOLUTE): 0.1 10*3/uL (ref 0.0–0.4)
Eos: 1 %
Hematocrit: 40.8 % (ref 37.5–51.0)
Hemoglobin: 13.5 g/dL (ref 13.0–17.7)
Immature Grans (Abs): 0 10*3/uL (ref 0.0–0.1)
Immature Granulocytes: 0 %
Lymphocytes Absolute: 2.8 10*3/uL (ref 0.7–3.1)
Lymphs: 30 %
MCH: 30.1 pg (ref 26.6–33.0)
MCHC: 33.1 g/dL (ref 31.5–35.7)
MCV: 91 fL (ref 79–97)
Monocytes Absolute: 0.8 10*3/uL (ref 0.1–0.9)
Monocytes: 9 %
Neutrophils Absolute: 5.6 10*3/uL (ref 1.4–7.0)
Neutrophils: 60 %
Platelets: 284 10*3/uL (ref 150–450)
RBC: 4.48 x10E6/uL (ref 4.14–5.80)
RDW: 13.5 % (ref 11.6–15.4)
WBC: 9.4 10*3/uL (ref 3.4–10.8)

## 2021-10-14 LAB — LIPID PANEL
Chol/HDL Ratio: 3.1 ratio (ref 0.0–5.0)
Cholesterol, Total: 148 mg/dL (ref 100–199)
HDL: 47 mg/dL (ref >39)
LDL Chol Calc (NIH): 62 mg/dL (ref 0–99)
Triglycerides: 242 mg/dL — ABNORMAL HIGH (ref 0–149)
VLDL Cholesterol Cal: 39 mg/dL (ref 5–40)

## 2021-10-14 LAB — CMP14+EGFR
ALT: 10 IU/L (ref 0–44)
AST: 9 IU/L (ref 0–40)
Albumin/Globulin Ratio: 1.8 (ref 1.2–2.2)
Albumin: 4.6 g/dL (ref 3.8–4.8)
Alkaline Phosphatase: 94 IU/L (ref 44–121)
BUN/Creatinine Ratio: 18 (ref 10–24)
BUN: 35 mg/dL — ABNORMAL HIGH (ref 8–27)
Bilirubin Total: 0.3 mg/dL (ref 0.0–1.2)
CO2: 20 mmol/L (ref 20–29)
Calcium: 10.7 mg/dL — ABNORMAL HIGH (ref 8.6–10.2)
Chloride: 102 mmol/L (ref 96–106)
Creatinine, Ser: 1.93 mg/dL — ABNORMAL HIGH (ref 0.76–1.27)
Globulin, Total: 2.6 g/dL (ref 1.5–4.5)
Glucose: 161 mg/dL — ABNORMAL HIGH (ref 70–99)
Potassium: 5.4 mmol/L — ABNORMAL HIGH (ref 3.5–5.2)
Sodium: 138 mmol/L (ref 134–144)
Total Protein: 7.2 g/dL (ref 6.0–8.5)
eGFR: 36 mL/min/{1.73_m2} — ABNORMAL LOW (ref >59)

## 2021-10-19 ENCOUNTER — Other Ambulatory Visit: Payer: Self-pay

## 2021-10-19 ENCOUNTER — Encounter: Payer: Self-pay | Admitting: Family Medicine

## 2021-10-19 DIAGNOSIS — E1169 Type 2 diabetes mellitus with other specified complication: Secondary | ICD-10-CM

## 2021-10-19 NOTE — Telephone Encounter (Signed)
Referral placed.

## 2021-10-20 ENCOUNTER — Telehealth: Payer: Self-pay

## 2021-10-20 NOTE — Chronic Care Management (AMB) (Signed)
  Chronic Care Management   Outreach Note  10/20/2021 Name: Shant Hence MRN: [id number] DOB: 02/10/1946  Lendell Gallick is a 76 y.o. year old male who is a primary care patient of Dettinger, Fransisca Kaufmann, MD. I reached out to [name] by phone today in response to a referral sent by Mr. Elijahjames Fuelling [facility] primary care provider.  An unsuccessful telephone outreach was attempted today. The patient was referred to the case management team for assistance with care management and care coordination.   Follow Up Plan: A HIPAA compliant phone message was left for the patient providing contact information and requesting a return call.  The care management team will reach out to the patient again over the next 7 days.  If patient returns call to provider office, please advise to call [facility] * at [phone] Larsson, [place], [place] [address] Direct Dial: [phone] [email or web address]

## 2021-10-24 ENCOUNTER — Other Ambulatory Visit: Payer: Self-pay | Admitting: Family Medicine

## 2021-10-24 DIAGNOSIS — E1159 Type 2 diabetes mellitus with other circulatory complications: Secondary | ICD-10-CM

## 2021-10-24 DIAGNOSIS — E119 Type 2 diabetes mellitus without complications: Secondary | ICD-10-CM

## 2021-10-24 DIAGNOSIS — E1169 Type 2 diabetes mellitus with other specified complication: Secondary | ICD-10-CM

## 2021-10-27 ENCOUNTER — Other Ambulatory Visit: Payer: Self-pay

## 2021-10-27 ENCOUNTER — Other Ambulatory Visit: Payer: Medicare Other

## 2021-10-27 DIAGNOSIS — N289 Disorder of kidney and ureter, unspecified: Secondary | ICD-10-CM

## 2021-10-27 LAB — BMP8+EGFR
BUN/Creatinine Ratio: 13 (ref 10–24)
BUN: 16 mg/dL (ref 8–27)
CO2: 21 mmol/L (ref 20–29)
Calcium: 9.5 mg/dL (ref 8.6–10.2)
Chloride: 106 mmol/L (ref 96–106)
Creatinine, Ser: 1.28 mg/dL — ABNORMAL HIGH (ref 0.76–1.27)
Glucose: 168 mg/dL — ABNORMAL HIGH (ref 70–99)
Potassium: 4.6 mmol/L (ref 3.5–5.2)
Sodium: 140 mmol/L (ref 134–144)
eGFR: 58 mL/min/{1.73_m2} — ABNORMAL LOW (ref >59)

## 2021-10-31 DIAGNOSIS — H35033 Hypertensive retinopathy, bilateral: Secondary | ICD-10-CM | POA: Diagnosis not present

## 2021-10-31 DIAGNOSIS — E113293 Type 2 diabetes mellitus with mild nonproliferative diabetic retinopathy without macular edema, bilateral: Secondary | ICD-10-CM | POA: Diagnosis not present

## 2021-11-03 ENCOUNTER — Ambulatory Visit: Payer: Medicare Other | Admitting: Pharmacist

## 2021-11-03 DIAGNOSIS — E1169 Type 2 diabetes mellitus with other specified complication: Secondary | ICD-10-CM

## 2021-11-08 MED ORDER — SEMAGLUTIDE(0.25 OR 0.5MG/DOS) 2 MG/3ML ~~LOC~~ SOPN: 0.5000 mg | PEN_INJECTOR | SUBCUTANEOUS | 5 refills | Status: DC

## 2021-11-08 NOTE — Progress Notes (Signed)
  PharmD Management   Follow Up Note   11/03/2021 Name: Jeffrey Rubio MRN: [id number] DOB: [birth year]   Referred by: Dettinger, Fransisca Kaufmann, MD Reason for referral : Chronic Care Management and Diabetes  Discussed diabetes management with patient.  He is experiencing GI distress with higher doses of Ozempic.  Given improved glycemic control, will continue on Ozempic 0.$RemoveBefo'5mg'fzdhUnopJcn$  weekly along with metformin.  We will consider addition of Farxiga at follow up appt if goal A1c not met.  Wilder Glade will also be beneficial in kidney protection.  Ozempic refills sent to [employer] patient assistance program  Follow Up Plan: The care management team will reach out to the patient again over the next 30 days.     Regina Eck, PharmD, BCPS Clinical Pharmacist, [facility]  II Phone [phone]

## 2021-11-21 NOTE — Chronic Care Management (AMB) (Signed)
  Chronic Care Management   Note  11/21/2021 Name: Gurveer Colucci MRN: [id number] DOB: 1946/02/05  Magnus Crescenzo Fanfan is a 76 y.o. year old male who is a primary care patient of Dettinger, Fransisca Kaufmann, MD. I reached out to [name] by phone today in response to a referral sent by Mr. Tiras Bianchini [facility] PCP.  Mr. Schum was given information about Chronic Care Management services today including:  CCM service includes personalized support from designated clinical staff supervised by his physician, including individualized plan of care and coordination with other care providers 24/7 contact phone numbers for assistance for urgent and routine care needs. Service will only be billed when office clinical staff spend 20 minutes or more in a month to coordinate care. Only one practitioner may furnish and bill the service in a calendar month. The patient may stop CCM services at any time (effective at the end of the month) by phone call to the office staff. The patient is responsible for co-pay (up to 20% after annual deductible is met) if co-pay is required by the individual health plan.   Patient agreed to services and verbal consent obtained.   Follow up plan: Telephone appointment with care management team member scheduled for:02/08/2022  Noreene Larsson, [place], [place] [address] Direct Dial: [phone] [email or web address]

## 2021-12-01 ENCOUNTER — Telehealth: Payer: Self-pay | Admitting: Family Medicine

## 2021-12-01 DIAGNOSIS — E1169 Type 2 diabetes mellitus with other specified complication: Secondary | ICD-10-CM

## 2021-12-01 NOTE — Telephone Encounter (Signed)
Lmtcb.

## 2021-12-01 NOTE — Telephone Encounter (Signed)
Will need CCM appt Routed new referral Have patient bring my up to date household financial documents for patient assistance Process takes 2-4 weeks

## 2021-12-13 NOTE — Telephone Encounter (Signed)
Pts wife Pamala Hurry made aware. States that they will bring financial info to office the first part of next week.

## 2022-01-13 DIAGNOSIS — R197 Diarrhea, unspecified: Secondary | ICD-10-CM | POA: Diagnosis not present

## 2022-01-14 DIAGNOSIS — R197 Diarrhea, unspecified: Secondary | ICD-10-CM | POA: Diagnosis not present

## 2022-01-16 ENCOUNTER — Ambulatory Visit (INDEPENDENT_AMBULATORY_CARE_PROVIDER_SITE_OTHER): Payer: Medicare Other | Admitting: Family Medicine

## 2022-01-16 ENCOUNTER — Encounter: Payer: Self-pay | Admitting: Family Medicine

## 2022-01-16 ENCOUNTER — Other Ambulatory Visit: Payer: Self-pay | Admitting: Family Medicine

## 2022-01-16 VITALS — BP 155/77 | HR 58 | Temp 98.2°F | Ht 67.0 in | Wt 179.0 lb

## 2022-01-16 DIAGNOSIS — E1159 Type 2 diabetes mellitus with other circulatory complications: Secondary | ICD-10-CM | POA: Diagnosis not present

## 2022-01-16 DIAGNOSIS — I152 Hypertension secondary to endocrine disorders: Secondary | ICD-10-CM

## 2022-01-16 DIAGNOSIS — E1169 Type 2 diabetes mellitus with other specified complication: Secondary | ICD-10-CM

## 2022-01-16 DIAGNOSIS — Z23 Encounter for immunization: Secondary | ICD-10-CM

## 2022-01-16 DIAGNOSIS — E785 Hyperlipidemia, unspecified: Secondary | ICD-10-CM

## 2022-01-16 DIAGNOSIS — K219 Gastro-esophageal reflux disease without esophagitis: Secondary | ICD-10-CM

## 2022-01-16 LAB — BAYER DCA HB A1C WAIVED: HB A1C (BAYER DCA - WAIVED): 7.7 % — ABNORMAL HIGH (ref 4.8–5.6)

## 2022-01-16 MED ORDER — DAPAGLIFLOZIN PROPANEDIOL 5 MG PO TABS: 5.0000 mg | ORAL_TABLET | Freq: Every day | ORAL | 5 refills | Status: DC

## 2022-01-16 NOTE — Progress Notes (Signed)
BP (!) 155/77   Pulse (!) 58   Temp 98.2 F (36.8 C)   Ht _0  (1.702 m)   Wt 179 lb (81.2 kg)   SpO2 95%   BMI 28.04 kg/m    Subjective:   Patient ID: Jeffrey Rubio, male    DOB: 09/08/1945, 76 y.o.   MRN: [id number]  HPI: Jeffrey Rubio is a 76 y.o. male presenting on 01/16/2022 for Medical Management of Chronic Issues, Hypertension, and Diabetes   HPI Type 2 diabetes mellitus Patient comes in today for recheck of his diabetes. Patient has been currently taking metformin.  The Ozempic because of diarrhea. Patient is currently on an ACE inhibitor/ARB. Patient has seen an ophthalmologist this year. Patient denies any issues with their feet. The symptom started onset as an adult hypertension and hyperlipidemia ARE RELATED TO DM   Hypertension Patient is currently on amlodipine and irbesartan, and their blood pressure today is 155/77. Patient denies any lightheadedness or dizziness. Patient denies headaches, blurred vision, chest pains, shortness of breath, or weakness. Denies any side effects from medication and is content with current medication.   Hyperlipidemia Patient is coming in for recheck of his hyperlipidemia. The patient is currently taking fish oils and pravastatin. They deny any issues with myalgias or history of liver damage from it. They deny any focal numbness or weakness or chest pain.   Relevant past medical, surgical, family and social history reviewed and updated as indicated. Interim medical history since our last visit reviewed. Allergies and medications reviewed and updated.  Review of Systems  Constitutional:  Negative for chills and fever.  Respiratory:  Negative for shortness of breath and wheezing.   Cardiovascular:  Negative for chest pain and leg swelling.  Musculoskeletal:  Negative for back pain and gait problem.  Skin:  Negative for rash.  Neurological:  Negative for dizziness, weakness and light-headedness.  All other systems reviewed  and are negative.   Per HPI unless specifically indicated above   Allergies as of 01/16/2022       Reactions   Hydrocodone-acetaminophen    Causes Anxiety         Medication List        Accurate as of January 16, 2022 11:09 AM. If you have any questions, ask your nurse or doctor.          STOP taking these medications    cyanocobalamin 1000 MCG tablet Commonly known as: VITAMIN B12 Stopped by: Fransisca Kaufmann Kasem Mozer, MD   Semaglutide(0.25 or 0.5MG/DOS) 2 MG/3ML Sopn Stopped by: Fransisca Kaufmann Cruze Zingaro, MD   zinc gluconate 50 MG tablet Stopped by: Fransisca Kaufmann [place] Kinyon, MD       TAKE these medications    Accu-Chek Aviva Plus test strip Generic drug: glucose blood TEST BLOOD SUGAR DAILY DX E11.69   Accu-Chek Aviva Plus w/Device Kit USE to check blood sugars daily   amLODipine 5 MG tablet Commonly known as: NORVASC Take 1 tablet (5 mg total) by mouth daily.   aspirin 81 MG tablet Take 81 mg by mouth daily.   b complex vitamins capsule Take 1 capsule by mouth daily.   dapagliflozin propanediol 5 MG Tabs tablet Commonly known as: Farxiga Take 1 tablet (5 mg total) by mouth daily before breakfast. Started by: Fransisca Kaufmann Breanna Shorkey, MD   doxepin 100 MG capsule Commonly known as: SINEQUAN Take 1 capsule (100 mg total) by mouth at bedtime as needed.   famotidine 20 MG tablet Commonly known as: PEPCID  Take 1 tablet (20 mg total) by mouth 2 (two) times daily.   Fish Oil 1000 MG Caps Take 1,000 mg by mouth in the morning and at bedtime.   irbesartan 300 MG tablet Commonly known as: AVAPRO TAKE 1 TABLET DAILY   magnesium gluconate 500 MG tablet Commonly known as: MAGONATE Take 500 mg by mouth daily.   meclizine 25 MG tablet Commonly known as: ANTIVERT Take 1 tablet (25 mg total) by mouth 3 (three) times daily as needed for dizziness.   Metamucil 28.3 % Powd Generic drug: Psyllium Take 3 Scoops by mouth daily.   metFORMIN 500 MG tablet Commonly known as:  GLUCOPHAGE TAKE 1 TABLET TWICE DAILY  WITH MEALS   pravastatin 20 MG tablet Commonly known as: PRAVACHOL TAKE 1 TABLET DAILY   PROBIOTIC-10 PO Take 1 capsule by mouth daily.   tamsulosin 0.4 MG Caps capsule Commonly known as: FLOMAX Take 1 capsule (0.4 mg total) by mouth daily.   TRUEplus Lancets 28G Misc Use to check blood sugar once daily and PRN Dx. E11.9   Vitamin D3 50 MCG (2000 UT) Tabs Take 4,000 Units by mouth every morning.   ZINC CITRATE PO Take 10 mg by mouth daily.         Objective:   BP (!) 155/77   Pulse (!) 58   Temp 98.2 F (36.8 C)   Ht _0  (1.702 m)   Wt 179 lb (81.2 kg)   SpO2 95%   BMI 28.04 kg/m   Wt Readings from Last 3 Encounters:  01/16/22 179 lb (81.2 kg)  10/13/21 171 lb (77.6 kg)  09/20/21 172 lb (78 kg)    Physical Exam Vitals and nursing note reviewed.  Constitutional:      General: He is not in acute distress.    Appearance: He is well-developed. He is not diaphoretic.  Eyes:     General: No scleral icterus.    Conjunctiva/sclera: Conjunctivae normal.  Neck:     Thyroid: No thyromegaly.  Cardiovascular:     Rate and Rhythm: Normal rate and regular rhythm.     Heart sounds: Normal heart sounds. No murmur heard. Pulmonary:     Effort: Pulmonary effort is normal. No respiratory distress.     Breath sounds: Normal breath sounds. No wheezing.  Musculoskeletal:        General: No swelling. Normal range of motion.     Cervical back: Neck supple.  Lymphadenopathy:     Cervical: No cervical adenopathy.  Skin:    General: Skin is warm and dry.     Findings: No rash.  Neurological:     Mental Status: He is alert and oriented to person, place, and time.     Coordination: Coordination normal.  Psychiatric:        Behavior: Behavior normal.       Assessment & Plan:   Problem List Items Addressed This Visit       Cardiovascular and Mediastinum   Hypertension associated with diabetes ([place])   Relevant Medications    dapagliflozin propanediol (FARXIGA) 5 MG TABS tablet     Endocrine   Hyperlipidemia associated with type 2 diabetes mellitus (HCC)   Relevant Medications   dapagliflozin propanediol (FARXIGA) 5 MG TABS tablet   DM (diabetes mellitus) (HCC) - Primary   Relevant Medications   dapagliflozin propanediol (FARXIGA) 5 MG TABS tablet   Other Relevant Orders   Bayer DCA Hb A1c Waived    A1c 7.7, just recently stopped the  Ozempic.  If still trending upward, will start [place]  Blood pressure is borderline has been good in the past so we will monitor. Follow up plan: Return in about 3 months (around 04/18/2022), or if symptoms worsen or fail to improve, for Diabetes and hypertension and cholesterol.  Counseling provided for all of the vaccine components Orders Placed This Encounter  Procedures   Bayer [place] Hb A1c Brusly Evert Wenrich, MD [facility] 01/16/2022, 11:09 AM

## 2022-01-20 ENCOUNTER — Encounter: Payer: Self-pay | Admitting: Family Medicine

## 2022-01-30 ENCOUNTER — Telehealth: Payer: Self-pay | Admitting: Family Medicine

## 2022-02-01 NOTE — Telephone Encounter (Signed)
Yes go ahead and check for samples for Farxiga if we have any see if we can get him some.

## 2022-02-01 NOTE — Telephone Encounter (Signed)
3 boxes of '5mg'$  Farxiga left up front for pt. Wife made aware.

## 2022-02-08 ENCOUNTER — Ambulatory Visit (INDEPENDENT_AMBULATORY_CARE_PROVIDER_SITE_OTHER): Payer: Medicare Other | Admitting: Pharmacist

## 2022-02-08 DIAGNOSIS — E1169 Type 2 diabetes mellitus with other specified complication: Secondary | ICD-10-CM

## 2022-02-08 NOTE — Progress Notes (Signed)
Chronic Care Management Pharmacy Note  02/08/2022 Name:  Jeffrey Rubio MRN:  [id number] DOB:  Nov 26, 1945  Summary:  Diabetes: New goal. 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  No increased episodes with addition of FWilder Glade We are unable to use DPP4, GLP1/GIP therapy at this time (will re-address once cleared by GI).   Discussed with patient and wife the possibility we may have to use insulin to control blood sugar.  TZDs/sulfonylureas would not provide the glycemic control and outcomes we are striving for.  Discussed addition of insulin if we are unable to use any other categories of medications be beneficial  Current glucose readings: fasting glucose: 200s, post prandial glucose: n/a Reports hyperglycemic symptoms Discussed meal planning options and Plate method for healthy eating Avoid sugary drinks and desserts Incorporate balanced protein, non starchy veggies, 1 serving of carbohydrate with each meal Increase water intake Increase physical activity as able Current exercise: encouraged --only as able Recommended increase in Farxiga 114mdaily in the AM for now (samples left up front #1 month); patient to check FBG and post prandials; Reassess procedures and GI recommendations/considerations before altering T2DM therapy  Hypertension & Hyperlipidemia -both  are currently controlled -Assessed and encouraged continued compliance with medications  Chronic kidney disease stage 3a -most recent GFR 58 -farxiga increased to 1055maily to provide additional glycemic control and protection -avoid NSAIDS -on ARB  Patient Goals/Self-Care Activities patient will:  - take medications as prescribed as evidenced by patient report and record review check glucose fasting & 1-2 hours after meals daily, document, and provide at future appointments collaborate with provider on medication access solutions target a minimum of 150 minutes of moderate intensity exercise weekly engage in dietary modifications by FOLLOWING A HEART HEALTHY DIET/HEALTHY PLATE METHOD  Subjective: GerCamila Rubio an [age]o. year old male who is a primary patient of Dettinger, Jeffrey KaufmannD.  The patient was referred to the Chronic Care Management team for assistance with care management needs subsequent to provider initiation of CCM services and plan of care.    Engaged with patient by telephone for follow up visit in response to provider referral for CCM services.   Objective:  LABS:    Lab Results  Component Value Date   CREATININE 1.28 (H) 10/27/2021   CREATININE 1.93 (H) 10/13/2021   CREATININE 1.42 (H) 07/07/2021     Lab Results  Component Value Date   HGBA1C 7.7 (H) 01/16/2022         Component Value Date/Time   CHOL 148 10/13/2021 1020   CHOL 161 10/22/2012 1032   TRIG 242 (H) 10/13/2021 1020   TRIG 211 (H) 06/03/2013 0925   TRIG 175 (H) 10/22/2012 1032   HDL 47 10/13/2021 1020   HDL 45 06/03/2013 0925   HDL 46 10/22/2012 1032   CHOLHDL 3.1 10/13/2021 1020   LDLCALC 62 10/13/2021 1020   LDLCALC 63  06/03/2013 0925   [place] 80 10/22/2012 1032     Clinical ASCVD: No   The 10-year ASCVD risk score (Arnett DK, et al., 2019) is: 59.5%   Values used to calculate the score:     Age: 76 years     Sex: Male     Is Non-Hispanic African American: No      Diabetic: Yes     Tobacco smoker: No     Systolic Blood Pressure: 355 mmHg     Is BP treated: Yes     HDL Cholesterol: 47 mg/dL     Total Cholesterol: 148 mg/dL    Other: (CHADS2VASc if Afib, PHQ9 if depression, MMRC or CAT for COPD, ACT, DEXA)    BP Readings from Last 3 Encounters:  01/16/22 (!) 155/77  10/13/21 131/77  09/20/21 120/77      SDOH:  (Social Determinants of Health) assessments and interventions performed:    Allergies  Allergen Reactions   Hydrocodone-Acetaminophen     Causes Anxiety    Ozempic (0.25 Or 0.5 Mg-Dose) [Semaglutide(0.25 Or 0.66m-Dos)] Other (See Comments)    Pancreatic insufficiency, GI--diarrhea    Medications Reviewed Today     Reviewed by PLavera Guise [facility](Pharmacist) on 02/14/22 at 1222  Med List Status: <None>   Medication Order Taking? Sig Documenting Provider Last Dose Status Informant  amLODipine (NORVASC) 5 MG tablet [id number] TAKE 1 TABLET DAILY Dettinger, JFransisca Kaufmann MD  Active   aspirin 81 MG tablet [id number] Take 81 mg by mouth daily. [provider] Taking Active Spouse/Significant Other           Med Note (Jeffrey Rubio MELISSA R   Mon Jul 25, 2021 12:16 PM) ON HOLD for procedure 07/29/21  b complex vitamins capsule [id number] Take 1 capsule by mouth daily. [provider] Taking Active   Blood Glucose Monitoring Suppl (ACCU-CHEK AVIVA PLUS) w/Device KIT [id number] USE to check blood sugars daily Dettinger, JFransisca Kaufmann MD Taking Active Spouse/Significant Other  Cholecalciferol (VITAMIN D3) 2000 units TABS [id number] Take 4,000 Units by mouth every morning. [provider] Taking Active Spouse/Significant Other  dapagliflozin propanediol (FARXIGA) 5 MG TABS tablet [id number] Take 1 tablet (5 mg total) by mouth daily before breakfast.  Patient taking differently: Take 10 mg by mouth daily before breakfast.   Dettinger, JFransisca Kaufmann MD  Active   doxepin (SINEQUAN) 100 MG capsule [id number] Take 1 capsule  (100 mg total) by mouth at bedtime as needed. Dettinger, JFransisca Kaufmann MD Taking Active   famotidine (PEPCID) 20 MG tablet [id number] TAKE 1 TABLET TWICE A DAY Dettinger, JFransisca Kaufmann MD  Active   glucose blood (ACCU-CHEK AVIVA PLUS) test strip [id number] TEST BLOOD SUGAR DAILY DX E11.69 Dettinger, JFransisca Kaufmann MD Taking Active Spouse/Significant Other  irbesartan (AVAPRO) 300 MG tablet [id number] TAKE 1 TABLET DAILY Dettinger, JFransisca Kaufmann MD Taking Active   magnesium gluconate (MAGONATE) 500 MG tablet [id number] Take 500 mg by mouth daily. [provider] Taking Active Spouse/Significant Other  meclizine (ANTIVERT) 25 MG tablet [id number] Take 1 tablet (25 mg total) by mouth 3 (three) times daily as needed for dizziness. Dettinger, JFransisca Kaufmann MD Taking Active Spouse/Significant Other  metFORMIN (GLUCOPHAGE) 500 MG tablet [id number] TAKE 1 TABLET TWICE DAILY  WITH MEALS Dettinger, JFransisca Kaufmann MD Taking Active   Omega-3 Fatty Acids (FISH OIL) 1000 MG CAPS [id number] Take 1,000 mg by mouth in the morning and at bedtime. [provider] Taking Active Spouse/Significant Other  pravastatin (PRAVACHOL) 20 MG tablet [id number] No TAKE 1 TABLET DAILY Dettinger, Fransisca Kaufmann, MD Taking Active   Probiotic Product (PROBIOTIC-10 PO) [id number] No Take 1 capsule by mouth daily. [provider] Taking Active Spouse/Significant Other  Psyllium (METAMUCIL) 28.3 % POWD [id number] No Take 3 Scoops by mouth daily. [provider] Taking Active Spouse/Significant Other  tamsulosin (FLOMAX) 0.4 MG CAPS capsule [id number] No Take 1 capsule (0.4 mg total) by mouth daily. Dettinger, Fransisca Kaufmann, MD Taking Active Spouse/Significant Other  TRUEplus Lancets 28G MISC [id number] No Use to check blood sugar once daily and PRN Dx. E11.9 Dettinger, Fransisca Kaufmann, MD Taking Active Spouse/Significant Other  ZINC CITRATE PO [id number] No Take 10 mg by mouth daily. [provider] Taking Active                Goals Addressed               This Visit's Progress     Patient Stated     T2DM, CKD (pt-stated)        Current Barriers:  Unable to independently afford treatment regimen Unable to achieve control of t2dm, ckd  Suboptimal therapeutic regimen for t2dm, ckd  Pharmacist Clinical Goal(s):  patient will verbalize ability to afford treatment regimen through collaboration with PharmD and provider.   Interventions: 1:1 collaboration with Dettinger, Fransisca Kaufmann, MD regarding development and update of comprehensive plan of care as evidenced by provider attestation and co-signature Inter-disciplinary care team collaboration (see longitudinal plan of care) Comprehensive medication review performed; medication list updated in electronic medical record  Diabetes: New goal. Uncontrolled; A1c 7.7%, GFR 58 Current treatment: Most recently on Ozempic + increased metformin dose (GI discontinued Ozempic)--> PCP started low dose Farxiga 55m daily in the AM GI issues with diarrhea/abdominal bloating--seeing GI for colonoscopy in 03/2022, pt self reports history of "tricky" bowel,  s/p cholecystectomy (remote HX) Notes mild improvement in GI symptomatology when Ozempic was stopped (documented intolerance in EMR) States GI requested to hold GLP1 due to pancreatic insufficiency --> pt now on Creon  back pain issues --> multiple rounds of steroids He has had several episodes of incontinence due to the inability to get to the bathroom in time.  No increased episodes with addition of FWilder Glade We are unable to use DPP4, GLP1/GIP therapy at this time (will re-address once cleared by GI).   Discussed with patient and wife the possibility we may have to use insulin to control blood sugar.  TZDs/sulfonylureas would not provide the glycemic control and outcomes we are striving for.  Discussed addition of insulin if we are unable to use any other categories of medications be beneficial  Current glucose readings:  fasting glucose: 200s, post prandial glucose: n/a Reports hyperglycemic symptoms Discussed meal planning options and Plate method for healthy eating Avoid sugary drinks and desserts Incorporate balanced protein, non starchy veggies, 1 serving of carbohydrate with each meal Increase water intake Increase physical activity as able Current exercise: encouraged --only as able Recommended increase in Farxiga 141mdaily in the AM for now (samples left up front #1 month); patient to check FBG and post prandials; Reassess procedures and GI recommendations/considerations before altering T2DM therapy  Chronic kidney disease stage 3a -most recent GFR 5817farxiga increased to 1022maily to provide additional glycemic control and protection -avoid NSAIDS -on ARB  Patient Goals/Self-Care Activities patient will:  - take medications as prescribed as evidenced by patient report and record review  check glucose fasting & 1-2 hours after meals daily, document, and provide at future appointments collaborate with provider on medication access solutions target a minimum of 150 minutes of moderate intensity exercise weekly engage in dietary modifications by FOLLOWING A HEART HEALTHY DIET/HEALTHY PLATE METHOD          Plan: Telephone follow up appointment with care management team member scheduled for:  1-2 months      Regina Eck, PharmD, BCPS Clinical Pharmacist, [place]  II Phone [phone]

## 2022-02-15 NOTE — Patient Instructions (Signed)
Visit Information  Following are the goals we discussed today:   Current Barriers:  Unable to independently afford treatment regimen Unable to achieve control of t2dm, ckd  Suboptimal therapeutic regimen for t2dm, ckd   Pharmacist Clinical Goal(s):  patient will verbalize ability to afford treatment regimen through collaboration with PharmD and provider.   Interventions: 1:1 collaboration with Dettinger, Fransisca Kaufmann, MD regarding development and update of comprehensive plan of care as evidenced by provider attestation and co-signature Inter-disciplinary care team collaboration (see longitudinal plan of care) Comprehensive medication review performed; medication list updated in electronic medical record  Diabetes: New goal. Uncontrolled; A1c 7.7%, GFR 58 Current treatment: Most recently on Ozempic + increased metformin dose (GI discontinued Ozempic)--> PCP started low dose Farxiga '5mg'$  daily in the AM GI issues with diarrhea/abdominal bloating--seeing GI for colonoscopy in 03/2022, pt self reports history of "tricky" bowel,  s/p cholecystectomy (remote HX) Notes mild improvement in GI symptomatology when Ozempic was stopped (documented intolerance in EMR) States GI requested to hold GLP1 due to pancreatic insufficiency --> pt now on Creon  back pain issues --> multiple rounds of steroids He has had several episodes of incontinence due to the inability to get to the bathroom in time.  No increased episodes with addition of Wilder Glade We are unable to use DPP4, GLP1/GIP therapy at this time (will re-address once cleared by GI).   Discussed with patient and wife the possibility we may have to use insulin to control blood sugar.  TZDs/sulfonylureas would not provide the glycemic control and outcomes we are striving for.  Discussed addition of insulin if we are unable to use any other categories of medications be beneficial  Current glucose readings: fasting glucose: 200s, post prandial glucose:  n/a Reports hyperglycemic symptoms Discussed meal planning options and Plate method for healthy eating Avoid sugary drinks and desserts Incorporate balanced protein, non starchy veggies, 1 serving of carbohydrate with each meal Increase water intake Increase physical activity as able Current exercise: encouraged --only as able Recommended increase in [place] '10mg'$  daily in the AM for now (samples left up front #1 month); patient to check FBG and post prandials; Reassess procedures and GI recommendations/considerations before altering T2DM therapy  Chronic kidney disease stage 3a - most recent GFR 36 -farxiga increased to '10mg'$  daily to provide additional glycemic control and protection -avoid NSAIDS   Patient Goals/Self-Care Activities patient will:  - take medications as prescribed as evidenced by patient report and record review check glucose fasting & 1-2 hours after meals daily, document, and provide at future appointments collaborate with provider on medication access solutions target a minimum of 150 minutes of moderate intensity exercise weekly engage in dietary modifications by FOLLOWING A HEART HEALTHY DIET/HEALTHY PLATE METHOD     Plan: Telephone follow up appointment with care management team member scheduled for:  1 month  Signature Regina Eck, PharmD, BCPS Clinical Pharmacist, [place]  II Phone [phone]   Please call the care guide team at [phone] if you need to cancel or reschedule your appointment.   Patient verbalizes understanding of instructions and care plan provided today and agrees to view in [place]. Active MyChart status and patient understanding of how to access instructions and care plan via MyChart confirmed with patient.

## 2022-02-27 ENCOUNTER — Telehealth: Payer: Self-pay

## 2022-02-27 NOTE — Progress Notes (Signed)
  Chronic Care Management   Note  02/27/2022 Name: Jeffrey Rubio MRN: [id number] DOB: Mar 21, 1946  Jeffrey Rubio is a 76 y.o. year old male who is a primary care patient of Dettinger, Fransisca Kaufmann, MD. I reached out to [name] by phone today in response to a referral sent by Jeffrey Rubio.  Jeffrey Rubio  agreedto scheduling an appointment with the CCM RN Case Manager   Follow up plan: Patient agreed to scheduled appointment with RN Case Manager on 03/14/2022(date/time).   Noreene Larsson, [place], [place] [address] Direct Dial: [phone] [email or web address]

## 2022-03-02 DIAGNOSIS — E785 Hyperlipidemia, unspecified: Secondary | ICD-10-CM | POA: Diagnosis not present

## 2022-03-02 DIAGNOSIS — I129 Hypertensive chronic kidney disease with stage 1 through stage 4 chronic kidney disease, or unspecified chronic kidney disease: Secondary | ICD-10-CM | POA: Diagnosis not present

## 2022-03-02 DIAGNOSIS — N1831 Chronic kidney disease, stage 3a: Secondary | ICD-10-CM | POA: Diagnosis not present

## 2022-03-02 DIAGNOSIS — E1122 Type 2 diabetes mellitus with diabetic chronic kidney disease: Secondary | ICD-10-CM | POA: Diagnosis not present

## 2022-03-13 NOTE — Telephone Encounter (Signed)
Pt is almost out of [place] and needs more samples.

## 2022-03-13 NOTE — Telephone Encounter (Signed)
Pt informed that we do not have samples and that we can call him when we do. Pt wants to know how the paperwork is "coming along" . Messaged forwarded to [place] for advice.

## 2022-03-14 ENCOUNTER — Ambulatory Visit (INDEPENDENT_AMBULATORY_CARE_PROVIDER_SITE_OTHER): Payer: Medicare Other | Admitting: *Deleted

## 2022-03-14 DIAGNOSIS — I152 Hypertension secondary to endocrine disorders: Secondary | ICD-10-CM

## 2022-03-14 DIAGNOSIS — E1169 Type 2 diabetes mellitus with other specified complication: Secondary | ICD-10-CM

## 2022-03-14 NOTE — Plan of Care (Signed)
Chronic Care Management Provider Comprehensive Care Plan    03/14/2022 Name: Jeffrey Rubio MRN: [id number] DOB: [birth year]  Referral to Chronic Care Management (CCM) services was placed by Provider:  Caryl Pina MD on Date: 02/14/22.  Chronic Condition 1: HYPERTENSION Provider Assessment and Plan  Hypertension associated with diabetes ([place])     Relevant Medications    dapagliflozin propanediol (FARXIGA) 5 MG TABS tablet     Expected Outcome/Goals Addressed This Visit (Provider CCM goals/Provider Assessment and plan  CCM (HYPERTENSION) EXPECTED OUTCOME: MONITOR, SELF-MANAGE AND REDUCE SYMPTOMS OF HYPERTENSION  Symptom Management Condition 1: Take medications as prescribed   Attend all scheduled provider appointments Call pharmacy for medication refills 3-7 days in advance of running out of medications Attend church or other social activities Perform all self care activities independently  Perform IADL's (shopping, preparing meals, housekeeping, managing finances) independently Call provider office for new concerns or questions  check blood pressure weekly choose a place to take my blood pressure (home, clinic or office, retail store) write blood pressure results in a log or diary learn about high blood pressure keep a blood pressure log take blood pressure log to all doctor appointments call doctor for signs and symptoms of high blood pressure keep all doctor appointments take medications for blood pressure exactly as prescribed eat more whole grains, fruits and vegetables, lean meats and healthy fats Look over education sent via my chart- low sodium diet  Chronic Condition 2: DIABETES Provider Assessment and Plan  DM (diabetes mellitus) ([place]) - Primary     Relevant Medications    dapagliflozin propanediol (FARXIGA) 5 MG TABS tablet    Other Relevant Orders    Bayer DCA Hb A1c Waived    A1c 7.7, just recently stopped the [place].  If still trending upward, will  start [place]  Blood pressure is borderline has been good in the past so we will monitor.   Expected Outcome/Goals Addressed This Visit (Provider CCM goals/Provider Assessment and plan  CCM (DIABETES) EXPECTED OUTCOME:  MONITOR, SELF-MANAGE AND REDUCE SYMPTOMS OF DIABETES  Symptom Management Condition 2: Take medications as prescribed   Attend all scheduled provider appointments Call pharmacy for medication refills 3-7 days in advance of running out of medications Attend church or other social activities Perform all self care activities independently  Perform IADL's (shopping, preparing meals, housekeeping, managing finances) independently Call provider office for new concerns or questions  check blood sugar at prescribed times: twice daily check feet daily for cuts, sores or redness enter blood sugar readings and medication or insulin into daily log take the blood sugar log to all doctor visits take the blood sugar meter to all doctor visits trim toenails straight across fill half of plate with vegetables limit fast food meals to no more than 1 per week manage portion size read food labels for fat, fiber, carbohydrates and portion size keep feet up while sitting wash and dry feet carefully every day Advanced directives packet mailed Look over education sent via my chart - hypoglycemia  Problem List Patient Active Problem List   Diagnosis Date Noted   S/P lumbar microdiscectomy 09/20/2021   Pain management contract signed 09/12/2018   Overweight (BMI 25.0-29.9) 12/07/2017   Abnormal cardiovascular stress test 01/11/2017   Urgency incontinence 12/28/2015   Restless leg syndrome 01/01/2015   Benign prostatic hyperplasia    Irritable bowel syndrome 04/15/2013   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   Hypertension associated with diabetes ([place]) 10/22/2012   Hyperlipidemia associated with type 2  diabetes mellitus ([place]) 10/22/2012   DM (diabetes mellitus) ([place]) 10/22/2012     Medication Management  Current Outpatient Medications:    amLODipine (NORVASC) 5 MG tablet, TAKE 1 TABLET DAILY, Disp: 90 tablet, Rfl: 0   aspirin 81 MG tablet, Take 81 mg by mouth daily., Disp: , Rfl:    b complex vitamins capsule, Take 1 capsule by mouth daily., Disp: , Rfl:    Blood Glucose Monitoring Suppl (ACCU-CHEK AVIVA PLUS) w/Device KIT, USE to check blood sugars daily, Disp: 1 kit, Rfl: 0   Cholecalciferol (VITAMIN D3) 2000 units TABS, Take 4,000 Units by mouth every morning., Disp: , Rfl:    dapagliflozin propanediol (FARXIGA) 5 MG TABS tablet, Take 1 tablet (5 mg total) by mouth daily before breakfast. (Patient taking differently: Take 10 mg by mouth daily before breakfast.), Disp: 30 tablet, Rfl: 5   famotidine (PEPCID) 20 MG tablet, TAKE 1 TABLET TWICE A DAY, Disp: 180 tablet, Rfl: 0   glucose blood (ACCU-CHEK AVIVA PLUS) test strip, TEST BLOOD SUGAR DAILY DX E11.69, Disp: 100 strip, Rfl: 9   irbesartan (AVAPRO) 300 MG tablet, TAKE 1 TABLET DAILY, Disp: 90 tablet, Rfl: 3   magnesium gluconate (MAGONATE) 500 MG tablet, Take 500 mg by mouth daily., Disp: , Rfl:    meclizine (ANTIVERT) 25 MG tablet, Take 1 tablet (25 mg total) by mouth 3 (three) times daily as needed for dizziness., Disp: 30 tablet, Rfl: 0   metFORMIN (GLUCOPHAGE) 500 MG tablet, TAKE 1 TABLET TWICE DAILY  WITH MEALS, Disp: 180 tablet, Rfl: 3   Omega-3 Fatty Acids (FISH OIL) 1000 MG CAPS, Take 1,000 mg by mouth in the morning and at bedtime., Disp: , Rfl:    pravastatin (PRAVACHOL) 20 MG tablet, TAKE 1 TABLET DAILY, Disp: 90 tablet, Rfl: 3   Probiotic Product (PROBIOTIC-10 PO), Take 1 capsule by mouth daily., Disp: , Rfl:    Psyllium (METAMUCIL) 28.3 % POWD, Take 3 Scoops by mouth daily., Disp: , Rfl:    tamsulosin (FLOMAX) 0.4 MG CAPS capsule, Take 1 capsule (0.4 mg total) by mouth daily., Disp: 90 capsule, Rfl: 3   TRUEplus Lancets 28G MISC, Use to check blood sugar once daily and PRN Dx. E11.9, Disp: 100 each,  Rfl: 3   ZINC CITRATE PO, Take 10 mg by mouth daily., Disp: , Rfl:    doxepin (SINEQUAN) 100 MG capsule, Take 1 capsule (100 mg total) by mouth at bedtime as needed., Disp: 90 capsule, Rfl: 1  Cognitive Assessment Identity Confirmed: : Name; DOB Cognitive Status: Normal   Functional Assessment Hearing Difficulty or Deaf: yes (pt states slight hearing loss and will be having hearing checked) Hearing Management: will be checking into hearing aides for slight hearing loss Wear Glasses or Blind: yes Vision Management: can see well w/ glasses Concentrating, Remembering or Making Decisions Difficulty (CP): no Difficulty Communicating: no Difficulty Eating/Swallowing: no Walking or Climbing Stairs Difficulty: no Dressing/Bathing Difficulty: no Doing Errands Independently Difficulty (such as shopping) (CP): no   Caregiver Assessment  Primary Source of Support/Comfort: spouse Name of Support/Comfort Primary Source: Jeffrey Rubio spouse People in Home: spouse Name(s) of People in Home: Jeffrey Rubio if Needed: none   Planned Interventions  Evaluation of current treatment plan related to hypertension self management and patient's adherence to plan as established by provider;   Reviewed prescribed diet low sodium Reviewed medications with patient and discussed importance of compliance;  Counseled on the importance of exercise goals with target of 150 minutes per  week Discussed plans with patient for ongoing care management follow up and provided patient with direct contact information for care management team; Advised patient, providing education and rationale, to monitor blood pressure daily and record, calling PCP for findings outside established parameters;  Provided education on prescribed diet low sodium;  Discussed complications of poorly controlled blood pressure such as heart disease, stroke, circulatory complications, vision complications, kidney impairment,  sexual dysfunction;  Screening for signs and symptoms of depression related to chronic disease state;  Assessed social determinant of health barriers;  Provided education to patient about basic DM disease process; Reviewed medications with patient and discussed importance of medication adherence;        Reviewed prescribed diet with patient carbohydrate modified; Counseled on importance of regular laboratory monitoring as prescribed;        Discussed plans with patient for ongoing care management follow up and provided patient with direct contact information for care management team;      Provided patient with written educational materials related to hypo and hyperglycemia and importance of correct treatment;       Reviewed scheduled/upcoming provider appointments including: pharmacist 03/23/22, primary care provider 04/21/22;         Advised patient, providing education and rationale, to check cbg twice daily and record        call provider for findings outside established parameters;       Screening for signs and symptoms of depression related to chronic disease state;        Assessed social determinant of health barriers;        Advanced directives packet mailed  Interaction and coordination with outside resources, practitioners, and providers See CCM Referral  Care Plan: Available in MyChart

## 2022-03-14 NOTE — Chronic Care Management (AMB) (Signed)
Chronic Care Management   CCM RN Visit Note  03/14/2022 Name: Jeffrey Rubio MRN: [id number] DOB: 04-20-1945  Subjective: Jeffrey Rubio is a 76 y.o. year old male who is a primary care patient of Dettinger, Fransisca Kaufmann, MD. The patient was referred to the Chronic Care Management team for assistance with care management needs subsequent to provider initiation of CCM services and plan of care.    Today's Visit:  Engaged with patient by telephone for initial visit.     SDOH Interventions Today    Flowsheet Row Most Recent Value  SDOH Interventions   Food Insecurity Interventions Intervention Not Indicated  Housing Interventions Intervention Not Indicated  Transportation Interventions Intervention Not Indicated  Utilities Interventions Intervention Not Indicated  Financial Strain Interventions Intervention Not Indicated  Physical Activity Interventions Intervention Not Indicated  Stress Interventions Intervention Not Indicated  Social Connections Interventions Intervention Not Indicated         Goals Addressed             This Visit's Progress    CCM (DIABETES) EXPECTED OUTCOME:  MONITOR, SELF-MANAGE AND REDUCE SYMPTOMS OF DIABETES       Current Barriers:  Knowledge Deficits related to Diabetes management Chronic Disease Management support and education needs related to Diabetes and diet No Advanced Directives in place- pt requests information be mailed Patient reports he checks CBG BID and since being on Farxiga blood sugars have improved with fasting ranges now 142-159, random ranges 130-140's, pt is working with [facility] pharmacist 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 Patient tries to follow carbohydrate modified diet, does not drink sugary drinks  Planned Interventions: Provided education to patient about basic DM disease process; Reviewed medications with patient and discussed importance of medication adherence;        Reviewed prescribed diet with  patient carbohydrate modified; Counseled on importance of regular laboratory monitoring as prescribed;        Discussed plans with patient for ongoing care management follow up and provided patient with direct contact information for care management team;      Provided patient with written educational materials related to hypo and hyperglycemia and importance of correct treatment;       Reviewed scheduled/upcoming provider appointments including: pharmacist 03/23/22, primary care provider 04/21/22;         Advised patient, providing education and rationale, to check cbg twice daily and record        call provider for findings outside established parameters;       Screening for signs and symptoms of depression related to chronic disease state;        Assessed social determinant of health barriers;        Advanced directives packet mailed  Symptom Management: Take medications as prescribed   Attend all scheduled provider appointments Call pharmacy for medication refills 3-7 days in advance of running out of medications Attend church or other social activities Perform all self care activities independently  Perform IADL's (shopping, preparing meals, housekeeping, managing finances) independently Call provider office for new concerns or questions  check blood sugar at prescribed times: twice daily check feet daily for cuts, sores or redness enter blood sugar readings and medication or insulin into daily log take the blood sugar log to all doctor visits take the blood sugar meter to all doctor visits trim toenails straight across fill half of plate with vegetables limit fast food meals to no more than 1 per week manage portion size read  food labels for fat, fiber, carbohydrates and portion size keep feet up while sitting wash and dry feet carefully every day Advanced directives packet mailed Look over education sent via my chart - hypoglycemia  Follow Up Plan: Telephone follow up  appointment with care management team member scheduled for:  05/17/21 at 9 am         CCM (HYPERTENSION) EXPECTED OUTCOME: MONITOR, SELF-MANAGE AND REDUCE SYMPTOMS OF HYPERTENSION       Current Barriers:  Knowledge Deficits related to Hypertension management Chronic Disease Management support and education needs related to Hypertension Patient reports he lives with spouse, is independent with all aspects of his care, is active on his farm.   Patient states he checks blood pressure on occasion and readings are within normal limits  Planned Interventions: Evaluation of current treatment plan related to hypertension self management and patient's adherence to plan as established by provider;   Reviewed prescribed diet low sodium Reviewed medications with patient and discussed importance of compliance;  Counseled on the importance of exercise goals with target of 150 minutes per week Discussed plans with patient for ongoing care management follow up and provided patient with direct contact information for care management team; Advised patient, providing education and rationale, to monitor blood pressure daily and record, calling PCP for findings outside established parameters;  Provided education on prescribed diet low sodium;  Discussed complications of poorly controlled blood pressure such as heart disease, stroke, circulatory complications, vision complications, kidney impairment, sexual dysfunction;  Screening for signs and symptoms of depression related to chronic disease state;  Assessed social determinant of health barriers;   Symptom Management: Take medications as prescribed   Attend all scheduled provider appointments Call pharmacy for medication refills 3-7 days in advance of running out of medications Attend church or other social activities Perform all self care activities independently  Perform IADL's (shopping, preparing meals, housekeeping, managing finances)  independently Call provider office for new concerns or questions  check blood pressure weekly choose a place to take my blood pressure (home, clinic or office, retail store) write blood pressure results in a log or diary learn about high blood pressure keep a blood pressure log take blood pressure log to all doctor appointments call doctor for signs and symptoms of high blood pressure keep all doctor appointments take medications for blood pressure exactly as prescribed eat more whole grains, fruits and vegetables, lean meats and healthy fats Look over education sent via my chart- low sodium diet  Follow Up Plan: Telephone follow up appointment with care management team member scheduled for: 05/17/22 at 9 am          Plan:Telephone follow up appointment with care management team member scheduled for:  05/17/22 at 9 am  Jacqlyn Larsen [facility], BSN RN Case Manager [place] [phone]

## 2022-03-14 NOTE — Patient Instructions (Signed)
Please call the care guide team at [phone] if you need to cancel or reschedule your appointment.   If you are experiencing a Mental Health or [place] or need someone to talk to, please call the Suicide and Crisis Lifeline: 988 call the [place] National Suicide Prevention Lifeline: (573) 033-5885 or TTY: 252-661-1736 TTY (806)730-6216) to talk to a trained counselor call 1-800-273-TALK (toll free, 24 hour hotline) go to [facility] [place] [phone]) call the [employer]: [phone] call 911   Following is a copy of your full provider care plan:   Goals Addressed             This Visit's Progress    CCM (DIABETES) EXPECTED OUTCOME:  MONITOR, SELF-MANAGE AND REDUCE SYMPTOMS OF DIABETES       Current Barriers:  Knowledge Deficits related to Diabetes management Chronic Disease Management support and education needs related to Diabetes and diet No Advanced Directives in place- pt requests information be mailed Patient reports he checks CBG BID and since being on Farxiga blood sugars have improved with fasting ranges now 142-159, random ranges 130-140's, pt is working with [facility] pharmacist 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 Patient tries to follow carbohydrate modified diet, does not drink sugary drinks  Planned Interventions: Provided education to patient about basic DM disease process; Reviewed medications with patient and discussed importance of medication adherence;        Reviewed prescribed diet with patient carbohydrate modified; Counseled on importance of regular laboratory monitoring as prescribed;        Discussed plans with patient for ongoing care management follow up and provided patient with direct contact information for care management team;      Provided patient with written educational materials related to hypo and hyperglycemia and importance of correct  treatment;       Reviewed scheduled/upcoming provider appointments including: pharmacist 03/23/22, primary care provider 04/21/22;         Advised patient, providing education and rationale, to check cbg twice daily and record        call provider for findings outside established parameters;       Screening for signs and symptoms of depression related to chronic disease state;        Assessed social determinant of health barriers;        Advanced directives packet mailed  Symptom Management: Take medications as prescribed   Attend all scheduled provider appointments Call pharmacy for medication refills 3-7 days in advance of running out of medications Attend church or other social activities Perform all self care activities independently  Perform IADL's (shopping, preparing meals, housekeeping, managing finances) independently Call provider office for new concerns or questions  check blood sugar at prescribed times: twice daily check feet daily for cuts, sores or redness enter blood sugar readings and medication or insulin into daily log take the blood sugar log to all doctor visits take the blood sugar meter to all doctor visits trim toenails straight across fill half of plate with vegetables limit fast food meals to no more than 1 per week manage portion size read food labels for fat, fiber, carbohydrates and portion size keep feet up while sitting wash and dry feet carefully every day Advanced directives packet mailed Look over education sent via my chart - hypoglycemia  Follow Up Plan: Telephone follow up appointment with care management team member scheduled for:  05/17/21 at 9 am  CCM (HYPERTENSION) EXPECTED OUTCOME: MONITOR, SELF-MANAGE AND REDUCE SYMPTOMS OF HYPERTENSION       Current Barriers:  Knowledge Deficits related to Hypertension management Chronic Disease Management support and education needs related to Hypertension Patient reports he lives with spouse,  is independent with all aspects of his care, is active on his farm.   Patient states he checks blood pressure on occasion and readings are within normal limits  Planned Interventions: Evaluation of current treatment plan related to hypertension self management and patient's adherence to plan as established by provider;   Reviewed prescribed diet low sodium Reviewed medications with patient and discussed importance of compliance;  Counseled on the importance of exercise goals with target of 150 minutes per week Discussed plans with patient for ongoing care management follow up and provided patient with direct contact information for care management team; Advised patient, providing education and rationale, to monitor blood pressure daily and record, calling PCP for findings outside established parameters;  Provided education on prescribed diet low sodium;  Discussed complications of poorly controlled blood pressure such as heart disease, stroke, circulatory complications, vision complications, kidney impairment, sexual dysfunction;  Screening for signs and symptoms of depression related to chronic disease state;  Assessed social determinant of health barriers;   Symptom Management: Take medications as prescribed   Attend all scheduled provider appointments Call pharmacy for medication refills 3-7 days in advance of running out of medications Attend church or other social activities Perform all self care activities independently  Perform IADL's (shopping, preparing meals, housekeeping, managing finances) independently Call provider office for new concerns or questions  check blood pressure weekly choose a place to take my blood pressure (home, clinic or office, retail store) write blood pressure results in a log or diary learn about high blood pressure keep a blood pressure log take blood pressure log to all doctor appointments call doctor for signs and symptoms of high blood pressure keep  all doctor appointments take medications for blood pressure exactly as prescribed eat more whole grains, fruits and vegetables, lean meats and healthy fats Look over education sent via my chart- low sodium diet  Follow Up Plan: Telephone follow up appointment with care management team member scheduled for: 05/17/22 at 9 am          Patient verbalizes understanding of instructions and care plan provided today and agrees to view in [place]. Active MyChart status and patient understanding of how to access instructions and care plan via MyChart confirmed with patient.     Telephone follow up appointment with care management team member scheduled for:  05/17/22 at 9 am  Hypertension, Adult High blood pressure (hypertension) is when the force of blood pumping through the arteries is too strong. The arteries are the blood vessels that carry blood from the heart throughout the body. Hypertension forces the heart to work harder to pump blood and may cause arteries to become narrow or stiff. Untreated or uncontrolled hypertension can lead to a heart attack, heart failure, a stroke, kidney disease, and other problems. A blood pressure reading consists of a higher number over a lower number. Ideally, your blood pressure should be below 120/80. The first ("top") number is called the systolic pressure. It is a measure of the pressure in your arteries as your heart beats. The second ("bottom") number is called the diastolic pressure. It is a measure of the pressure in your arteries as the heart relaxes. What are the causes? The exact cause of this condition is not  known. There are some conditions that result in high blood pressure. What increases the risk? Certain factors may make you more likely to develop high blood pressure. Some of these risk factors are under your control, including: Smoking. Not getting enough exercise or physical activity. Being overweight. Having too much fat, sugar, calories, or  salt (sodium) in your diet. Drinking too much alcohol. Other risk factors include: Having a personal history of heart disease, diabetes, high cholesterol, or kidney disease. Stress. Having a family history of high blood pressure and high cholesterol. Having obstructive sleep apnea. Age. The risk increases with age. What are the signs or symptoms? High blood pressure may not cause symptoms. Very high blood pressure (hypertensive crisis) may cause: Headache. Fast or irregular heartbeats (palpitations). Shortness of breath. Nosebleed. Nausea and vomiting. Vision changes. Severe chest pain, dizziness, and seizures. How is this diagnosed? This condition is diagnosed by measuring your blood pressure while you are seated, with your arm resting on a flat surface, your legs uncrossed, and your feet flat on the floor. The cuff of the blood pressure monitor will be placed directly against the skin of your upper arm at the level of your heart. Blood pressure should be measured at least twice using the same arm. Certain conditions can cause a difference in blood pressure between your right and left arms. If you have a high blood pressure reading during one visit or you have normal blood pressure with other risk factors, you may be asked to: Return on a different day to have your blood pressure checked again. Monitor your blood pressure at home for 1 week or longer. If you are diagnosed with hypertension, you may have other blood or imaging tests to help your health care provider understand your overall risk for other conditions. How is this treated? This condition is treated by making healthy lifestyle changes, such as eating healthy foods, exercising more, and reducing your alcohol intake. You may be referred for counseling on a healthy diet and physical activity. Your health care provider may prescribe medicine if lifestyle changes are not enough to get your blood pressure under control and if: Your  systolic blood pressure is above 130. Your diastolic blood pressure is above 80. Your personal target blood pressure may vary depending on your medical conditions, your age, and other factors. Follow these instructions at home: Eating and drinking  Eat a diet that is high in fiber and potassium, and low in sodium, added sugar, and fat. An example of this eating plan is called the DASH diet. DASH stands for Dietary Approaches to Stop Hypertension. To eat this way: Eat plenty of fresh fruits and vegetables. Try to fill one half of your plate at each meal with fruits and vegetables. Eat whole grains, such as whole-wheat pasta, brown rice, or whole-grain bread. Fill about one fourth of your plate with whole grains. Eat or drink low-fat dairy products, such as skim milk or low-fat yogurt. Avoid fatty cuts of meat, processed or cured meats, and poultry with skin. Fill about one fourth of your plate with lean proteins, such as fish, chicken without skin, beans, eggs, or tofu. Avoid pre-made and processed foods. These tend to be higher in sodium, added sugar, and fat. Reduce your daily sodium intake. Many people with hypertension should eat less than 1,500 mg of sodium a day. Do not drink alcohol if: Your health care provider tells you not to drink. You are pregnant, may be pregnant, or are planning to become pregnant.  If you drink alcohol: Limit how much you have to: 0-1 drink a day for women. 0-2 drinks a day for men. Know how much alcohol is in your drink. In the [place] one drink equals one 12 oz bottle of beer (355 mL), one 5 oz glass of wine (148 mL), or one 1 oz glass of hard liquor (44 mL). Lifestyle  Work with your health care provider to maintain a healthy body weight or to lose weight. Ask what an ideal weight is for you. Get at least 30 minutes of exercise that causes your heart to beat faster (aerobic exercise) most days of the week. Activities may include walking, swimming, or  biking. Include exercise to strengthen your muscles (resistance exercise), such as Pilates or lifting weights, as part of your weekly exercise routine. Try to do these types of exercises for 30 minutes at least 3 days a week. Do not use any products that contain nicotine or tobacco. These products include cigarettes, chewing tobacco, and vaping devices, such as e-cigarettes. If you need help quitting, ask your health care provider. Monitor your blood pressure at home as told by your health care provider. Keep all follow-up visits. This is important. Medicines Take over-the-counter and prescription medicines only as told by your health care provider. Follow directions carefully. Blood pressure medicines must be taken as prescribed. Do not skip doses of blood pressure medicine. Doing this puts you at risk for problems and can make the medicine less effective. Ask your health care provider about side effects or reactions to medicines that you should watch for. Contact a health care provider if you: Think you are having a reaction to a medicine you are taking. Have headaches that keep coming back (recurring). Feel dizzy. Have swelling in your ankles. Have trouble with your vision. Get help right away if you: Develop a severe headache or confusion. Have unusual weakness or numbness. Feel faint. Have severe pain in your chest or abdomen. Vomit repeatedly. Have trouble breathing. These symptoms may be an emergency. Get help right away. Call 911. Do not wait to see if the symptoms will go away. Do not drive yourself to the hospital. Summary Hypertension is when the force of blood pumping through your arteries is too strong. If this condition is not controlled, it may put you at risk for serious complications. Your personal target blood pressure may vary depending on your medical conditions, your age, and other factors. For most people, a normal blood pressure is less than 120/80. Hypertension is  treated with lifestyle changes, medicines, or a combination of both. Lifestyle changes include losing weight, eating a healthy, low-sodium diet, exercising more, and limiting alcohol. This information is not intended to replace advice given to you by your health care provider. Make sure you discuss any questions you have with your health care provider. Document Revised: 01/25/2021 Document Reviewed: 01/25/2021 Elsevier Patient Education  [place]. Hypoglycemia Hypoglycemia occurs when the level of sugar (glucose) in the blood is too low. Hypoglycemia can happen in people who have or do not have diabetes. It can develop quickly, and it can be a medical emergency. For most people, a blood glucose level below 70 mg/dL (3.9 mmol/L) is considered hypoglycemia. Glucose is a type of sugar that provides the body's main source of energy. Certain hormones (insulin and glucagon) control the level of glucose in the blood. Insulin lowers blood glucose, and glucagon raises blood glucose. Hypoglycemia can result from having too much insulin in the bloodstream, or  from not eating enough food that contains glucose. You may also have reactive hypoglycemia, which happens within 4 hours after eating a meal. What are the causes? Hypoglycemia occurs most often in people who have diabetes and may be caused by: Diabetes medicine. Not eating enough, or not eating often enough. Increased physical activity. Drinking alcohol on an empty stomach. If you do not have diabetes, hypoglycemia may be caused by: A tumor in the pancreas. Not eating enough, or not eating for long periods at a time (fasting). A severe infection or illness. Problems after having bariatric surgery. Organ failure, such as kidney or liver failure. Certain medicines. What increases the risk? Hypoglycemia is more likely to develop in people who: Have diabetes and take medicines to lower blood glucose. Abuse alcohol. Have a severe  illness. What are the signs or symptoms? Symptoms vary depending on whether the condition is mild, moderate, or severe. Mild hypoglycemia Hunger. Sweating and feeling clammy. Dizziness or feeling light-headed. Sleepiness or restless sleep. Nausea. Increased heart rate. Headache. Blurry vision. Mood changes, such as irritability or anxiety. Tingling or numbness around the mouth, lips, or tongue. Moderate hypoglycemia Confusion and poor judgment. Behavior changes. Weakness. Irregular heartbeat. A change in coordination. Severe hypoglycemia Severe hypoglycemia is a medical emergency. It can cause: Fainting. Seizures. Loss of consciousness (coma). Death. How is this diagnosed? Hypoglycemia is diagnosed with a blood test to measure your blood glucose level. This blood test is done while you are having symptoms. Your health care provider may also do a physical exam and review your medical history. How is this treated? This condition can be treated by immediately eating or drinking something that contains sugar with 15 grams of fast-acting carbohydrate, such as: 4 oz (120 mL) of fruit juice. 4 oz (120 mL) of regular soda (not diet soda). Several pieces of hard candy. Check food labels to find out how many pieces to eat for 15 grams. 1 Tbsp (15 mL) of sugar or honey. 4 glucose tablets. 1 tube of glucose gel. Treating hypoglycemia if you have diabetes If you are alert and able to swallow safely, follow the 15:15 rule: Take 15 grams of a fast-acting carbohydrate. Talk with your health care provider about how much you should take. Options for getting 15 grams of fast-acting carbohydrate include: Glucose tablets (take 4 tablets). Several pieces of hard candy. Check food labels to find out how many pieces to eat for 15 grams. 4 oz (120 mL) of fruit juice. 4 oz (120 mL) of regular soda (not diet soda). 1 Tbsp (15 mL) of sugar or honey. 1 tube of glucose gel. Check your blood glucose 15  minutes after you take the carbohydrate. If the repeat blood glucose level is still at or below 70 mg/dL (3.9 mmol/L), take 15 grams of a carbohydrate again. If your blood glucose level does not increase above 70 mg/dL (3.9 mmol/L) after 3 tries, seek emergency medical care. After your blood glucose level returns to normal, eat a meal or a snack within 1 hour.  Treating severe hypoglycemia Severe hypoglycemia is when your blood glucose level is below 54 mg/dL (3 mmol/L). Severe hypoglycemia is a medical emergency. Get medical help right away. If you have severe hypoglycemia and you cannot eat or drink, you will need to be given glucagon. A family member or close friend should learn how to check your blood glucose and how to give you glucagon. Ask your health care provider if you need to have an emergency glucagon kit  available. Severe hypoglycemia may need to be treated in a hospital. The treatment may include getting glucose through an IV. You may also need treatment for the cause of your hypoglycemia. Follow these instructions at home:  General instructions Take over-the-counter and prescription medicines only as told by your health care provider. Monitor your blood glucose as told by your health care provider. If you drink alcohol: Limit how much you have to: 0-1 drink a day for women who are not pregnant. 0-2 drinks a day for men. Know how much alcohol is in your drink. In the [place] one drink equals one 12 oz bottle of beer (355 mL), one 5 oz glass of wine (148 mL), or one 1 oz glass of hard liquor (44 mL). Be sure to eat food along with drinking alcohol. Be aware that alcohol is absorbed quickly and may have lingering effects that may result in hypoglycemia later. Be sure to do ongoing glucose monitoring. Keep all follow-up visits. This is important. If you have diabetes: Always have a fast-acting carbohydrate (15 grams) option with you to treat low blood glucose. Follow your diabetes  management plan as directed by your health care provider. Make sure you: Know the symptoms of hypoglycemia. It is important to treat it right away to prevent it from becoming severe. Check your blood glucose as often as told. Always check before and after exercise. Always check your blood glucose before you drive a motorized vehicle. Take your medicines as told. Follow your meal plan. Eat on time, and do not skip meals. Share your diabetes management plan with people in your workplace, school, and household. Carry a medical alert card or wear medical alert jewelry. Where to find more information American Diabetes Association: www.diabetes.org Contact a health care provider if: You have problems keeping your blood glucose in your target range. You have frequent episodes of hypoglycemia. Get help right away if: You continue to have hypoglycemia symptoms after eating or drinking something that contains 15 grams of fast-acting carbohydrate, and you cannot get your blood glucose above 70 mg/dL (3.9 mmol/L) while following the 15:15 rule. Your blood glucose is below 54 mg/dL (3 mmol/L). You have a seizure. You faint. These symptoms may represent a serious problem that is an emergency. Do not wait to see if the symptoms will go away. Get medical help right away. Call your local emergency services (911 in the [place] Do not drive yourself to the hospital. Summary Hypoglycemia occurs when the level of sugar (glucose) in the blood is too low. Hypoglycemia can happen in people who have or do not have diabetes. It can develop quickly, and it can be a medical emergency. Make sure you know the symptoms of hypoglycemia and how to treat it. Always have a fast-acting carbohydrate option with you to treat low blood sugar. This information is not intended to replace advice given to you by your health care provider. Make sure you discuss any questions you have with your health care provider. Document Revised:  02/19/2020 Document Reviewed: 02/19/2020 Elsevier Patient Education  [place].

## 2022-03-16 ENCOUNTER — Other Ambulatory Visit (HOSPITAL_COMMUNITY): Payer: Self-pay

## 2022-03-16 ENCOUNTER — Telehealth: Payer: Self-pay | Admitting: Family Medicine

## 2022-03-16 ENCOUNTER — Other Ambulatory Visit: Payer: Self-pay | Admitting: Family Medicine

## 2022-03-16 ENCOUNTER — Telehealth: Payer: Self-pay

## 2022-03-16 DIAGNOSIS — E1169 Type 2 diabetes mellitus with other specified complication: Secondary | ICD-10-CM

## 2022-03-16 MED ORDER — DAPAGLIFLOZIN PROPANEDIOL 5 MG PO TABS: 10.0000 mg | ORAL_TABLET | Freq: Every day | ORAL | 5 refills | Status: DC

## 2022-03-16 MED ORDER — DAPAGLIFLOZIN PROPANEDIOL 10 MG PO TABS: 10.0000 mg | ORAL_TABLET | Freq: Every day | ORAL | 3 refills | Status: DC

## 2022-03-16 NOTE — Telephone Encounter (Signed)
Reached out to pt regarding Farxiga medication assistance.   Informed patient that his farxiga '10mg'$  has a copay of $11. Pt is ok with picking this up at his pharmacy. He is currently out of medication.  Could RX (farxiga '10mg'$  1 daily) be called in to [facility] in [place]?

## 2022-03-16 NOTE — Telephone Encounter (Signed)
Patient's wife calling back because the pharmacy said the medicine is going to be over $100

## 2022-03-16 NOTE — Telephone Encounter (Signed)
Sent prescription for Farxiga updated to 10 mg tablets.

## 2022-03-16 NOTE — Telephone Encounter (Signed)
RX for farxiga called in to [facility] [place] Thank you!!

## 2022-03-16 NOTE — Telephone Encounter (Signed)
Wife aware

## 2022-03-16 NOTE — Addendum Note (Signed)
Addended by: Lottie Dawson D on: 03/16/2022 10:24 AM   Modules accepted: Orders

## 2022-03-20 NOTE — Telephone Encounter (Signed)
Reached out to pharmacy to request medication be filled for a month supply. Copay is now $11.   Returned call to pt's wife to let her know the price change. Also encouraged her to reach out to me again in January if his copay in unaffordable and we can move forward with assistance.

## 2022-03-23 ENCOUNTER — Ambulatory Visit (INDEPENDENT_AMBULATORY_CARE_PROVIDER_SITE_OTHER): Payer: Medicare Other | Admitting: Pharmacist

## 2022-03-23 DIAGNOSIS — K6389 Other specified diseases of intestine: Secondary | ICD-10-CM | POA: Diagnosis not present

## 2022-03-23 DIAGNOSIS — R197 Diarrhea, unspecified: Secondary | ICD-10-CM | POA: Diagnosis not present

## 2022-03-23 DIAGNOSIS — E7800 Pure hypercholesterolemia, unspecified: Secondary | ICD-10-CM | POA: Diagnosis not present

## 2022-03-23 DIAGNOSIS — E1169 Type 2 diabetes mellitus with other specified complication: Secondary | ICD-10-CM | POA: Diagnosis not present

## 2022-03-23 DIAGNOSIS — E119 Type 2 diabetes mellitus without complications: Secondary | ICD-10-CM | POA: Diagnosis not present

## 2022-03-23 MED ORDER — ACCU-CHEK AVIVA PLUS VI STRP: ORAL_STRIP | 9 refills | Status: DC

## 2022-03-23 NOTE — Progress Notes (Signed)
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  No increased episodes with addition of Wilder Glade  We are unable to use DPP4, GLP1/GIP therapy at this time (will re-address once cleared by GI).   Discussed with patient and wife the possibility of using insulin to control blood sugar.  TZDs/sulfonylureas would not provide the glycemic control and outcomes we are striving for.  Discussed addition of insulin if we are unable to use any other categories of medications be beneficial  Current glucose readings: fasting glucose: wife reports within normal limits, post prandial glucose: n/a Reports hyperglycemic symptoms Discussed meal planning options and Plate method for healthy eating Avoid sugary drinks and desserts Incorporate balanced protein, non starchy veggies, 1 serving of carbohydrate with each meal Increase water intake Increase physical activity as able Current exercise: encouraged  --only as able Recommended increase in [place] '10mg'$  daily in the AM for now (samples left up front #1 month); patient to check FBG and post prandials; Reassess procedures and GI recommendations/considerations before altering T2DM therapy   Hypertension & Hyperlipidemia -both are currently controlled -Assessed and encouraged continued compliance with medications   Chronic kidney disease stage 3a -most recent GFR 58 -farxiga increased to '10mg'$  daily to provide additional glycemic control and protection -avoid NSAIDS -on ARB   Patient Goals/Self-Care Activities patient will:  - take medications as prescribed as evidenced by patient report and record review check glucose fasting & 1-2 hours after meals daily, document, and provide at future appointments collaborate with provider on medication access solutions target a minimum of 150 minutes of moderate intensity exercise weekly engage in dietary modifications by FOLLOWING A HEART HEALTHY DIET/HEALTHY PLATE METHOD     Patient denies hypoglycemic events.   O:  Lab Results  Component Value Date   HGBA1C 7.7 (H) 01/16/2022    Lipid Panel     Component Value Date/Time   CHOL 148 10/13/2021 1020   CHOL 161 10/22/2012 1032   TRIG 242 (H) 10/13/2021 1020   TRIG 211 (H) 06/03/2013 0925   TRIG 175 (H) 10/22/2012 1032   HDL 47 10/13/2021 1020   HDL 45 06/03/2013 0925   HDL 46 10/22/2012 1032   CHOLHDL 3.1 10/13/2021 1020   LDLCALC 62 10/13/2021 1020   LDLCALC 63 06/03/2013 0925   LDLCALC 80 10/22/2012 1032     Home fasting blood sugars: <130  2 hour post-meal/random blood sugars: n/a.    Clinical Atherosclerotic Cardiovascular Disease (ASCVD): No   The 10-year ASCVD risk score (Arnett DK, et al., 2019) is: 59.5%  Values used to calculate the score:     Age: 76 years     Sex: Male     Is Non-Hispanic African American: No     Diabetic: Yes     Tobacco smoker: No     Systolic Blood Pressure: 423 mmHg     Is BP treated:  Yes     HDL Cholesterol: 47 mg/dL     Total Cholesterol: 148 mg/dL    Written patient instructions provided.  Total time in counseling 20 minutes.   Follow up Pharmacist next week about glycemic control   Regina Eck, PharmD, BCPS, BCACP Clinical Pharmacist, [place]  II  T [phone]

## 2022-04-02 DIAGNOSIS — E1159 Type 2 diabetes mellitus with other circulatory complications: Secondary | ICD-10-CM | POA: Diagnosis not present

## 2022-04-02 DIAGNOSIS — I10 Essential (primary) hypertension: Secondary | ICD-10-CM | POA: Diagnosis not present

## 2022-04-08 ENCOUNTER — Other Ambulatory Visit: Payer: Self-pay | Admitting: Family Medicine

## 2022-04-08 DIAGNOSIS — K219 Gastro-esophageal reflux disease without esophagitis: Secondary | ICD-10-CM

## 2022-04-08 DIAGNOSIS — I152 Hypertension secondary to endocrine disorders: Secondary | ICD-10-CM

## 2022-04-21 ENCOUNTER — Encounter: Payer: Self-pay | Admitting: Family Medicine

## 2022-04-21 ENCOUNTER — Ambulatory Visit (INDEPENDENT_AMBULATORY_CARE_PROVIDER_SITE_OTHER): Payer: Medicare Other | Admitting: Family Medicine

## 2022-04-21 VITALS — BP 153/72 | HR 78 | Temp 98.2°F | Ht 67.0 in

## 2022-04-21 DIAGNOSIS — I152 Hypertension secondary to endocrine disorders: Secondary | ICD-10-CM | POA: Diagnosis not present

## 2022-04-21 DIAGNOSIS — E1159 Type 2 diabetes mellitus with other circulatory complications: Secondary | ICD-10-CM | POA: Diagnosis not present

## 2022-04-21 DIAGNOSIS — K219 Gastro-esophageal reflux disease without esophagitis: Secondary | ICD-10-CM | POA: Diagnosis not present

## 2022-04-21 DIAGNOSIS — E1169 Type 2 diabetes mellitus with other specified complication: Secondary | ICD-10-CM | POA: Diagnosis not present

## 2022-04-21 DIAGNOSIS — E785 Hyperlipidemia, unspecified: Secondary | ICD-10-CM

## 2022-04-21 LAB — BAYER DCA HB A1C WAIVED: HB A1C (BAYER DCA - WAIVED): 8.7 % — ABNORMAL HIGH (ref 4.8–5.6)

## 2022-04-21 MED ORDER — AMLODIPINE BESYLATE 5 MG PO TABS: 5.0000 mg | ORAL_TABLET | Freq: Every day | ORAL | 3 refills | Status: DC

## 2022-04-21 MED ORDER — TRESIBA FLEXTOUCH 100 UNIT/ML ~~LOC~~ SOPN: 10.0000 [IU] | PEN_INJECTOR | Freq: Every day | SUBCUTANEOUS | 11 refills | Status: DC

## 2022-04-21 MED ORDER — FAMOTIDINE 20 MG PO TABS: 20.0000 mg | ORAL_TABLET | Freq: Two times a day (BID) | ORAL | 3 refills | Status: DC

## 2022-04-21 MED ORDER — ACCU-CHEK AVIVA PLUS VI STRP: ORAL_STRIP | 9 refills | Status: DC

## 2022-04-21 NOTE — Progress Notes (Signed)
BP (!) 153/72   Pulse 78   Temp 98.2 F (36.8 C)   Ht '5\' 7"'$  (1.702 m)   SpO2 98%   BMI 28.04 kg/m    Subjective:   Patient ID: Jeffrey Rubio, male    DOB: 1945/07/31, 77 y.o.   MRN: [id number]  HPI: Jeffrey Rubio is a 77 y.o. male presenting on 04/21/2022 for Medical Management of Chronic Issues   HPI Type 2 diabetes mellitus Patient comes in today for recheck of his diabetes. Patient has been currently taking [place] and metformin. Patient is currently on an ACE inhibitor/ARB. Patient has not seen an ophthalmologist this year. Patient denies any issues with their feet. The symptom started onset as an adult hypertension and hyperlipidemia ARE RELATED TO DM   Hypertension Patient is currently on amlodipine and losartan, and their blood pressure today is 153/73. Patient denies any lightheadedness or dizziness. Patient denies headaches, blurred vision, chest pains, shortness of breath, or weakness. Denies any side effects from medication and is content with current medication.   Hyperlipidemia Patient is coming in for recheck of his hyperlipidemia. The patient is currently taking pravastatin and fish oils. They deny any issues with myalgias or history of liver damage from it. They deny any focal numbness or weakness or chest pain.   GERD Patient is currently on famotidine.  She denies any major symptoms or abdominal pain or belching or burping. She denies any blood in her stool or lightheadedness or dizziness.   Relevant past medical, surgical, family and social history reviewed and updated as indicated. Interim medical history since our last visit reviewed. Allergies and medications reviewed and updated.  Review of Systems  Constitutional:  Negative for chills and fever.  Respiratory:  Negative for shortness of breath and wheezing.   Cardiovascular:  Negative for chest pain and leg swelling.  Skin:  Negative for rash.  Neurological:  Negative for dizziness and  light-headedness.  All other systems reviewed and are negative.   Per HPI unless specifically indicated above   Allergies as of 04/21/2022       Reactions   Hydrocodone-acetaminophen    Causes Anxiety    Ozempic (0.25 Or 0.5 Mg-dose) [semaglutide(0.25 Or 0.'5mg'$ -dos)] Other (See Comments)   Pancreatic insufficiency, GI--diarrhea        Medication List        Accurate as of April 21, 2022 10:53 AM. If you have any questions, ask your nurse or doctor.          STOP taking these medications    magnesium gluconate 500 MG tablet Commonly known as: MAGONATE Stopped by: Fransisca Kaufmann Oralee Rapaport, MD   metFORMIN 500 MG tablet Commonly known as: GLUCOPHAGE Stopped by: Worthy Rancher, MD   ZINC CITRATE PO Stopped by: Fransisca Kaufmann Luisfernando Brightwell, MD       TAKE these medications    Accu-Chek Aviva Plus test strip Generic drug: glucose blood TEST BLOOD SUGAR 3 times DAILY DX E11.69   Accu-Chek Aviva Plus w/Device Kit USE to check blood sugars daily   amLODipine 5 MG tablet Commonly known as: NORVASC Take 1 tablet (5 mg total) by mouth daily.   aspirin 81 MG tablet Take 81 mg by mouth daily.   b complex vitamins capsule Take 1 capsule by mouth daily.   dapagliflozin propanediol 10 MG Tabs tablet Commonly known as: Farxiga Take 1 tablet (10 mg total) by mouth daily before breakfast.   famotidine 20 MG tablet Commonly known as: PEPCID Take  1 tablet (20 mg total) by mouth 2 (two) times daily.   Fish Oil 1000 MG Caps Take 1,000 mg by mouth in the morning and at bedtime.   irbesartan 300 MG tablet Commonly known as: AVAPRO TAKE 1 TABLET DAILY   meclizine 25 MG tablet Commonly known as: ANTIVERT Take 1 tablet (25 mg total) by mouth 3 (three) times daily as needed for dizziness.   Metamucil 28.3 % Powd Generic drug: Psyllium Take 3 Scoops by mouth daily.   pravastatin 20 MG tablet Commonly known as: PRAVACHOL TAKE 1 TABLET DAILY   PROBIOTIC-10 PO Take 1  capsule by mouth daily.   tamsulosin 0.4 MG Caps capsule Commonly known as: FLOMAX Take 1 capsule (0.4 mg total) by mouth daily.   Tyler Aas FlexTouch 100 UNIT/ML FlexTouch Pen Generic drug: insulin degludec Inject 10-20 Units into the skin at bedtime. Started by: Worthy Rancher, MD   TRUEplus Lancets 28G Misc Use to check blood sugar once daily and PRN Dx. E11.9   Vitamin D3 50 MCG (2000 UT) Tabs Take 4,000 Units by mouth every morning.         Objective:   BP (!) 153/72   Pulse 78   Temp 98.2 F (36.8 C)   Ht '5\' 7"'$  (1.702 m)   SpO2 98%   BMI 28.04 kg/m   Wt Readings from Last 3 Encounters:  01/16/22 179 lb (81.2 kg)  10/13/21 171 lb (77.6 kg)  09/20/21 172 lb (78 kg)    Physical Exam Vitals and nursing note reviewed.  Constitutional:      General: He is not in acute distress.    Appearance: He is well-developed. He is not diaphoretic.  Eyes:     General: No scleral icterus.    Conjunctiva/sclera: Conjunctivae normal.  Neck:     Thyroid: No thyromegaly.  Cardiovascular:     Rate and Rhythm: Normal rate and regular rhythm.     Heart sounds: Normal heart sounds. No murmur heard. Pulmonary:     Effort: Pulmonary effort is normal. No respiratory distress.     Breath sounds: Normal breath sounds. No wheezing.  Musculoskeletal:        General: No swelling. Normal range of motion.     Cervical back: Neck supple.  Lymphadenopathy:     Cervical: No cervical adenopathy.  Skin:    General: Skin is warm and dry.     Findings: No rash.  Neurological:     Mental Status: He is alert and oriented to person, place, and time.     Coordination: Coordination normal.  Psychiatric:        Behavior: Behavior normal.       Assessment & Plan:   Problem List Items Addressed This Visit       Cardiovascular and Mediastinum   Hypertension associated with diabetes ([place])   Relevant Medications   amLODipine (NORVASC) 5 MG tablet   insulin degludec (TRESIBA  FLEXTOUCH) 100 UNIT/ML FlexTouch Pen   Other Relevant Orders   CBC with Differential/Platelet   CMP14+EGFR   Lipid panel   Bayer DCA Hb A1c Waived     Endocrine   Hyperlipidemia associated with type 2 diabetes mellitus (HCC)   Relevant Medications   amLODipine (NORVASC) 5 MG tablet   insulin degludec (TRESIBA FLEXTOUCH) 100 UNIT/ML FlexTouch Pen   Other Relevant Orders   CBC with Differential/Platelet   CMP14+EGFR   Lipid panel   Bayer DCA Hb A1c Waived   DM (diabetes mellitus) (HCC) -  Primary   Relevant Medications   glucose blood (ACCU-CHEK AVIVA PLUS) test strip   insulin degludec (TRESIBA FLEXTOUCH) 100 UNIT/ML FlexTouch Pen   Other Relevant Orders   CBC with Differential/Platelet   CMP14+EGFR   Lipid panel   Bayer DCA Hb A1c Waived   Microalbumin / creatinine urine ratio   Other Visit Diagnoses     Gastroesophageal reflux disease without esophagitis       Relevant Medications   famotidine (PEPCID) 20 MG tablet       A1c is up at 8.7, will start Tresiba at 10 units nightly and he will increase by 2 units every 5 days until his blood sugars are consistently below 150 in the morning.  Watch for hypoglycemic episodes less than 75. Patient was unable to tolerate metformin or GLP-1 because of GI side effects.  BP slightly elevated as well monitor closely blood pressures as well.  Will need prescription assistance for this and will contact clinical pharmacy. Follow up plan: Return in about 3 months (around 07/21/2022), or if symptoms worsen or fail to improve, for Diabetes hypertension and cholesterol.  Counseling provided for all of the vaccine components Orders Placed This Encounter  Procedures   CBC with Differential/Platelet   CMP14+EGFR   Lipid panel   Bayer DCA Hb A1c Waived   Microalbumin / creatinine urine ratio    Caryl Pina, MD [facility] 04/21/2022, 10:53 AM

## 2022-04-21 NOTE — Patient Instructions (Signed)
A1c is up at 8.7, will start Tresiba at 10 units nightly and he will increase by 2 units every 5 days until his blood sugars are consistently below 150 in the morning.  Watch for hypoglycemic episodes less than 75.

## 2022-04-22 LAB — MICROALBUMIN / CREATININE URINE RATIO
Creatinine, Urine: 52.5 mg/dL
Microalb/Creat Ratio: 122 mg/g creat — ABNORMAL HIGH (ref 0–29)
Microalbumin, Urine: 63.8 ug/mL

## 2022-04-22 LAB — LIPID PANEL
Chol/HDL Ratio: 3.2 ratio (ref 0.0–5.0)
Cholesterol, Total: 165 mg/dL (ref 100–199)
HDL: 51 mg/dL (ref >39)
LDL Chol Calc (NIH): 83 mg/dL (ref 0–99)
Triglycerides: 183 mg/dL — ABNORMAL HIGH (ref 0–149)
VLDL Cholesterol Cal: 31 mg/dL (ref 5–40)

## 2022-04-22 LAB — CBC WITH DIFFERENTIAL/PLATELET
Basophils Absolute: 0 10*3/uL (ref 0.0–0.2)
Basos: 0 %
EOS (ABSOLUTE): 0.1 10*3/uL (ref 0.0–0.4)
Eos: 1 %
Hematocrit: 45.2 % (ref 37.5–51.0)
Hemoglobin: 14.5 g/dL (ref 13.0–17.7)
Immature Grans (Abs): 0 10*3/uL (ref 0.0–0.1)
Immature Granulocytes: 1 %
Lymphocytes Absolute: 2 10*3/uL (ref 0.7–3.1)
Lymphs: 27 %
MCH: 28.8 pg (ref 26.6–33.0)
MCHC: 32.1 g/dL (ref 31.5–35.7)
MCV: 90 fL (ref 79–97)
Monocytes Absolute: 0.6 10*3/uL (ref 0.1–0.9)
Monocytes: 8 %
Neutrophils Absolute: 4.7 10*3/uL (ref 1.4–7.0)
Neutrophils: 63 %
Platelets: 234 10*3/uL (ref 150–450)
RBC: 5.04 x10E6/uL (ref 4.14–5.80)
RDW: 12.8 % (ref 11.6–15.4)
WBC: 7.5 10*3/uL (ref 3.4–10.8)

## 2022-04-22 LAB — CMP14+EGFR
ALT: 14 IU/L (ref 0–44)
AST: 9 IU/L (ref 0–40)
Albumin/Globulin Ratio: 1.9 (ref 1.2–2.2)
Albumin: 4.3 g/dL (ref 3.8–4.8)
Alkaline Phosphatase: 101 IU/L (ref 44–121)
BUN/Creatinine Ratio: 17 (ref 10–24)
BUN: 25 mg/dL (ref 8–27)
Bilirubin Total: 0.3 mg/dL (ref 0.0–1.2)
CO2: 20 mmol/L (ref 20–29)
Calcium: 9.8 mg/dL (ref 8.6–10.2)
Chloride: 105 mmol/L (ref 96–106)
Creatinine, Ser: 1.47 mg/dL — ABNORMAL HIGH (ref 0.76–1.27)
Globulin, Total: 2.3 g/dL (ref 1.5–4.5)
Glucose: 193 mg/dL — ABNORMAL HIGH (ref 70–99)
Potassium: 4.5 mmol/L (ref 3.5–5.2)
Sodium: 140 mmol/L (ref 134–144)
Total Protein: 6.6 g/dL (ref 6.0–8.5)
eGFR: 49 mL/min/{1.73_m2} — ABNORMAL LOW (ref >59)

## 2022-04-24 ENCOUNTER — Ambulatory Visit (INDEPENDENT_AMBULATORY_CARE_PROVIDER_SITE_OTHER): Payer: Medicare Other

## 2022-04-24 VITALS — Ht 67.0 in | Wt 175.0 lb

## 2022-04-24 DIAGNOSIS — Z0000 Encounter for general adult medical examination without abnormal findings: Secondary | ICD-10-CM | POA: Diagnosis not present

## 2022-04-24 NOTE — Progress Notes (Signed)
Subjective:   Chrstopher Malenfant is a 77 y.o. male who presents for Medicare Annual/Subsequent preventive examination. I connected with  [name] on 04/24/22 by a audio enabled telemedicine application and verified that I am speaking with the correct person using two identifiers.  Patient Location: Home  Provider Location: Home Office  I discussed the limitations of evaluation and management by telemedicine. The patient expressed understanding and agreed to proceed.  Review of Systems     Cardiac Risk Factors include: advanced age (>55mn, >>50women);diabetes mellitus;hypertension;male gender;dyslipidemia     Objective:    Today's Vitals   04/24/22 1132  Weight: 175 lb (79.4 kg)  Height: '5\' 7"'$  (1.702 m)   Body mass index is 27.41 kg/m.     04/24/2022   11:39 AM 03/14/2022   10:16 AM 07/28/2021    1:18 PM 04/06/2021   11:10 AM 04/05/2020   12:51 PM 03/21/2017    1:37 PM 01/11/2017    8:34 AM  Advanced Directives  Does Patient Have a Medical Advance Directive? No No No No No No No  Would patient like information on creating a medical advance directive? No - Patient declined Yes (MAU/Ambulatory/Procedural Areas - Information given) No - Patient declined No - Patient declined Yes (MAU/Ambulatory/Procedural Areas - Information given) Yes (ED - Information included in AVS) No - Patient declined    Current Medications (verified) Outpatient Encounter Medications as of 04/24/2022  Medication Sig   amLODipine (NORVASC) 5 MG tablet Take 1 tablet (5 mg total) by mouth daily.   aspirin 81 MG tablet Take 81 mg by mouth daily.   b complex vitamins capsule Take 1 capsule by mouth daily.   Blood Glucose Monitoring Suppl (ACCU-CHEK AVIVA PLUS) w/Device KIT USE to check blood sugars daily   Cholecalciferol (VITAMIN D3) 2000 units TABS Take 4,000 Units by mouth every morning.   dapagliflozin propanediol (FARXIGA) 10 MG TABS tablet Take 1 tablet (10 mg total) by mouth daily before  breakfast.   famotidine (PEPCID) 20 MG tablet Take 1 tablet (20 mg total) by mouth 2 (two) times daily.   glucose blood (ACCU-CHEK AVIVA PLUS) test strip TEST BLOOD SUGAR 3 times DAILY DX E11.69   insulin degludec (TRESIBA FLEXTOUCH) 100 UNIT/ML FlexTouch Pen Inject 10-20 Units into the skin at bedtime.   irbesartan (AVAPRO) 300 MG tablet TAKE 1 TABLET DAILY   meclizine (ANTIVERT) 25 MG tablet Take 1 tablet (25 mg total) by mouth 3 (three) times daily as needed for dizziness.   Omega-3 Fatty Acids (FISH OIL) 1000 MG CAPS Take 1,000 mg by mouth in the morning and at bedtime.   pravastatin (PRAVACHOL) 20 MG tablet TAKE 1 TABLET DAILY   Probiotic Product (PROBIOTIC-10 PO) Take 1 capsule by mouth daily.   Psyllium (METAMUCIL) 28.3 % POWD Take 3 Scoops by mouth daily.   tamsulosin (FLOMAX) 0.4 MG CAPS capsule Take 1 capsule (0.4 mg total) by mouth daily.   TRUEplus Lancets 28G MISC Use to check blood sugar once daily and PRN Dx. E11.9   No facility-administered encounter medications on file as of 04/24/2022.    Allergies (verified) Hydrocodone-acetaminophen and Ozempic (0.25 or 0.5 mg-dose) [semaglutide(0.25 or 0.'5mg'$ -dos)]   History: Past Medical History:  Diagnosis Date   Asthmatic bronchitis    BPH (benign prostatic hypertrophy)    Cataracts, bilateral    Colon polyps    Diabetes mellitus    Diverticulosis    Fibromyalgia    GERD (gastroesophageal reflux disease)  Gout    History of nephrectomy, unilateral    left    Hyperlipidemia    Hypertension    Single kidney    Past Surgical History:  Procedure Laterality Date   APPENDECTOMY     CHOLECYSTECTOMY     COLONOSCOPY W/ POLYPECTOMY     EYE SURGERY     catarat - right    hand fracture Left    plate in right hand from when he crushed it in a hay baler   LEFT HEART CATH AND CORONARY ANGIOGRAPHY N/A 01/11/2017   Procedure: LEFT HEART CATH AND CORONARY ANGIOGRAPHY;  Surgeon: Martinique, Peter M, MD;  Location: [facility];   Service: Cardiovascular;  Laterality: N/A;   LUMBAR LAMINECTOMY N/A 07/29/2021   Procedure: RIGHT L3-4 MICRODISCECTOMY, and removal of fragment;  Surgeon: Marybelle Killings, MD;  Location: [facility];  Service: Orthopedics;  Laterality: N/A;   NEPHRECTOMY  1996   left , secondary to nephrolithiasis    ROTATOR CUFF REPAIR Right    Family History  Problem Relation Age of Onset   Coronary artery disease Other        family history    Hypertension Other        family history    Alzheimer's disease Mother 68   Cancer Father        prostate   Stroke Father    CAD Father 53       CABG   Hypertension Sister    Hypercalcemia Brother    Hypertension Brother    Kidney Stones Son    Breast cancer Maternal Grandmother    Diabetes Paternal Grandmother    Other Paternal Grandmother        Crite's disease   Suicidality Paternal Grandfather    Social History   Socioeconomic History   Marital status: Married    Spouse name: Pamala Hurry   Number of children: 2   Years of education: Not on file   Highest education level: Not on file  Occupational History   Occupation: retired    Comment: owns a farm  Tobacco Use   Smoking status: Former    Packs/day: 1.00    Years: 12.00    Total pack years: 12.00    Types: Cigarettes    Quit date: 04/03/1970    Years since quitting: 52.0   Smokeless tobacco: Never  Vaping Use   Vaping Use: Never used  Substance and Sexual Activity   Alcohol use: No   Drug use: No   Sexual activity: Not Currently  Other Topics Concern   Not on file  Social History Narrative   Lives with wife on a farm.     Social Determinants of Health   Financial Resource Strain: Low Risk  (04/24/2022)   Overall Financial Resource Strain (CARDIA)    Difficulty of Paying Living Expenses: Not hard at all  Food Insecurity: No Food Insecurity (04/24/2022)   Hunger Vital Sign    Worried About Running Out of Food in the Last Year: Never true    Ran Out of Food in the Last Year: Never true   Transportation Needs: No Transportation Needs (04/24/2022)   PRAPARE - Hydrologist (Medical): No    Lack of Transportation (Non-Medical): No  Physical Activity: Insufficiently Active (04/24/2022)   Exercise Vital Sign    Days of Exercise per Week: 3 days    Minutes of Exercise per Session: 30 min  Stress: No Stress Concern Present (  04/24/2022)   [place]    Feeling of Stress : Not at all  Social Connections: Moderately Integrated (04/24/2022)   Social Connection and Isolation Panel [NHANES]    Frequency of Communication with Friends and Family: More than three times a week    Frequency of Social Gatherings with Friends and Family: More than three times a week    Attends Religious Services: More than 4 times per year    Active Member of [employer] or Organizations: No    Attends Music therapist: Never    Marital Status: Married    Tobacco Counseling Counseling given: Not Answered   Clinical Intake:  Pre-visit preparation completed: Yes  Pain : No/denies pain     Nutritional Risks: None Diabetes: Yes CBG done?: No Did pt. bring in CBG monitor from home?: No  How often do you need to have someone help you when you read instructions, pamphlets, or other written materials from your doctor or pharmacy?: 1 - Never  Diabetic?yes Nutrition Risk Assessment:  Has the patient had any N/V/D within the last 2 months?  No  Does the patient have any non-healing wounds?  No  Has the patient had any unintentional weight loss or weight gain?  No   Diabetes:  Is the patient diabetic?  Yes  If diabetic, was a CBG obtained today?  No  Did the patient bring in their glucometer from home?  No  How often do you monitor your CBG's? Daily .   Financial Strains and Diabetes Management:  Are you having any financial strains with the device, your supplies or your medication? No .  Does  the patient want to be seen by Chronic Care Management for management of their diabetes?  No  Would the patient like to be referred to a Nutritionist or for Diabetic Management?  No   Diabetic Exams:  Diabetic Eye Exam: Completed 07/2021 Diabetic Foot Exam: Overdue, Pt has been advised about the importance in completing this exam. Pt is scheduled for diabetic foot exam on next office visit .   Interpreter Needed?: No  Information entered by :: Jadene Pierini, LPN   Activities of Daily Living    04/24/2022   11:40 AM 07/28/2021    1:20 PM  In your present state of health, do you have any difficulty performing the following activities:  Hearing? 0   Vision? 0   Difficulty concentrating or making decisions? 0   Walking or climbing stairs? 0   Dressing or bathing? 0   Doing errands, shopping? 0 0  Preparing Food and eating ? N   Using the Toilet? N   In the past six months, have you accidently leaked urine? N   Do you have problems with loss of bowel control? N   Managing your Medications? N   Managing your Finances? N   Housekeeping or managing your Housekeeping? N     Patient Care Team: Dettinger, Fransisca Kaufmann, MD as PCP - General (Family Medicine) Leroy Sea, MD as Referring Physician (Urology) Minus Breeding, MD as Consulting Physician (Cardiology) Danice Goltz, MD as Consulting Physician (Ophthalmology) Jose Persia, Kathalene Frames, MD as Referring Physician (Gastroenterology) Marybelle Killings, MD as Consulting Physician (Orthopedic Surgery) Lavera Guise, [facility] as Pharmacist (Family Medicine)  Indicate any recent Medical Services you may have received from other than [facility] providers in the past year (date may be approximate).     Assessment:   This is  a routine wellness examination for Jaydis.  Hearing/Vision screen Vision Screening - Comments:: Wears rx glasses - up to date with routine eye exams with  Dr.Shaw  Dietary issues and exercise activities discussed: Current  Exercise Habits: Home exercise routine, Type of exercise: walking, Time (Minutes): 30, Frequency (Times/Week): 3, Weekly Exercise (Minutes/Week): 90, Intensity: Mild, Exercise limited by: orthopedic condition(s)   Goals Addressed             This Visit's Progress    AWV   On track    04/05/2020 AWV Goal: Fall Prevention  Over the next year, patient will decrease their risk for falls by: Using assistive devices, such as a cane or walker, as needed Identifying fall risks within their home and correcting them by: Removing throw rugs Adding handrails to stairs or ramps Removing clutter and keeping a clear pathway throughout the home Increasing light, especially at night Adding shower handles/bars Raising toilet seat Identifying potential personal risk factors for falls: Medication side effects Incontinence/urgency Vestibular dysfunction Hearing loss Musculoskeletal disorders Neurological disorders Orthostatic hypotension         Depression Screen    04/24/2022   11:38 AM 04/21/2022   10:25 AM 03/14/2022    9:54 AM 01/16/2022   10:55 AM 10/13/2021   10:30 AM 07/07/2021    9:27 AM 06/09/2021    4:04 PM  PHQ 2/9 Scores  PHQ - 2 Score 0 0 0 0 0 0 0  PHQ- 9 Score 0 0    0     Fall Risk    04/24/2022   11:33 AM 04/21/2022   10:25 AM 03/14/2022    9:55 AM 01/16/2022   10:55 AM 10/13/2021   10:30 AM  Fall Risk   Falls in the past year? 0 0 0 0 0  Number falls in past yr: 0      Injury with Fall? 0      Risk for fall due to : No Fall Risks      Follow up Falls prevention discussed        [place]:  Any stairs in or around the home? Yes  If so, are there any without handrails? No  Home free of loose throw rugs in walkways, pet beds, electrical cords, etc? Yes  Adequate lighting in your home to reduce risk of falls? Yes   ASSISTIVE DEVICES UTILIZED TO PREVENT FALLS:  Life alert? No  Use of a cane, walker or w/c? No  Grab bars in the  bathroom? Yes  Shower chair or bench in shower? Yes  Elevated toilet seat or a handicapped toilet? Yes       03/21/2017    5:01 PM 03/08/2016    3:53 PM 01/01/2015   11:11 AM  MMSE - Mini Mental State Exam  Orientation to time '5 5 5  '$ Orientation to Place '5 5 5  '$ Registration '3 3 3  '$ Attention/ Calculation '5 5 5  '$ Recall '3 2 1  '$ Language- name 2 objects '2 2 2  '$ Language- repeat '1 1 1  '$ Language- follow 3 step command '3 3 3  '$ Language- read & follow direction '1 1 1  '$ Write a sentence '1 1 1  '$ Copy design '1 1 1  '$ Total score '30 29 28        '$ 04/24/2022   11:41 AM 04/06/2021   11:25 AM 04/05/2020   11:44 AM  6CIT Screen  What Year? 0 points 0 points 0 points  What month? 0 points 0 points 0 points  What time? 0 points 0 points 0 points  Count back from 20 0 points 0 points 0 points  Months in reverse 0 points 0 points 0 points  Repeat phrase 0 points 2 points 4 points  Total Score 0 points 2 points 4 points    Immunizations Immunization History  Administered Date(s) Administered   Fluad Quad(high Dose 65+) 02/02/2019, 12/31/2019, 12/29/2020, 01/16/2022   Influenza, High Dose Seasonal PF 01/15/2017, 03/13/2018   Influenza,inj,Quad PF,6+ Mos 01/28/2013, 01/28/2014, 01/11/2015   Moderna SARS-COV2 Booster Vaccination 04/28/2020   Moderna Sars-Covid-2 Vaccination 05/05/2019, 06/02/2019   Pneumococcal Conjugate-13 01/28/2013   Pneumococcal Polysaccharide-23 01/01/2015   Tdap 09/27/2020   Zoster Recombinat (Shingrix) 03/21/2017, 09/06/2017   Zoster, Live 01/09/2013    TDAP status: Up to date  Flu Vaccine status: Up to date  Pneumococcal vaccine status: Up to date  Covid-19 vaccine status: Completed vaccines  Qualifies for Shingles Vaccine? Yes   Zostavax completed Yes   Shingrix Completed?: Yes  Screening Tests Health Maintenance  Topic Date Due   COVID-19 Vaccine (4 - 2023-24 season) 12/02/2021   FOOT EXAM  07/08/2022   OPHTHALMOLOGY EXAM  07/27/2022   HEMOGLOBIN A1C   10/20/2022   Diabetic kidney evaluation - eGFR measurement  04/22/2023   Diabetic kidney evaluation - Urine ACR  04/22/2023   Medicare Annual Wellness (AWV)  04/25/2023   COLONOSCOPY (Pts 45-46yr Insurance coverage will need to be confirmed)  03/24/2027   DTaP/Tdap/Td (2 - Td or Tdap) 09/28/2030   Pneumonia Vaccine 77 Years old  Completed   INFLUENZA VACCINE  Completed   Hepatitis C Screening  Completed   Zoster Vaccines- Shingrix  Completed   HPV VACCINES  Aged Out    Health Maintenance  Health Maintenance Due  Topic Date Due   COVID-19 Vaccine (4 - 2023-24 season) 12/02/2021    Colorectal cancer screening: Type of screening: Colonoscopy. Completed 03/23/2022. Repeat every 5 years  Lung Cancer Screening: (Low Dose CT Chest recommended if Age 957-80years, 30 pack-year currently smoking OR have quit w/in 15years.) does not qualify.   Lung Cancer Screening Referral: n/a  Additional Screening:  Hepatitis C Screening: does not qualify; Completed 09/29/2015  Vision Screening: Recommended annual ophthalmology exams for early detection of glaucoma and other disorders of the eye. Is the patient up to date with their annual eye exam?  Yes  Who is the provider or what is the name of the office in which the patient attends annual eye exams? Dr.Shaw If pt is not established with a provider, would they like to be referred to a provider to establish care? No .   Dental Screening: Recommended annual dental exams for proper oral hygiene  Community Resource Referral / Chronic Care Management: CRR required this visit?  No   CCM required this visit?  No      Plan:     I have personally reviewed and noted the following in the patient's chart:   Medical and social history Use of alcohol, tobacco or illicit drugs  Current medications and supplements including opioid prescriptions. Patient is not currently taking opioid prescriptions. Functional ability and status Nutritional  status Physical activity Advanced directives List of other physicians Hospitalizations, surgeries, and ER visits in previous 12 months Vitals Screenings to include cognitive, depression, and falls Referrals and appointments  In addition, I have reviewed and discussed with patient certain preventive protocols, quality metrics, and best practice recommendations. A written personalized care  plan for preventive services as well as general preventive health recommendations were provided to patient.     Daphane Shepherd, LPN   7/74/1287   Nurse Notes: none

## 2022-04-24 NOTE — Patient Instructions (Signed)
[name] , Thank you for taking time to come for your Medicare Wellness Visit. I appreciate your ongoing commitment to your health goals. Please review the following plan we discussed and let me know if I can assist you in the future.   These are the goals we discussed:  Goals       AWV      04/05/2020 AWV Goal: Fall Prevention  Over the next year, patient will decrease their risk for falls by: Using assistive devices, such as a cane or walker, as needed Identifying fall risks within their home and correcting them by: Removing throw rugs Adding handrails to stairs or ramps Removing clutter and keeping a clear pathway throughout the home Increasing light, especially at night Adding shower handles/bars Raising toilet seat Identifying potential personal risk factors for falls: Medication side effects Incontinence/urgency Vestibular dysfunction Hearing loss Musculoskeletal disorders Neurological disorders Orthostatic hypotension        CCM (DIABETES) EXPECTED OUTCOME:  MONITOR, SELF-MANAGE AND REDUCE SYMPTOMS OF DIABETES      Current Barriers:  Knowledge Deficits related to Diabetes management Chronic Disease Management support and education needs related to Diabetes and diet No Advanced Directives in place- pt requests information be mailed Patient reports he checks CBG BID and since being on Farxiga blood sugars have improved with fasting ranges now 142-159, random ranges 130-140's, pt is working with [facility] pharmacist 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 Patient tries to follow carbohydrate modified diet, does not drink sugary drinks  Planned Interventions: Provided education to patient about basic DM disease process; Reviewed medications with patient and discussed importance of medication adherence;        Reviewed prescribed diet with patient carbohydrate modified; Counseled on importance of regular laboratory monitoring as prescribed;        Discussed plans  with patient for ongoing care management follow up and provided patient with direct contact information for care management team;      Provided patient with written educational materials related to hypo and hyperglycemia and importance of correct treatment;       Reviewed scheduled/upcoming provider appointments including: pharmacist 03/23/22, primary care provider 04/21/22;         Advised patient, providing education and rationale, to check cbg twice daily and record        call provider for findings outside established parameters;       Screening for signs and symptoms of depression related to chronic disease state;        Assessed social determinant of health barriers;        Advanced directives packet mailed  Symptom Management: Take medications as prescribed   Attend all scheduled provider appointments Call pharmacy for medication refills 3-7 days in advance of running out of medications Attend church or other social activities Perform all self care activities independently  Perform IADL's (shopping, preparing meals, housekeeping, managing finances) independently Call provider office for new concerns or questions  check blood sugar at prescribed times: twice daily check feet daily for cuts, sores or redness enter blood sugar readings and medication or insulin into daily log take the blood sugar log to all doctor visits take the blood sugar meter to all doctor visits trim toenails straight across fill half of plate with vegetables limit fast food meals to no more than 1 per week manage portion size read food labels for fat, fiber, carbohydrates and portion size keep feet up while sitting wash and dry feet carefully every day  Advanced directives packet mailed Look over education sent via my chart - hypoglycemia  Follow Up Plan: Telephone follow up appointment with care management team member scheduled for:  05/17/21 at 9 am          CCM (HYPERTENSION) EXPECTED OUTCOME: MONITOR,  SELF-MANAGE AND REDUCE SYMPTOMS OF HYPERTENSION      Current Barriers:  Knowledge Deficits related to Hypertension management Chronic Disease Management support and education needs related to Hypertension Patient reports he lives with spouse, is independent with all aspects of his care, is active on his farm.   Patient states he checks blood pressure on occasion and readings are within normal limits  Planned Interventions: Evaluation of current treatment plan related to hypertension self management and patient's adherence to plan as established by provider;   Reviewed prescribed diet low sodium Reviewed medications with patient and discussed importance of compliance;  Counseled on the importance of exercise goals with target of 150 minutes per week Discussed plans with patient for ongoing care management follow up and provided patient with direct contact information for care management team; Advised patient, providing education and rationale, to monitor blood pressure daily and record, calling PCP for findings outside established parameters;  Provided education on prescribed diet low sodium;  Discussed complications of poorly controlled blood pressure such as heart disease, stroke, circulatory complications, vision complications, kidney impairment, sexual dysfunction;  Screening for signs and symptoms of depression related to chronic disease state;  Assessed social determinant of health barriers;   Symptom Management: Take medications as prescribed   Attend all scheduled provider appointments Call pharmacy for medication refills 3-7 days in advance of running out of medications Attend church or other social activities Perform all self care activities independently  Perform IADL's (shopping, preparing meals, housekeeping, managing finances) independently Call provider office for new concerns or questions  check blood pressure weekly choose a place to take my blood pressure (home, clinic or  office, retail store) write blood pressure results in a log or diary learn about high blood pressure keep a blood pressure log take blood pressure log to all doctor appointments call doctor for signs and symptoms of high blood pressure keep all doctor appointments take medications for blood pressure exactly as prescribed eat more whole grains, fruits and vegetables, lean meats and healthy fats Look over education sent via my chart- low sodium diet  Follow Up Plan: Telephone follow up appointment with care management team member scheduled for: 05/17/22 at 9 am        Chronic Disease Management Needs      CARE PLAN ENTRY (see longtitudinal plan of care for additional care plan information)  Current Barriers:  Chronic Disease Management support, education, and care coordination needs related to HTN, DM, HLD, fibromyalgia  Clinical Goal(s) related to HTN, DM, HLD, fibromyalgia:  Over the next 90 days, patient will:  Continue to work with PCP and [facility] PharmD to manage HTN, DM, HLD, and fibromyalgia Continue to check blood sugar and blood pressure as instructed  Interventions related to HTN, DM, HLD, fibromyalgia:  Chart reviewed including recent office and telephone notes as well as lab results Discussed improved A1C Medications reviewed Discussed approval for Ozempic prescription assistance Confirmed that patient is using Ozempic Encouraged patient to continue following up with PharmD and PCP  Encouraged patient to continue current medications Encouraged patient to continue monitoring and reporting blood sugar and blood pressure readings to PCP Encouraged patient to reach out to CCM team if care management or care  coordination needs arise  Patient Self Care Activities related to HTN, DM, HLD, fibromyalgia:  Patient is able to perform ADLs and IADLs independently Patient is able to manage chronic medical conditions well at this time  Please see past updates related to this goal by  clicking on the "Past Updates" button in the selected goal        T2DM, 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   Interventions: 1:1 collaboration with Dettinger, Fransisca Kaufmann, MD regarding development and update of comprehensive plan of care as evidenced by provider attestation and co-signature Inter-disciplinary care team collaboration (see longitudinal plan of care) Comprehensive medication review performed; medication list updated in electronic medical record  Diabetes: New goal. Uncontrolled; A1c 7.7%, GFR 58 Current treatment: Most recently on Ozempic + increased metformin dose (GI discontinued Ozempic)--> PCP started low dose Farxiga '5mg'$  daily in the AM GI issues with diarrhea/abdominal bloating--seeing GI for colonoscopy in 03/2022, pt self reports history of "tricky" bowel,  s/p cholecystectomy (remote HX) Notes mild improvement in GI symptomatology when Ozempic was stopped (documented intolerance in EMR) States GI requested to hold GLP1 due to pancreatic insufficiency --> pt now on Creon  back pain issues --> multiple rounds of steroids He has had several episodes of incontinence due to the inability to get to the bathroom in time.  No increased episodes with addition of Wilder Glade  We are unable to use DPP4, GLP1/GIP therapy at this time (will re-address once cleared by GI).   Discussed with patient and wife the possibility we may have to use insulin to control blood sugar.  TZDs/sulfonylureas would not provide the glycemic control and outcomes we are striving for.  Discussed addition of insulin if we are unable to use any other categories of medications be beneficial  Current glucose readings: fasting glucose: 200s,  post prandial glucose: n/a Reports hyperglycemic symptoms Discussed meal planning options and Plate method for healthy eating Avoid sugary drinks and desserts Incorporate balanced protein, non starchy veggies, 1 serving of carbohydrate with each meal Increase water intake Increase physical activity as able Current exercise: encouraged --only as able Recommended increase in [place] '10mg'$  daily in the AM for now (samples left up front #1 month); patient to check FBG and post prandials; Reassess procedures and GI recommendations/considerations before altering T2DM therapy  Chronic kidney disease stage 3a -most recent GFR 38 -farxiga increased to '10mg'$  daily to provide additional glycemic control and protection -avoid NSAIDS -on ARB  Patient Goals/Self-Care Activities patient will:  - take medications as prescribed as evidenced by patient report and record review check glucose fasting & 1-2 hours after meals daily, document, and provide at future appointments collaborate with provider on medication access solutions target a minimum of 150 minutes of moderate intensity exercise weekly engage in dietary modifications by FOLLOWING A HEART HEALTHY DIET/HEALTHY PLATE METHOD        Weight < 165 lb (74.844 kg)        This is a list of the screening recommended for you and due dates:  Health Maintenance  Topic Date Due   COVID-19 Vaccine (4 - 2023-24 season) 12/02/2021   Complete foot exam   07/08/2022   Eye exam for diabetics  07/27/2022   Hemoglobin A1C  10/20/2022  Yearly kidney function blood test for diabetes  04/22/2023   Yearly kidney health urinalysis for diabetes  04/22/2023   Medicare Annual Wellness Visit  04/25/2023   Colon Cancer Screening  03/24/2027   DTaP/Tdap/Td vaccine (2 - Td or Tdap) 09/28/2030   Pneumonia Vaccine  Completed   Flu Shot  Completed   Hepatitis C Screening: USPSTF Recommendation to screen - Ages 18-79 yo.  Completed   Zoster (Shingles) Vaccine   Completed   HPV Vaccine  Aged Out    Advanced directives: Advance directive discussed with you today. I have provided a copy for you to complete at home and have notarized. Once this is complete please bring a copy in to our office so we can scan it into your chart.   Conditions/risks identified: Aim for 30 minutes of exercise or brisk walking, 6-8 glasses of water, and 5 servings of fruits and vegetables each day.   Next appointment: Follow up in one year for your annual wellness visit.   Preventive Care 19 Years and Older, Male  Preventive care refers to lifestyle choices and visits with your health care provider that can promote health and wellness. What does preventive care include? A yearly physical exam. This is also called an annual well check. Dental exams once or twice a year. Routine eye exams. Ask your health care provider how often you should have your eyes checked. Personal lifestyle choices, including: Daily care of your teeth and gums. Regular physical activity. Eating a healthy diet. Avoiding tobacco and drug use. Limiting alcohol use. Practicing safe sex. Taking low doses of aspirin every day. Taking vitamin and mineral supplements as recommended by your health care provider. What happens during an annual well check? The services and screenings done by your health care provider during your annual well check will depend on your age, overall health, lifestyle risk factors, and family history of disease. Counseling  Your health care provider may ask you questions about your: Alcohol use. Tobacco use. Drug use. Emotional well-being. Home and relationship well-being. Sexual activity. Eating habits. History of falls. Memory and ability to understand (cognition). Work and work Statistician. Screening  You may have the following tests or measurements: Height, weight, and BMI. Blood pressure. Lipid and cholesterol levels. These may be checked every 5 years, or more  frequently if you are over 60 years old. Skin check. Lung cancer screening. You may have this screening every year starting at age 44 if you have a 30-pack-year history of smoking and currently smoke or have quit within the past 15 years. Fecal occult blood test (FOBT) of the stool. You may have this test every year starting at age 51. Flexible sigmoidoscopy or colonoscopy. You may have a sigmoidoscopy every 5 years or a colonoscopy every 10 years starting at age 91. Prostate cancer screening. Recommendations will vary depending on your family history and other risks. Hepatitis C blood test. Hepatitis B blood test. Sexually transmitted disease (STD) testing. Diabetes screening. This is done by checking your blood sugar (glucose) after you have not eaten for a while (fasting). You may have this done every 1-3 years. Abdominal aortic aneurysm (AAA) screening. You may need this if you are a current or former smoker. Osteoporosis. You may be screened starting at age 16 if you are at high risk. Talk with your health care provider about your test results, treatment options, and if necessary, the need for more tests. Vaccines  Your health care provider may recommend certain vaccines, such as: Influenza  vaccine. This is recommended every year. Tetanus, diphtheria, and acellular pertussis (Tdap, Td) vaccine. You may need a Td booster every 10 years. Zoster vaccine. You may need this after age 47. Pneumococcal 13-valent conjugate (PCV13) vaccine. One dose is recommended after age 33. Pneumococcal polysaccharide (PPSV23) vaccine. One dose is recommended after age 65. Talk to your health care provider about which screenings and vaccines you need and how often you need them. This information is not intended to replace advice given to you by your health care provider. Make sure you discuss any questions you have with your health care provider. Document Released: 04/16/2015 Document Revised: 12/08/2015  Document Reviewed: 01/19/2015 Elsevier Interactive Patient Education  2017 [place] Prevention in the Home Falls can cause injuries. They can happen to people of all ages. There are many things you can do to make your home safe and to help prevent falls. What can I do on the outside of my home? Regularly fix the edges of walkways and driveways and fix any cracks. Remove anything that might make you trip as you walk through a door, such as a raised step or threshold. Trim any bushes or trees on the path to your home. Use bright outdoor lighting. Clear any walking paths of anything that might make someone trip, such as rocks or tools. Regularly check to see if handrails are loose or broken. Make sure that both sides of any steps have handrails. Any raised decks and porches should have guardrails on the edges. Have any leaves, snow, or ice cleared regularly. Use sand or salt on walking paths during winter. Clean up any spills in your garage right away. This includes oil or grease spills. What can I do in the bathroom? Use night lights. Install grab bars by the toilet and in the tub and shower. Do not use towel bars as grab bars. Use non-skid mats or decals in the tub or shower. If you need to sit down in the shower, use a plastic, non-slip stool. Keep the floor dry. Clean up any water that spills on the floor as soon as it happens. Remove soap buildup in the tub or shower regularly. Attach bath mats securely with double-sided non-slip rug tape. Do not have throw rugs and other things on the floor that can make you trip. What can I do in the bedroom? Use night lights. Make sure that you have a light by your bed that is easy to reach. Do not use any sheets or blankets that are too big for your bed. They should not hang down onto the floor. Have a firm chair that has side arms. You can use this for support while you get dressed. Do not have throw rugs and other things on the floor  that can make you trip. What can I do in the kitchen? Clean up any spills right away. Avoid walking on wet floors. Keep items that you use a lot in easy-to-reach places. If you need to reach something above you, use a strong step stool that has a grab bar. Keep electrical cords out of the way. Do not use floor polish or wax that makes floors slippery. If you must use wax, use non-skid floor wax. Do not have throw rugs and other things on the floor that can make you trip. What can I do with my stairs? Do not leave any items on the stairs. Make sure that there are handrails on both sides of the stairs and use them. Fix  handrails that are broken or loose. Make sure that handrails are as long as the stairways. Check any carpeting to make sure that it is firmly attached to the stairs. Fix any carpet that is loose or worn. Avoid having throw rugs at the top or bottom of the stairs. If you do have throw rugs, attach them to the floor with carpet tape. Make sure that you have a light switch at the top of the stairs and the bottom of the stairs. If you do not have them, ask someone to add them for you. What else can I do to help prevent falls? Wear shoes that: Do not have high heels. Have rubber bottoms. Are comfortable and fit you well. Are closed at the toe. Do not wear sandals. If you use a stepladder: Make sure that it is fully opened. Do not climb a closed stepladder. Make sure that both sides of the stepladder are locked into place. Ask someone to hold it for you, if possible. Clearly mark and make sure that you can see: Any grab bars or handrails. First and last steps. Where the edge of each step is. Use tools that help you move around (mobility aids) if they are needed. These include: Canes. Walkers. Scooters. Crutches. Turn on the lights when you go into a dark area. Replace any light bulbs as soon as they burn out. Set up your furniture so you have a clear path. Avoid moving your  furniture around. If any of your floors are uneven, fix them. If there are any pets around you, be aware of where they are. Review your medicines with your doctor. Some medicines can make you feel dizzy. This can increase your chance of falling. Ask your doctor what other things that you can do to help prevent falls. This information is not intended to replace advice given to you by your health care provider. Make sure you discuss any questions you have with your health care provider. Document Released: 01/14/2009 Document Revised: 08/26/2015 Document Reviewed: 04/24/2014 Elsevier Interactive Patient Education  2017 [employer].

## 2022-05-09 DIAGNOSIS — K529 Noninfective gastroenteritis and colitis, unspecified: Secondary | ICD-10-CM | POA: Diagnosis not present

## 2022-05-09 DIAGNOSIS — Z79899 Other long term (current) drug therapy: Secondary | ICD-10-CM | POA: Diagnosis not present

## 2022-05-09 DIAGNOSIS — T887XXA Unspecified adverse effect of drug or medicament, initial encounter: Secondary | ICD-10-CM | POA: Diagnosis not present

## 2022-05-17 ENCOUNTER — Ambulatory Visit (INDEPENDENT_AMBULATORY_CARE_PROVIDER_SITE_OTHER): Payer: Medicare Other | Admitting: *Deleted

## 2022-05-17 DIAGNOSIS — E1159 Type 2 diabetes mellitus with other circulatory complications: Secondary | ICD-10-CM

## 2022-05-17 DIAGNOSIS — E1169 Type 2 diabetes mellitus with other specified complication: Secondary | ICD-10-CM

## 2022-05-17 NOTE — Patient Instructions (Signed)
Please call the care guide team at [phone] if you need to cancel or reschedule your appointment.   If you are experiencing a Mental Health or [place] or need someone to talk to, please call the Suicide and Crisis Lifeline: 988 call the [place] National Suicide Prevention Lifeline: 313-167-7831 or TTY: 646 755 1620 TTY 330-620-8834) to talk to a trained counselor call 1-800-273-TALK (toll free, 24 hour hotline) go to [facility] [address], [place] [phone]) call the [facility]: [phone] call 911   Following is a copy of the CCM Program Consent:  CCM service includes personalized support from designated clinical staff supervised by the physician, including individualized plan of care and coordination with other care providers 24/7 contact phone numbers for assistance for urgent and routine care needs. Service will only be billed when office clinical staff spend 20 minutes or more in a month to coordinate care. Only one practitioner may furnish and bill the service in a calendar month. The patient may stop CCM services at amy time (effective at the end of the month) by phone call to the office staff. The patient will be responsible for cost sharing (co-pay) or up to 20% of the service fee (after annual deductible is met)  Following is a copy of your full provider care plan:   Goals Addressed             This Visit's Progress    CCM (DIABETES) EXPECTED OUTCOME:  MONITOR, SELF-MANAGE AND REDUCE SYMPTOMS OF DIABETES       Current Barriers:  Knowledge Deficits related to Diabetes management Chronic Disease Management support and education needs related to Diabetes and diet No Advanced Directives in place- pt requests information be mailed Patient reports he checks CBG BID and since being on Farxiga blood sugars have improved with fasting ranges now 105-159 with occasional reading around 170 (depending  on what pt ate), recently has not been checking randomly, pt has been working with [facility] pharmacist 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 Patient tries to follow carbohydrate modified diet, does not drink sugary drinks  Planned Interventions: Reviewed medications with patient and discussed importance of medication adherence;        Reviewed prescribed diet with patient carbohydrate modified; Counseled on importance of regular laboratory monitoring as prescribed;        Reviewed scheduled/upcoming provider appointments including:  primary care provider 07/21/22;         Advised patient, providing education and rationale, to check cbg twice daily and record        call provider for findings outside established parameters;       Advised patient to discuss any issues with blood sugar, medications with provider;      Reviewed importance of exercise and getting outside and 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  Symptom Management: Take medications as prescribed   Attend all scheduled provider appointments Call pharmacy for medication refills 3-7 days in advance of running out of medications Attend church or other social activities Perform all self care activities independently  Perform IADL's (shopping, preparing meals, housekeeping, managing finances) independently Call provider office for new concerns or questions  check blood sugar at prescribed times: twice daily check feet daily for cuts, sores or redness enter blood sugar readings and medication or insulin into daily log take the blood sugar log to all doctor visits take the blood sugar meter to all doctor visits trim toenails straight across fill half  of plate with vegetables limit fast food meals to no more than 1 per week manage portion size prepare main meal at home 3 to 5 days each week read food labels for fat, fiber, carbohydrates and portion size keep feet up while sitting wash and dry feet carefully every day 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 Walking is good exercise  Follow Up Plan: Telephone follow up appointment with care management team member scheduled for:  08/09/22 at 945 am         CCM (HYPERTENSION) EXPECTED OUTCOME: MONITOR, SELF-MANAGE AND REDUCE SYMPTOMS OF HYPERTENSION       Current Barriers:  Knowledge Deficits related to Hypertension management Chronic Disease Management support and education needs related to Hypertension Patient reports he lives with spouse, is independent with all aspects of his care, 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  Patient states he checks blood pressure on occasion and readings are within normal limits  Planned Interventions: Reviewed medications with patient and discussed importance of compliance;  Counseled on the importance of exercise goals with target of 150 minutes per week Discussed plans with patient for ongoing care management follow up and provided patient with direct contact information for care management team; Advised patient, providing education and rationale, to monitor blood pressure daily and record, calling PCP for findings outside established parameters;  Discussed complications of poorly controlled blood pressure such as heart disease, stroke, circulatory complications, vision complications, kidney impairment, sexual dysfunction;  Reinforced low sodium diet  Symptom Management: Take medications as prescribed   Attend all scheduled provider appointments Call pharmacy for medication refills 3-7 days in advance of running out of medications Attend church or other social activities Perform all self care activities independently  Perform IADL's (shopping, preparing meals, housekeeping, managing finances) independently Call provider office for new concerns or questions  check blood pressure weekly choose a place to take my blood pressure (home, clinic or office, retail store) write blood pressure results in a log or diary learn about  high blood pressure keep a blood pressure log take blood pressure log to all doctor appointments call doctor for signs and symptoms of high blood pressure keep all doctor appointments take medications for blood pressure exactly as prescribed eat more whole grains, fruits and vegetables, lean meats and healthy fats Follow low sodium diet- read food labels  Follow Up Plan: Telephone follow up appointment with care management team member scheduled for:   08/09/22 at 945 am          Patient verbalizes understanding of instructions and care plan provided today and agrees to view in [facility]. Active MyChart status and patient understanding of how to access instructions and care plan via MyChart confirmed with patient.     Telephone follow up appointment with care management team member scheduled for:  08/09/22 at 945 am  Reading [place] that are in packages or containers have a Nutrition Facts panel on the side or back. This is commonly called the food label. The food label helps you make informed food choices by providing information about serving size and the amount of calories and various nutrients in the food. You can check the food label to find out if the food contains high or low amounts of items that you want to limit in your diet. You can also use the food label to see if the food is a good source of the nutrients that you want to include in your diet. How do I read  the food label?  Start by looking at the serving size and servings per package. Check the calories. Check the amount of fat, cholesterol, and sodium. Try to limit these nutrients. Check the amount of dietary fiber, protein, and other vitamins and minerals listed. Depending on recommendations from your health care provider or dietitian, certain values may be more important to your overall health and diet than others. Check the added sugar. This is sugar that was added in the making of the food or drink. This number does  not include sugar that naturally occurs in foods such as milk, fruits, and vegetables. Try to limit added sugar. Children aged 2-18 years and women should limit added sugar to 6 teaspoons (25 g) a day. Men should limit added sugar to 9 teaspoons (36 g) a day. Look at the ingredient list. Depending on your dietary needs, you may need to avoid foods with certain ingredients. Talk to your health care provider or dietitian about what ingredients you should watch for. What does the information on the food label mean? Serving size This indicates the amount of the food that makes up one serving. All of the nutrition information listed on the food label is based on one serving. Serving size may be based on: The number of food pieces. The volume of food (cups, fluid ounces, tablespoons, milliliters). The weight of food (grams, ounces). The label will also indicate how many servings are in one package. If you eat more than one serving, you must multiply the amounts (such as calories, grams of saturated fat, or milligrams of sodium) by the number of servings. Calories Calories are a measure of the amount of energy that your body gets from the food. Most food labels list only the calories in one serving of food. Some foods may list the number of calories per package if one package contains slightly more than one serving. Counting total daily calories is one way that is used to help manage weight. Talk to your health care provider or dietitian about how many calories you should eat each day. Percent daily value Percent Daily Value (%DV) tells you what percent of the daily value for each nutrient one serving provides. The daily value is the recommended total amount of the item that you should get each day. For example, if 15% is listed next to dietary fiber, it means that one serving of the food will give you 15% of the recommended amount of fiber that you should get in a day. The daily values are based on a diet  of 2,000 calories a day. You may get more or less than 2,000 calories in your diet each day, but the %DV gives you an idea of whether the food contains a high or low amount of the listed item. 5% DV or less means there is a low amount of a nutrient in one serving. 20% DV or higher means there is a high amount of a nutrient in one serving. Total fat Total fat shows you the number of grams (g) of fat in one serving. Two of the fats that make up a portion of the total fat are included on the label: Saturated fat. The food label shows both the amount of fat in grams (g) and the percent Daily Value per serving. This type of fat increases the amount of cholesterol in your blood. If you eat 2,000 calories each day, you should eat less than 13 g of saturated fat each day. Trans fat. The food label  shows the number of grams (g) per serving. This type of fat is the most unhealthy fat for heart health. It is recommended that people limit their intake of trans fat to as little as possible. Look for foods that have "0 g Trans Fat" on the label. Cholesterol Cholesterol tells you the number of milligrams (mg) and the percent Daily Value of cholesterol in one serving. Cholesterol is a fat-like substance. It can be harmful if you eat too much of it. Sodium Sodium tells you the number of milligrams (mg) and the percent Daily Value of sodium in one serving. If eaten in large amounts, sodium can raise your blood pressure. Most people should limit their sodium intake to 2,300 mg a day. Total carbohydrate Total carbohydrate shows you the number of grams (g) of carbohydrates in one serving. Two types of carbohydrates make up the total carbohydrates included on the label: Dietary fiber. The food label shows both the amount of dietary fiber in grams (g) and the percent Daily Value per serving. Most adults should eat at least 25 g of dietary fiber each day. Total sugars. The food label shows the number of grams (g) of sugars  per serving. This value includes both naturally occurring sugars, such as those in fruit and milk, and added sugars, such as honey or table sugar. Added sugars. This value is the amount of added sugar. It is part of the total sugar count in the food or drink. Protein Protein tells you how many grams (g) of protein are in one serving. The recommended amount of daily protein differs for men and women, and it may depend on your overall health. Talk to your health care provider or dietitian about how much protein you should eat each day. Vitamins and minerals The food label shows the percent Daily Value for certain vitamins and minerals, including vitamin D, calcium, potassium, and iron. Other vitamins and minerals may be listed depending on the food. Ingredients Food labels list each ingredient in the food. The ingredients are listed in the order of their amount by weight from most to least. Food labels may also include a warning about ingredients that can cause allergic reactions in some people. These may be indicated by the words "Contains" or "May contain." Examples of ingredients that may be listed are wheat, dairy, eggs, soy, and nuts. If a person knows that he or she is allergic to one of these ingredients, he or she will know to avoid that food. Where to find more information [place]. Food and Drug Administration: [employer] Summary The food label is the common term for the Nutrition Facts panel on the side or back of food packages or containers. The food label helps you make informed food choices by providing information about serving size and the amount of calories and various nutrients in the food. To read the food label, begin by checking the serving size and number of servings in the container. Then check the calories and the amount of each listed item. This information is not intended to replace advice given to you by your health care provider. Make sure you discuss any questions you have with  your health care provider. Document Revised: 06/04/2019 Document Reviewed: 06/04/2019 Elsevier Patient Education  [place].

## 2022-05-17 NOTE — Chronic Care Management (AMB) (Signed)
Chronic Care Management   CCM RN Visit Note  05/17/2022 Name: Jeffrey Rubio MRN: [id number] DOB: 1946/01/09  Subjective: Jeffrey Rubio is a 77 y.o. year old male who is a primary care patient of Dettinger, Fransisca Kaufmann, MD. The patient was referred to the Chronic Care Management team for assistance with care management needs subsequent to provider initiation of CCM services and plan of care.    Today's Visit:  Engaged with patient by telephone for follow up visit.        Goals Addressed             This Visit's Progress    CCM (DIABETES) EXPECTED OUTCOME:  MONITOR, SELF-MANAGE AND REDUCE SYMPTOMS OF DIABETES       Current Barriers:  Knowledge Deficits related to Diabetes management Chronic Disease Management support and education needs related to Diabetes and diet No Advanced Directives in place- pt requests information be mailed Patient reports he checks CBG BID and since being on Farxiga blood sugars have improved with fasting ranges now 105-159 with occasional reading around 170 (depending on what pt ate), recently has not been checking randomly, pt has been working with [facility] pharmacist 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 Patient tries to follow carbohydrate modified diet, does not drink sugary drinks  Planned Interventions: Reviewed medications with patient and discussed importance of medication adherence;        Reviewed prescribed diet with patient carbohydrate modified; Counseled on importance of regular laboratory monitoring as prescribed;        Reviewed scheduled/upcoming provider appointments including:  primary care provider 07/21/22;         Advised patient, providing education and rationale, to check cbg twice daily and record        call provider for findings outside established parameters;       Advised patient to discuss any issues with blood sugar, medications with provider;      Reviewed importance of exercise and getting outside and  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  Symptom Management: Take medications as prescribed   Attend all scheduled provider appointments Call pharmacy for medication refills 3-7 days in advance of running out of medications Attend church or other social activities Perform all self care activities independently  Perform IADL's (shopping, preparing meals, housekeeping, managing finances) independently Call provider office for new concerns or questions  check blood sugar at prescribed times: twice daily check feet daily for cuts, sores or redness enter blood sugar readings and medication or insulin into daily log take the blood sugar log to all doctor visits take the blood sugar meter to all doctor visits trim toenails straight across fill half of plate with vegetables limit fast food meals to no more than 1 per week manage portion size prepare main meal at home 3 to 5 days each week read food labels for fat, fiber, carbohydrates and portion size keep feet up while sitting wash and dry feet carefully every day 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 Walking is good exercise  Follow Up Plan: Telephone follow up appointment with care management team member scheduled for:  08/09/22 at 945 am         CCM (HYPERTENSION) EXPECTED OUTCOME: MONITOR, SELF-MANAGE AND REDUCE SYMPTOMS OF HYPERTENSION       Current Barriers:  Knowledge Deficits related to Hypertension management Chronic Disease Management support and education needs related to Hypertension Patient reports he lives with spouse, is independent with all aspects of his care,  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  Patient states he checks blood pressure on occasion and readings are within normal limits  Planned Interventions: Reviewed medications with patient and discussed importance of compliance;  Counseled on the importance of exercise goals with target of 150 minutes per week Discussed plans with patient for ongoing care management follow  up and provided patient with direct contact information for care management team; Advised patient, providing education and rationale, to monitor blood pressure daily and record, calling PCP for findings outside established parameters;  Discussed complications of poorly controlled blood pressure such as heart disease, stroke, circulatory complications, vision complications, kidney impairment, sexual dysfunction;  Reinforced low sodium diet  Symptom Management: Take medications as prescribed   Attend all scheduled provider appointments Call pharmacy for medication refills 3-7 days in advance of running out of medications Attend church or other social activities Perform all self care activities independently  Perform IADL's (shopping, preparing meals, housekeeping, managing finances) independently Call provider office for new concerns or questions  check blood pressure weekly choose a place to take my blood pressure (home, clinic or office, retail store) write blood pressure results in a log or diary learn about high blood pressure keep a blood pressure log take blood pressure log to all doctor appointments call doctor for signs and symptoms of high blood pressure keep all doctor appointments take medications for blood pressure exactly as prescribed eat more whole grains, fruits and vegetables, lean meats and healthy fats Follow low sodium diet- read food labels  Follow Up Plan: Telephone follow up appointment with care management team member scheduled for:   08/09/22 at 945 am          Plan:Telephone follow up appointment with care management team member scheduled for:  08/09/22 at [place] am  Jacqlyn Larsen [facility], BSN RN Case Manager [place] [phone]

## 2022-06-01 DIAGNOSIS — I10 Essential (primary) hypertension: Secondary | ICD-10-CM

## 2022-06-01 DIAGNOSIS — E1159 Type 2 diabetes mellitus with other circulatory complications: Secondary | ICD-10-CM

## 2022-06-26 ENCOUNTER — Telehealth: Payer: Self-pay | Admitting: Family Medicine

## 2022-06-26 NOTE — Telephone Encounter (Signed)
Contacted Anise Salvo Ulrich to schedule their annual wellness visit. Appointment made for 04/26/2023.  Thank you,  Colletta [place],  [place] [facility] Direct Dial ??[id number]

## 2022-07-21 ENCOUNTER — Encounter: Payer: Self-pay | Admitting: Family Medicine

## 2022-07-21 ENCOUNTER — Ambulatory Visit (INDEPENDENT_AMBULATORY_CARE_PROVIDER_SITE_OTHER): Payer: Medicare Other | Admitting: Family Medicine

## 2022-07-21 VITALS — BP 134/80 | HR 57 | Ht 67.0 in | Wt 180.0 lb

## 2022-07-21 DIAGNOSIS — E1159 Type 2 diabetes mellitus with other circulatory complications: Secondary | ICD-10-CM | POA: Diagnosis not present

## 2022-07-21 DIAGNOSIS — N401 Enlarged prostate with lower urinary tract symptoms: Secondary | ICD-10-CM

## 2022-07-21 DIAGNOSIS — E1169 Type 2 diabetes mellitus with other specified complication: Secondary | ICD-10-CM | POA: Diagnosis not present

## 2022-07-21 DIAGNOSIS — R3912 Poor urinary stream: Secondary | ICD-10-CM

## 2022-07-21 DIAGNOSIS — I152 Hypertension secondary to endocrine disorders: Secondary | ICD-10-CM | POA: Diagnosis not present

## 2022-07-21 DIAGNOSIS — E785 Hyperlipidemia, unspecified: Secondary | ICD-10-CM

## 2022-07-21 LAB — BAYER DCA HB A1C WAIVED: HB A1C (BAYER DCA - WAIVED): 8.3 % — ABNORMAL HIGH (ref 4.8–5.6)

## 2022-07-21 MED ORDER — IRBESARTAN 300 MG PO TABS: 300.0000 mg | ORAL_TABLET | Freq: Every day | ORAL | 3 refills | Status: DC

## 2022-07-21 MED ORDER — FREESTYLE LIBRE 3 READER DEVI
1.0000 | 3 refills | Status: DC
Start: 2022-07-21 — End: 2022-07-24

## 2022-07-21 MED ORDER — FREESTYLE LIBRE 3 SENSOR MISC
1.0000 | 3 refills | Status: DC
Start: 2022-07-21 — End: 2022-07-24

## 2022-07-21 MED ORDER — PRAVASTATIN SODIUM 20 MG PO TABS: 20.0000 mg | ORAL_TABLET | Freq: Every day | ORAL | 3 refills | Status: DC

## 2022-07-21 MED ORDER — TAMSULOSIN HCL 0.4 MG PO CAPS: 0.4000 mg | ORAL_CAPSULE | Freq: Every day | ORAL | 3 refills | Status: DC

## 2022-07-21 NOTE — Progress Notes (Signed)
BP 134/80   Pulse (!) 57   Ht  (1.702 m)   Wt 180 lb (81.6 kg)   SpO2 95%   BMI 28.19 kg/m    Subjective:   Patient ID: Jeffrey Rubio, male    DOB: 09-10-1945, 77 y.o.   MRN: [id number]  HPI: Jeffrey Rubio is a 77 y.o. male presenting on 07/21/2022 for Medical Management of Chronic Issues, Diabetes, Hyperlipidemia, and Hypertension   HPI Type 2 diabetes mellitus Patient comes in today for recheck of his diabetes. Patient has been currently taking [place]. Patient is currently on an ACE inhibitor/ARB. Patient has not seen an ophthalmologist this year. Patient denies any new issues with their feet. The symptom started onset as an adult hypertension and hyperlipidemia ARE RELATED TO DM   Hypertension Patient is currently on amlodipine and irbesartan, and their blood pressure today is 134/80. Patient denies any lightheadedness or dizziness. Patient denies headaches, blurred vision, chest pains, shortness of breath, or weakness. Denies any side effects from medication and is content with current medication.   Hyperlipidemia Patient is coming in for recheck of his hyperlipidemia. The patient is currently taking pravastatin. They deny any issues with myalgias or history of liver damage from it. They deny any focal numbness or weakness or chest pain.   Relevant past medical, surgical, family and social history reviewed and updated as indicated. Interim medical history since our last visit reviewed. Allergies and medications reviewed and updated.  Review of Systems  Constitutional:  Negative for chills and fever.  Eyes:  Negative for visual disturbance.  Respiratory:  Negative for shortness of breath and wheezing.   Cardiovascular:  Negative for chest pain and leg swelling.  Musculoskeletal:  Negative for back pain and gait problem.  Skin:  Negative for rash.  All other systems reviewed and are negative.   Per HPI unless specifically indicated  above   Allergies as of 07/21/2022       Reactions   Hydrocodone-acetaminophen    Causes Anxiety    Ozempic (0.25 Or 0.5 Mg-dose) [semaglutide(0.25 Or 0.5mg -dos)] Other (See Comments)   Pancreatic insufficiency, GI--diarrhea        Medication List        Accurate as of July 21, 2022 10:47 AM. If you have any questions, ask your nurse or doctor.          Accu-Chek Aviva Plus test strip Generic drug: glucose blood TEST BLOOD SUGAR 3 times DAILY DX E11.69   Accu-Chek Aviva Plus w/Device Kit USE to check blood sugars daily   amLODipine 5 MG tablet Commonly known as: NORVASC Take 1 tablet (5 mg total) by mouth daily.   aspirin 81 MG tablet Take 81 mg by mouth daily.   b complex vitamins capsule Take 1 capsule by mouth daily.   dapagliflozin propanediol 10 MG Tabs tablet Commonly known as: Farxiga Take 1 tablet (10 mg total) by mouth daily before breakfast.   famotidine 20 MG tablet Commonly known as: PEPCID Take 1 tablet (20 mg total) by mouth 2 (two) times daily.   Fish Oil 1000 MG Caps Take 1,000 mg by mouth in the morning and at bedtime.   FreeStyle Libre 3 Reader Devi 1 each by Does not apply route every 14 (fourteen) days. Started by: Nils Pyle, MD   FreeStyle Libre 3 Sensor Misc 1 each by Does not apply route every 14 (fourteen) days. Place 1 sensor on the skin every 14 days. Use to  check glucose continuously Started by: Elige Radon Issaih Kaus, MD   irbesartan 300 MG tablet Commonly known as: AVAPRO Take 1 tablet (300 mg total) by mouth daily.   meclizine 25 MG tablet Commonly known as: ANTIVERT Take 1 tablet (25 mg total) by mouth 3 (three) times daily as needed for dizziness.   Metamucil 28.3 % Powd Generic drug: Psyllium Take 3 Scoops by mouth daily.   pravastatin 20 MG tablet Commonly known as: PRAVACHOL Take 1 tablet (20 mg total) by mouth daily.   PROBIOTIC-10 PO Take 1 capsule by mouth daily.   tamsulosin 0.4 MG Caps  capsule Commonly known as: FLOMAX Take 1 capsule (0.4 mg total) by mouth daily.   Evaristo Bury FlexTouch 100 UNIT/ML FlexTouch Pen Generic drug: insulin degludec Inject 10-20 Units into the skin at bedtime.   TRUEplus Lancets 28G Misc Use to check blood sugar once daily and PRN Dx. E11.9   Vitamin D3 50 MCG (2000 UT) Tabs Take 4,000 Units by mouth every morning.         Objective:   BP 134/80   Pulse (!) 57   Ht  (1.702 m)   Wt 180 lb (81.6 kg)   SpO2 95%   BMI 28.19 kg/m   Wt Readings from Last 3 Encounters:  07/21/22 180 lb (81.6 kg)  04/24/22 175 lb (79.4 kg)  01/16/22 179 lb (81.2 kg)    Physical Exam Vitals and nursing note reviewed.  Constitutional:      General: He is not in acute distress.    Appearance: He is well-developed. He is not diaphoretic.  Eyes:     General: No scleral icterus.    Conjunctiva/sclera: Conjunctivae normal.  Neck:     Thyroid: No thyromegaly.  Cardiovascular:     Rate and Rhythm: Normal rate and regular rhythm.     Heart sounds: Normal heart sounds. No murmur heard. Pulmonary:     Effort: Pulmonary effort is normal. No respiratory distress.     Breath sounds: Normal breath sounds. No wheezing.  Musculoskeletal:        General: No swelling. Normal range of motion.     Cervical back: Neck supple.  Lymphadenopathy:     Cervical: No cervical adenopathy.  Skin:    General: Skin is warm and dry.     Findings: No rash.  Neurological:     Mental Status: He is alert and oriented to person, place, and time.     Coordination: Coordination normal.  Psychiatric:        Behavior: Behavior normal.       Assessment & Plan:   Problem List Items Addressed This Visit       Cardiovascular and Mediastinum   Hypertension associated with diabetes   Relevant Medications   irbesartan (AVAPRO) 300 MG tablet   pravastatin (PRAVACHOL) 20 MG tablet     Endocrine   Hyperlipidemia associated with type 2 diabetes mellitus   Relevant  Medications   irbesartan (AVAPRO) 300 MG tablet   pravastatin (PRAVACHOL) 20 MG tablet   DM (diabetes mellitus) - Primary   Relevant Medications   irbesartan (AVAPRO) 300 MG tablet   pravastatin (PRAVACHOL) 20 MG tablet   Continuous Glucose Sensor (FREESTYLE LIBRE 3 SENSOR) MISC   Continuous Glucose Receiver (FREESTYLE LIBRE 3 READER) DEVI   Other Relevant Orders   Bayer DCA Hb A1c Waived     Genitourinary   Benign prostatic hyperplasia   Relevant Medications   tamsulosin (FLOMAX) 0.4 MG CAPS capsule  This office visit was a visit to discuss patient's diabetic management and because she is out of control and using insulin daily and having to check her blood sugars 4 times daily I believe she would be a good candidate for a continuous subcutaneous glucose monitor such as freestyle libre.   Patient says he is having good morning blood sugars but does not check his through the day because he has 6 finger.  Sounds like his diet is not as compliant, will send freestyle libre to see if we can get a little more data numbers to help him get on top of this.  No change in medication for now. Follow up plan: Return in about 3 months (around 10/20/2022), or if symptoms worsen or fail to improve, for hypertension and diabetes recheck.  Counseling provided for all of the vaccine components Orders Placed This Encounter  Procedures   Bayer DCA Hb A1c Waived    Arville Care, MD [facility] 07/21/2022, 10:47 AM

## 2022-07-22 ENCOUNTER — Encounter: Payer: Self-pay | Admitting: Family Medicine

## 2022-07-22 DIAGNOSIS — E1169 Type 2 diabetes mellitus with other specified complication: Secondary | ICD-10-CM

## 2022-07-24 MED ORDER — DEXCOM G7 RECEIVER DEVI
1.0000 | Freq: Four times a day (QID) | 1 refills | Status: DC
Start: 2022-07-24 — End: 2022-11-17

## 2022-07-24 MED ORDER — DEXCOM G7 SENSOR MISC
1.0000 | 3 refills | Status: DC
Start: 2022-07-24 — End: 2022-11-17

## 2022-07-24 NOTE — Telephone Encounter (Signed)
Dr. Louanne Skye,  Do you want to refer to Jeffrey Rubio?  Rx something else?

## 2022-07-25 ENCOUNTER — Telehealth: Payer: Self-pay

## 2022-07-25 NOTE — Telephone Encounter (Signed)
Received call from [facility] in [place] with info on initiating PA.  Faxed pts part B plan OV notes and labs for [facility] 3 coverage  Fax [phone]

## 2022-08-01 DIAGNOSIS — E113293 Type 2 diabetes mellitus with mild nonproliferative diabetic retinopathy without macular edema, bilateral: Secondary | ICD-10-CM | POA: Diagnosis not present

## 2022-08-01 LAB — HM DIABETES EYE EXAM

## 2022-08-09 ENCOUNTER — Ambulatory Visit (INDEPENDENT_AMBULATORY_CARE_PROVIDER_SITE_OTHER): Payer: Medicare Other | Admitting: *Deleted

## 2022-08-09 DIAGNOSIS — I152 Hypertension secondary to endocrine disorders: Secondary | ICD-10-CM

## 2022-08-09 DIAGNOSIS — E1169 Type 2 diabetes mellitus with other specified complication: Secondary | ICD-10-CM

## 2022-08-09 NOTE — Chronic Care Management (AMB) (Signed)
Chronic Care Management   CCM RN Visit Note  08/09/2022 Name: Jeffrey Rubio MRN: [id number] DOB: [birth year]  Subjective: Jeffrey Rubio is a 77 y.o. year old male who is a primary care patient of Dettinger, Elige Radon, MD. The patient was referred to the Chronic Care Management team for assistance with care management needs subsequent to provider initiation of CCM services and plan of care.    Today's Visit:  Engaged with patient by telephone for follow up visit.        Goals Addressed             This Visit's Progress    CCM (DIABETES) EXPECTED OUTCOME:  MONITOR, SELF-MANAGE AND REDUCE SYMPTOMS OF DIABETES       Current Barriers:  Knowledge Deficits related to Diabetes management Chronic Disease Management support and education needs related to Diabetes and diet No Advanced Directives in place- pt requests information be mailed Patient reports he checks CBG BID and since being on Farxiga blood sugars have improved with fasting ranges in low 100's with today's reading 111, recently has not been checking randomly, pt has been working with [facility] pharmacist 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 Patient tries to follow carbohydrate modified diet, does not drink sugary drinks  Planned Interventions: Reviewed medications with patient and discussed importance of medication adherence;        Counseled on importance of regular laboratory monitoring as prescribed;        Reviewed scheduled/upcoming provider appointments including:  primary care provider;         Advised patient, providing education and rationale, to check cbg twice daily and record        call provider for findings outside established parameters;       Advised patient to discuss any issues with blood sugar, medications with provider;      Reviewed importance of exercise and getting outside and 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 Reinforced carbohydrate modified diet  Symptom Management: Take medications as  prescribed   Attend all scheduled provider appointments Call pharmacy for medication refills 3-7 days in advance of running out of medications Attend church or other social activities Perform all self care activities independently  Perform IADL's (shopping, preparing meals, housekeeping, managing finances) independently Call provider office for new concerns or questions  check blood sugar at prescribed times: twice daily check feet daily for cuts, sores or redness enter blood sugar readings and medication or insulin into daily log take the blood sugar log to all doctor visits take the blood sugar meter to all doctor visits trim toenails straight across eat fish at least once per week fill half of plate with vegetables limit fast food meals to no more than 1 per week manage portion size prepare main meal at home 3 to 5 days each week read food labels for fat, fiber, carbohydrates and portion size set a realistic goal keep feet up while sitting wash and dry feet carefully every day 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 Patient states he checks blood pressure on occasion and readings are within normal limits  Planned Interventions: Reviewed medications with patient and discussed importance of compliance;  Counseled on the importance of exercise goals with target of 150 minutes per week Discussed plans with patient for ongoing care  management follow up and provided patient with direct contact information for care management team; Advised patient, providing education and rationale, to monitor blood pressure daily and record, calling PCP for findings outside established parameters;  Discussed complications of poorly controlled blood pressure such as heart disease, stroke, circulatory complications, vision complications, kidney impairment, sexual dysfunction;  Reviewed low sodium diet and importance of reading food labels  Symptom Management: Take medications as prescribed   Attend all scheduled provider appointments Call pharmacy for medication refills 3-7 days in advance of running out of medications Attend church or other social activities Perform all self care activities independently  Perform IADL's (shopping, preparing meals, housekeeping, managing finances) independently Call provider office for new concerns or questions  check blood pressure weekly choose a place to take my blood pressure (home, clinic or office, retail store) write blood pressure results in a log or diary learn about high blood pressure keep a blood pressure log take blood pressure log to all doctor appointments call doctor for signs and symptoms of high blood pressure keep all doctor appointments take medications for blood pressure exactly as prescribed eat more whole grains, fruits and vegetables, lean meats and healthy fats Follow low sodium diet- read food labels  Follow Up Plan: Telephone follow up appointment with care management team member scheduled for:   11/14/22 at 9 am          Plan:Telephone follow up appointment with care management team member scheduled for:  11/14/22 at 9 am  Irving Shows [facility], BSN RN Case Manager [facility] [phone]

## 2022-08-09 NOTE — Patient Instructions (Signed)
Please call the care guide team at [phone] if you need to cancel or reschedule your appointment.   If you are experiencing a Mental Health or Behavioral Health Crisis or need someone to talk to, please call the Suicide and Crisis Lifeline: 988 call the [place] National Suicide Prevention Lifeline: (218)093-2226 or TTY: (316)466-5058 TTY 463-199-1467) to talk to a trained counselor call 1-800-273-TALK (toll free, 24 hour hotline) go to [facility] [address] [place] [phone]) call the [facility]: (832)618-0524 call 911   Following is a copy of the CCM Program Consent:  CCM service includes personalized support from designated clinical staff supervised by the physician, including individualized plan of care and coordination with other care providers 24/7 contact phone numbers for assistance for urgent and routine care needs. Service will only be billed when office clinical staff spend 20 minutes or more in a month to coordinate care. Only one practitioner may furnish and bill the service in a calendar month. The patient may stop CCM services at amy time (effective at the end of the month) by phone call to the office staff. The patient will be responsible for cost sharing (co-pay) or up to 20% of the service fee (after annual deductible is met)  Following is a copy of your full provider care plan:   Goals Addressed             This Visit's Progress    CCM (DIABETES) EXPECTED OUTCOME:  MONITOR, SELF-MANAGE AND REDUCE SYMPTOMS OF DIABETES       Current Barriers:  Knowledge Deficits related to Diabetes management Chronic Disease Management support and education needs related to Diabetes and diet No Advanced Directives in place- pt requests information be mailed Patient reports he checks CBG BID and since being on Farxiga blood sugars have improved with fasting ranges in low 100's with today's reading 111, recently has not  been checking randomly, pt has been working with [facility] pharmacist 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 Patient tries to follow carbohydrate modified diet, does not drink sugary drinks  Planned Interventions: Reviewed medications with patient and discussed importance of medication adherence;        Counseled on importance of regular laboratory monitoring as prescribed;        Reviewed scheduled/upcoming provider appointments including:  primary care provider;         Advised patient, providing education and rationale, to check cbg twice daily and record        call provider for findings outside established parameters;       Advised patient to discuss any issues with blood sugar, medications with provider;      Reviewed importance of exercise and getting outside and 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 Reinforced carbohydrate modified diet  Symptom Management: Take medications as prescribed   Attend all scheduled provider appointments Call pharmacy for medication refills 3-7 days in advance of running out of medications Attend church or other social activities Perform all self care activities independently  Perform IADL's (shopping, preparing meals, housekeeping, managing finances) independently Call provider office for new concerns or questions  check blood sugar at prescribed times: twice daily check feet daily for cuts, sores or redness enter blood sugar readings and medication or insulin into daily log take the blood sugar log to all doctor visits take the blood sugar meter to all doctor visits trim toenails straight across eat fish at least once per week fill half of plate  with vegetables limit fast food meals to no more than 1 per week manage portion size prepare main meal at home 3 to 5 days each week read food labels for fat, fiber, carbohydrates and portion size set a realistic goal keep feet up while sitting wash and dry feet carefully every day 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 Patient states he checks blood pressure on occasion and readings are within normal limits  Planned Interventions: Reviewed medications with patient and discussed importance of compliance;  Counseled on the importance of exercise goals with target of 150 minutes per week Discussed plans with patient for ongoing care management follow up and provided patient with direct contact information for care management team; Advised patient, providing education and rationale, to monitor blood pressure daily and record, calling PCP for findings outside established parameters;  Discussed complications of poorly controlled blood pressure such as heart disease, stroke, circulatory complications, vision complications, kidney impairment, sexual dysfunction;  Reviewed low sodium diet and importance of reading food labels  Symptom Management: Take medications as prescribed   Attend all scheduled provider appointments Call pharmacy for medication refills 3-7 days in advance of running out of medications Attend church or other social activities Perform all self care activities independently  Perform IADL's (shopping, preparing meals, housekeeping, managing finances) independently Call provider office for new concerns or questions  check blood pressure weekly choose a place to take my blood pressure (home, clinic or office, retail  store) write blood pressure results in a log or diary learn about high blood pressure keep a blood pressure log take blood pressure log to all doctor appointments call doctor for signs and symptoms of high blood pressure keep all doctor appointments take medications for blood pressure exactly as prescribed eat more whole grains, fruits and vegetables, lean meats and healthy fats Follow low sodium diet- read food labels  Follow Up Plan: Telephone follow up appointment with care management team member scheduled for:   11/14/22 at 9 am          Patient verbalizes understanding of instructions and care plan provided today and agrees to view in MyChart. Active MyChart status and patient understanding of how to access instructions and care plan via MyChart confirmed with patient.  Telephone follow up appointment with care management team member scheduled for:  11/14/22 at 9 am

## 2022-08-17 DIAGNOSIS — M47816 Spondylosis without myelopathy or radiculopathy, lumbar region: Secondary | ICD-10-CM | POA: Diagnosis not present

## 2022-08-17 DIAGNOSIS — M5459 Other low back pain: Secondary | ICD-10-CM | POA: Diagnosis not present

## 2022-08-17 DIAGNOSIS — M5416 Radiculopathy, lumbar region: Secondary | ICD-10-CM | POA: Diagnosis not present

## 2022-08-21 ENCOUNTER — Encounter: Payer: Self-pay | Admitting: Family Medicine

## 2022-08-24 DIAGNOSIS — M5116 Intervertebral disc disorders with radiculopathy, lumbar region: Secondary | ICD-10-CM | POA: Diagnosis not present

## 2022-08-24 DIAGNOSIS — M48061 Spinal stenosis, lumbar region without neurogenic claudication: Secondary | ICD-10-CM | POA: Diagnosis not present

## 2022-08-24 DIAGNOSIS — M4727 Other spondylosis with radiculopathy, lumbosacral region: Secondary | ICD-10-CM | POA: Diagnosis not present

## 2022-08-24 DIAGNOSIS — M5416 Radiculopathy, lumbar region: Secondary | ICD-10-CM | POA: Diagnosis not present

## 2022-08-24 DIAGNOSIS — M47816 Spondylosis without myelopathy or radiculopathy, lumbar region: Secondary | ICD-10-CM | POA: Diagnosis not present

## 2022-08-24 DIAGNOSIS — M4726 Other spondylosis with radiculopathy, lumbar region: Secondary | ICD-10-CM | POA: Diagnosis not present

## 2022-09-01 DIAGNOSIS — Z7984 Long term (current) use of oral hypoglycemic drugs: Secondary | ICD-10-CM

## 2022-09-01 DIAGNOSIS — E1159 Type 2 diabetes mellitus with other circulatory complications: Secondary | ICD-10-CM

## 2022-09-01 DIAGNOSIS — I10 Essential (primary) hypertension: Secondary | ICD-10-CM

## 2022-09-25 DIAGNOSIS — M47816 Spondylosis without myelopathy or radiculopathy, lumbar region: Secondary | ICD-10-CM | POA: Diagnosis not present

## 2022-09-25 DIAGNOSIS — M5416 Radiculopathy, lumbar region: Secondary | ICD-10-CM | POA: Diagnosis not present

## 2022-10-27 ENCOUNTER — Other Ambulatory Visit: Payer: Self-pay

## 2022-10-27 ENCOUNTER — Other Ambulatory Visit: Payer: Medicare Other

## 2022-10-27 ENCOUNTER — Encounter: Payer: Self-pay | Admitting: Family Medicine

## 2022-10-27 ENCOUNTER — Telehealth: Payer: Medicare Other | Admitting: Family Medicine

## 2022-10-27 DIAGNOSIS — E1159 Type 2 diabetes mellitus with other circulatory complications: Secondary | ICD-10-CM | POA: Diagnosis not present

## 2022-10-27 DIAGNOSIS — E785 Hyperlipidemia, unspecified: Secondary | ICD-10-CM | POA: Diagnosis not present

## 2022-10-27 DIAGNOSIS — E1169 Type 2 diabetes mellitus with other specified complication: Secondary | ICD-10-CM

## 2022-10-27 DIAGNOSIS — I152 Hypertension secondary to endocrine disorders: Secondary | ICD-10-CM

## 2022-10-27 DIAGNOSIS — Z7984 Long term (current) use of oral hypoglycemic drugs: Secondary | ICD-10-CM | POA: Diagnosis not present

## 2022-10-27 LAB — CMP14+EGFR
ALT: 18 IU/L (ref 0–44)
AST: 15 IU/L (ref 0–40)
Albumin: 4.3 g/dL (ref 3.8–4.8)
Alkaline Phosphatase: 89 IU/L (ref 44–121)
BUN/Creatinine Ratio: 21 (ref 10–24)
BUN: 32 mg/dL — ABNORMAL HIGH (ref 8–27)
Bilirubin Total: 0.3 mg/dL (ref 0.0–1.2)
CO2: 20 mmol/L (ref 20–29)
Calcium: 9.4 mg/dL (ref 8.6–10.2)
Chloride: 108 mmol/L — ABNORMAL HIGH (ref 96–106)
Creatinine, Ser: 1.49 mg/dL — ABNORMAL HIGH (ref 0.76–1.27)
Globulin, Total: 2.5 g/dL (ref 1.5–4.5)
Glucose: 112 mg/dL — ABNORMAL HIGH (ref 70–99)
Potassium: 4.8 mmol/L (ref 3.5–5.2)
Sodium: 141 mmol/L (ref 134–144)
Total Protein: 6.8 g/dL (ref 6.0–8.5)
eGFR: 48 mL/min/{1.73_m2} — ABNORMAL LOW (ref >59)

## 2022-10-27 LAB — CBC WITH DIFFERENTIAL/PLATELET
Basophils Absolute: 0 10*3/uL (ref 0.0–0.2)
Basos: 0 %
EOS (ABSOLUTE): 0 10*3/uL (ref 0.0–0.4)
Eos: 1 %
Hematocrit: 44 % (ref 37.5–51.0)
Hemoglobin: 14.4 g/dL (ref 13.0–17.7)
Immature Grans (Abs): 0 10*3/uL (ref 0.0–0.1)
Immature Granulocytes: 0 %
Lymphocytes Absolute: 2 10*3/uL (ref 0.7–3.1)
Lymphs: 32 %
MCH: 29 pg (ref 26.6–33.0)
MCHC: 32.7 g/dL (ref 31.5–35.7)
MCV: 89 fL (ref 79–97)
Monocytes Absolute: 0.6 10*3/uL (ref 0.1–0.9)
Monocytes: 9 %
Neutrophils Absolute: 3.8 10*3/uL (ref 1.4–7.0)
Neutrophils: 58 %
Platelets: 223 10*3/uL (ref 150–450)
RBC: 4.96 x10E6/uL (ref 4.14–5.80)
RDW: 13.2 % (ref 11.6–15.4)
WBC: 6.4 10*3/uL (ref 3.4–10.8)

## 2022-10-27 LAB — LIPID PANEL
Chol/HDL Ratio: 3.7 ratio (ref 0.0–5.0)
Cholesterol, Total: 174 mg/dL (ref 100–199)
HDL: 47 mg/dL (ref >39)
LDL Chol Calc (NIH): 100 mg/dL — ABNORMAL HIGH (ref 0–99)
Triglycerides: 153 mg/dL — ABNORMAL HIGH (ref 0–149)
VLDL Cholesterol Cal: 27 mg/dL (ref 5–40)

## 2022-10-27 LAB — BAYER DCA HB A1C WAIVED: HB A1C (BAYER DCA - WAIVED): 7.1 % — ABNORMAL HIGH (ref 4.8–5.6)

## 2022-10-27 NOTE — Addendum Note (Signed)
Addended by: Waynette Buttery on: 10/27/2022 09:49 AM   Modules accepted: Orders

## 2022-10-27 NOTE — Progress Notes (Signed)
Virtual Visit via MyChart video note  I connected with Jeffrey Rubio on 10/27/22 at 0925 by video and verified that I am speaking with the correct person using two identifiers. Jeffrey Rubio is currently located at car and patient and wife  are currently with her during visit. The provider, Elige Radon Guilherme Schwenke, MD is located in their office at time of visit.  Call ended at 0935  I discussed the limitations, risks, security and privacy concerns of performing an evaluation and management service by video and the availability of in person appointments. I also discussed with the patient that there may be a patient responsible charge related to this service. The patient expressed understanding and agreed to proceed.   History and Present Illness: Type 2 diabetes mellitus Patient comes in today for recheck of his diabetes. Patient has been currently taking tresiba 18 at bedtime and farxiga. Patient is currently on an ACE inhibitor/ARB. Patient has not seen an ophthalmologist this year. Patient denies any new issues with their feet. The symptom started onset as an adult htn and hld ARE RELATED TO DM   Hypertension Patient is currently on amlodipine and irbesartan, and their blood pressure today is 120/74. Patient denies any lightheadedness or dizziness. Patient denies headaches, blurred vision, chest pains, shortness of breath, or weakness. Denies any side effects from medication and is content with current medication.   Hyperlipidemia Patient is coming in for recheck of his hyperlipidemia. The patient is currently taking pravastatin and fish oils. They deny any issues with myalgias or history of liver damage from it. They deny any focal numbness or weakness or chest pain.   1. Type 2 diabetes mellitus with other specified complication, without long-term current use of insulin (HCC)   2. Hyperlipidemia associated with type 2 diabetes mellitus (HCC)   3. Hypertension associated with  diabetes [facility])     Outpatient Encounter Medications as of 10/27/2022  Medication Sig   amLODipine (NORVASC) 5 MG tablet Take 1 tablet (5 mg total) by mouth daily.   aspirin 81 MG tablet Take 81 mg by mouth daily.   b complex vitamins capsule Take 1 capsule by mouth daily.   Blood Glucose Monitoring Suppl (ACCU-CHEK AVIVA PLUS) w/Device KIT USE to check blood sugars daily   Cholecalciferol (VITAMIN D3) 2000 units TABS Take 4,000 Units by mouth every morning.   Continuous Glucose Receiver (DEXCOM G7 RECEIVER) DEVI 1 each by Does not apply route 4 (four) times daily. (Patient not taking: Reported on 08/09/2022)   Continuous Glucose Sensor (DEXCOM G7 SENSOR) MISC 1 each by Does not apply route every 14 (fourteen) days. (Patient not taking: Reported on 08/09/2022)   dapagliflozin propanediol (FARXIGA) 10 MG TABS tablet Take 1 tablet (10 mg total) by mouth daily before breakfast.   famotidine (PEPCID) 20 MG tablet Take 1 tablet (20 mg total) by mouth 2 (two) times daily.   glucose blood (ACCU-CHEK AVIVA PLUS) test strip TEST BLOOD SUGAR 3 times DAILY DX E11.69   insulin degludec (TRESIBA FLEXTOUCH) 100 UNIT/ML FlexTouch Pen Inject 10-20 Units into the skin at bedtime.   irbesartan (AVAPRO) 300 MG tablet Take 1 tablet (300 mg total) by mouth daily.   meclizine (ANTIVERT) 25 MG tablet Take 1 tablet (25 mg total) by mouth 3 (three) times daily as needed for dizziness.   Omega-3 Fatty Acids (FISH OIL) 1000 MG CAPS Take 1,000 mg by mouth in the morning and at bedtime.   pravastatin (PRAVACHOL) 20 MG tablet Take 1  tablet (20 mg total) by mouth daily.   Probiotic Product (PROBIOTIC-10 PO) Take 1 capsule by mouth daily.   Psyllium (METAMUCIL) 28.3 % POWD Take 3 Scoops by mouth daily.   tamsulosin (FLOMAX) 0.4 MG CAPS capsule Take 1 capsule (0.4 mg total) by mouth daily.   TRUEplus Lancets 28G MISC Use to check blood sugar once daily and PRN Dx. E11.9   No facility-administered encounter medications on file as  of 10/27/2022.    Review of Systems  Constitutional:  Negative for chills and fever.  Eyes:  Negative for visual disturbance.  Respiratory:  Negative for shortness of breath and wheezing.   Cardiovascular:  Negative for chest pain and leg swelling.  Musculoskeletal:  Negative for back pain and gait problem.  Skin:  Negative for rash.  Neurological:  Negative for dizziness, weakness and light-headedness.  All other systems reviewed and are negative.   Observations/Objective: Patient sounds comfortable and in no acute distress  Assessment and Plan: Problem List Items Addressed This Visit       Cardiovascular and Mediastinum   Hypertension associated with diabetes (HCC)   Relevant Orders   CMP14+EGFR     Endocrine   Hyperlipidemia associated with type 2 diabetes mellitus (HCC)   Relevant Orders   CMP14+EGFR   Lipid panel   DM (diabetes mellitus) (HCC) - Primary   Relevant Orders   Bayer DCA Hb A1c Waived   CBC with Differential/Platelet    Will do labs and A1c and will let him.  Follow up plan: Return in about 3 months (around 01/27/2023), or if symptoms worsen or fail to improve, for diabetes and hld.     I discussed the assessment and treatment plan with the patient. The patient was provided an opportunity to ask questions and all were answered. The patient agreed with the plan and demonstrated an understanding of the instructions.   The patient was advised to call back or seek an in-person evaluation if the symptoms worsen or if the condition fails to improve as anticipated.  The above assessment and management plan was discussed with the patient. The patient verbalized understanding of and has agreed to the management plan. Patient is aware to call the clinic if symptoms persist or worsen. Patient is aware when to return to the clinic for a follow-up visit. Patient educated on when it is appropriate to go to the emergency department.    I provided 10 minutes of  non-face-to-face time during this encounter.    Jeffrey Pyle, MD

## 2022-11-06 DIAGNOSIS — E113293 Type 2 diabetes mellitus with mild nonproliferative diabetic retinopathy without macular edema, bilateral: Secondary | ICD-10-CM | POA: Diagnosis not present

## 2022-11-07 ENCOUNTER — Telehealth: Payer: Self-pay | Admitting: Family Medicine

## 2022-11-13 ENCOUNTER — Other Ambulatory Visit (HOSPITAL_COMMUNITY): Payer: Self-pay

## 2022-11-14 ENCOUNTER — Other Ambulatory Visit: Payer: Medicare Other | Admitting: *Deleted

## 2022-11-14 ENCOUNTER — Telehealth: Payer: Medicare Other

## 2022-11-14 ENCOUNTER — Encounter: Payer: Self-pay | Admitting: *Deleted

## 2022-11-14 NOTE — Patient Outreach (Signed)
Care Management   Visit Note  11/14/2022 Name: Jeffrey Rubio MRN: [id number] DOB: 09-13-1945  Subjective: Jeffrey Rubio is a 77 y.o. year old male who is a primary care patient of Dettinger, Elige Radon, MD. The Care Management team was consulted for assistance.      Engaged with patient spoke with patient by telephone.    Goals Addressed             This Visit's Progress    COMPLETED: CCM (DIABETES) EXPECTED OUTCOME:  MONITOR, SELF-MANAGE AND REDUCE SYMPTOMS OF DIABETES       Current Barriers:  Knowledge Deficits related to Diabetes management Chronic Disease Management support and education needs related to Diabetes and diet No Advanced Directives in place- pt requests information be mailed Patient reports he checks CBG BID and since being on Farxiga blood sugars have improved with fasting ranges in low 100's with today's reading 124, recently has not been checking randomly, pt has been working with Jeffrey Rubio pharmacist 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 Patient tries to follow carbohydrate modified diet, does not drink sugary drinks Recent AIC 7.1 on 10/27/22, pt / spouse Jeffrey Rubio report patient used coupon for [place] for this month and unsure about affordability after that  Planned Interventions: Reviewed medications with patient and discussed importance of medication adherence;        Counseled on importance of regular laboratory monitoring as prescribed;        Reviewed scheduled/upcoming provider appointments including:  primary care provider;         Advised patient, providing education and rationale, to check cbg twice daily and record        call provider for findings outside established parameters;       Advised patient to discuss any issues with blood sugar, medications with provider;      Reviewed importance of exercise and getting outside and 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 Reinforced carbohydrate modified diet Reviewed plan of care with pt including  case closure for nurse, patient continues to work with pharmacist In basket message sent to pharmacist Jeffrey Rubio and Jeffrey Rubio reporting pt / spouse requests phone call for further updates about farxiga  Symptom Management: Take medications as prescribed   Attend all scheduled provider appointments Call pharmacy for medication refills 3-7 days in advance of running out of medications Attend church or other social activities Perform all self care activities independently  Perform IADL's (shopping, preparing meals, housekeeping, managing finances) independently Call provider office for new concerns or questions  check blood sugar at prescribed times: twice daily check feet daily for cuts, sores or redness enter blood sugar readings and medication or insulin into daily log take the blood sugar log to all doctor visits take the blood sugar meter to all doctor visits trim toenails straight across eat fish at least once per week fill half of plate with vegetables limit fast food meals to no more than 1 per week manage portion size prepare main meal at home 3 to 5 days each week read food labels for fat, fiber, carbohydrates and portion size set a realistic goal keep feet up while sitting wash and dry feet carefully every day 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 and walking Patient states he  checks blood pressure on occasion and readings are within normal limits No new concerns reported  Planned Interventions: Reviewed medications with patient and discussed importance of compliance;  Counseled on the importance of exercise goals with target of 150 minutes per week Discussed plans with patient for ongoing care management follow up and provided patient with direct contact information for care management team; Advised patient, providing education and rationale, to monitor blood pressure daily and record, calling PCP for findings outside established parameters;  Discussed complications of poorly controlled blood pressure such as heart disease, stroke, circulatory complications, vision complications, kidney impairment, sexual dysfunction;  Reinforced low sodium diet and importance of reading food labels Reviewed plan of care with patient including case closure for nurse  Symptom Management: Take medications as prescribed   Attend all scheduled provider appointments Call pharmacy for medication refills 3-7 days in advance of running out of medications Attend church or other social activities Perform all self care activities independently  Perform IADL's (shopping, preparing meals, housekeeping, managing finances) independently Call provider office for new concerns or questions  check blood pressure weekly choose a place to take my blood pressure (home, clinic or office, retail store) write blood pressure results in a log or diary learn about high blood pressure keep a blood pressure log take blood pressure log to all doctor appointments call doctor for signs and symptoms of high blood pressure keep all doctor appointments take medications for blood pressure exactly as prescribed eat more whole grains, fruits and vegetables, lean meats and healthy fats Follow low sodium diet- read food labels  Follow Up Plan: No further follow up required: case closure               Plan:  No further follow up required: case closure  Jeffrey Rubio Jeffrey Rubio, BSN [place]/ Ambulatory Care Management [phone]

## 2022-11-14 NOTE — Patient Instructions (Signed)
Visit Information  Thank you for taking time to visit with me today. Please don't hesitate to contact me if I can be of assistance to you before our next scheduled telephone appointment.  Following are the goals we discussed today:   Goals Addressed             This Visit's Progress    COMPLETED: CCM (DIABETES) EXPECTED OUTCOME:  MONITOR, SELF-MANAGE AND REDUCE SYMPTOMS OF DIABETES       Current Barriers:  Knowledge Deficits related to Diabetes management Chronic Disease Management support and education needs related to Diabetes and diet No Advanced Directives in place- pt requests information be mailed Patient reports he checks CBG BID and since being on Farxiga blood sugars have improved with fasting ranges in low 100's with today's reading 124, recently has not been checking randomly, pt has been working with [name] pharmacist 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 Patient tries to follow carbohydrate modified diet, does not drink sugary drinks Recent AIC 7.1 on 10/27/22, pt / spouse Jeffrey Rubio report patient used coupon for [place] for this month and unsure about affordability after that  Planned Interventions: Reviewed medications with patient and discussed importance of medication adherence;        Counseled on importance of regular laboratory monitoring as prescribed;        Reviewed scheduled/upcoming provider appointments including:  primary care provider;         Advised patient, providing education and rationale, to check cbg twice daily and record        call provider for findings outside established parameters;       Advised patient to discuss any issues with blood sugar, medications with provider;      Reviewed importance of exercise and getting outside and 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 Reinforced carbohydrate modified diet Reviewed plan of care with pt including case closure for nurse, patient continues to work with pharmacist In basket message sent to pharmacist  Jeffrey Rubio and Jeffrey Rubio reporting pt / spouse requests phone call for further updates about farxiga  Symptom Management: Take medications as prescribed   Attend all scheduled provider appointments Call pharmacy for medication refills 3-7 days in advance of running out of medications Attend church or other social activities Perform all self care activities independently  Perform IADL's (shopping, preparing meals, housekeeping, managing finances) independently Call provider office for new concerns or questions  check blood sugar at prescribed times: twice daily check feet daily for cuts, sores or redness enter blood sugar readings and medication or insulin into daily log take the blood sugar log to all doctor visits take the blood sugar meter to all doctor visits trim toenails straight across eat fish at least once per week fill half of plate with vegetables limit fast food meals to no more than 1 per week manage portion size prepare main meal at home 3 to 5 days each week read food labels for fat, fiber, carbohydrates and portion size set a realistic goal keep feet up while sitting wash and dry feet carefully every day 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 Patient states he checks blood pressure on occasion and readings are within normal limits No new concerns reported  Planned  Interventions: Reviewed medications with patient and discussed importance of compliance;  Counseled on the importance of exercise goals with target of 150 minutes per week Discussed plans with patient for ongoing care management follow up and provided patient with direct contact information for care management team; Advised patient, providing education and rationale, to monitor blood pressure daily and record, calling PCP for findings outside established parameters;  Discussed complications of poorly controlled blood pressure such as heart disease, stroke, circulatory complications, vision complications, kidney impairment, sexual dysfunction;  Reinforced low sodium diet and importance of reading food labels Reviewed plan of care with patient including case closure for nurse  Symptom Management: Take medications as prescribed   Attend all scheduled provider appointments Call pharmacy for medication refills 3-7 days in advance of running out of medications Attend church or other social activities Perform all self care activities independently  Perform IADL's (shopping, preparing meals, housekeeping, managing finances) independently Call provider office for new concerns or questions  check blood pressure weekly choose a place to take my blood pressure (home, clinic or office, retail store) write blood pressure results in a log or diary learn about high blood pressure keep a blood pressure log take blood pressure log to all doctor appointments call doctor for signs and symptoms of high blood pressure keep all doctor appointments take medications for blood pressure exactly as prescribed eat more whole grains, fruits and vegetables, lean meats and healthy fats Follow low sodium diet- read food labels  Follow Up Plan: No further follow up required: case closure               Please call the care guide team at [phone] if you need to cancel or reschedule your appointment.   If you  are experiencing a Mental Health or Behavioral Health Crisis or need someone to talk to, please call the Suicide and Crisis Lifeline: 988 call the [place] National Suicide Prevention Lifeline: (405)237-8343 or TTY: 769-567-9321 TTY 475-703-6895) to talk to a trained counselor call 1-800-273-TALK (toll free, 24 hour hotline) go to [facility] [address], [place] [phone]) call the [facility] Crisis Line: [phone] call 911   Patient verbalizes understanding of instructions and care plan provided today and agrees to view in MyChart. Active MyChart status and patient understanding of how to access instructions and care plan via MyChart confirmed with patient.     No further follow up required: case closure  Jeffrey Rubio [facility], BSN [place]/ Ambulatory Care Management [phone]

## 2022-11-17 ENCOUNTER — Telehealth: Payer: Self-pay | Admitting: Pharmacist

## 2022-11-17 ENCOUNTER — Ambulatory Visit (INDEPENDENT_AMBULATORY_CARE_PROVIDER_SITE_OTHER): Payer: Medicare Other | Admitting: Pharmacist

## 2022-11-17 DIAGNOSIS — Z794 Long term (current) use of insulin: Secondary | ICD-10-CM

## 2022-11-17 DIAGNOSIS — E1169 Type 2 diabetes mellitus with other specified complication: Secondary | ICD-10-CM

## 2022-11-17 MED ORDER — FREESTYLE LIBRE 3 PLUS SENSOR MISC
1.0000 | Status: DC
Start: 2022-11-17 — End: 2024-01-01

## 2022-11-17 MED ORDER — DAPAGLIFLOZIN PROPANEDIOL 10 MG PO TABS
10.0000 mg | ORAL_TABLET | Freq: Every day | ORAL | 3 refills | Status: AC
Start: 2022-11-17 — End: ?

## 2022-11-17 NOTE — Progress Notes (Signed)
11/17/2022 Name: Jeffrey Rubio MRN: [id number] DOB: Aug 06, 1945  Chief Complaint  Patient presents with   Diabetes    Jeffrey Rubio is a 77 y.o. year old male who presented for a telephone visit.  I connected with  Jeffrey Rubio on 11/17/22 by telephone and verified that I am speaking with the correct person using two identifiers.  I discussed the limitations of evaluation and management by telemedicine. The patient expressed understanding and agreed to proceed.  Patient was located in her home and PharmD in PCP office during this visit.    They were referred to the pharmacist by their PCP for assistance in managing diabetes and medication access.  Patient has likely entered the coverage gap with medicare and is now being charged >$100/month for [place].  He is currently stable on regimen and we would like to continue his current therapy.   Subjective:  Care Team: Primary Care Provider: Dettinger, Elige Radon, MD   Medication Access/Adherence  Patient reports affordability concerns with their medications: Yes  Patient reports access/transportation concerns to their pharmacy: No  Patient reports adherence concerns with their medications:  no   Diabetes:  Current medications: Tresiba 18 units, Farxiga 10mg  daily Medications tried in the past: Ozempic, glimepiride, metformin, sitagliptin, pioglitazone Ozempic d/c'd by GI due to pancreatic insufficiency  Current glucose readings: FBG< 113 Accucheck GUIDE  -Patient is testing blood sugar 4-6 times daily -Patient is injecting insulin daily -He would greatly benefit from a continuous glucose monitoring system (I.e. libre or dexcom)  Patient denies hypoglycemic s/sx including dizziness, shakiness, sweating. Patient denies hyperglycemic symptoms including polyuria, polydipsia, polyphagia, nocturia, neuropathy, blurred vision.  Current physical activity: active outdoors/gardening   Current medication access support:  n/a   Objective:  Lab Results  Component Value Date   HGBA1C 7.1 (H) 10/27/2022    Lab Results  Component Value Date   CREATININE 1.49 (H) 10/27/2022   BUN 32 (H) 10/27/2022   NA 141 10/27/2022   K 4.8 10/27/2022   CL 108 (H) 10/27/2022   CO2 20 10/27/2022    Lab Results  Component Value Date   CHOL 174 10/27/2022   HDL 47 10/27/2022   LDLCALC 100 (H) 10/27/2022   TRIG 153 (H) 10/27/2022   CHOLHDL 3.7 10/27/2022    Medications Reviewed Today     Reviewed by Danella Maiers, [facility] (Pharmacist) on 11/17/22 at 1437  Med List Status: <None>   Medication Order Taking? Sig Documenting Provider Last Dose Status Informant  amLODipine (NORVASC) 5 MG tablet [id number] No Take 1 tablet (5 mg total) by mouth daily. Dettinger, Elige Radon, MD Taking Active   aspirin 81 MG tablet [id number] No Take 81 mg by mouth daily. [provider] Taking Active Spouse/Significant Other           Med Note Gunnar Fusi, MELISSA R   Mon Jul 25, 2021 12:16 PM) ON HOLD for procedure 07/29/21  b complex vitamins capsule [id number] No Take 1 capsule by mouth daily. [provider] Taking Active   Blood Glucose Monitoring Suppl (ACCU-CHEK AVIVA PLUS) w/Device KIT [id number] No USE to check blood sugars daily Dettinger, Elige Radon, MD Taking Active Spouse/Significant Other  Cholecalciferol (VITAMIN D3) 2000 units TABS [id number] No Take 4,000 Units by mouth every morning. [provider] Taking Active Spouse/Significant Other  dapagliflozin propanediol (FARXIGA) 10 MG TABS tablet [id number] No Take 1 tablet (10 mg total) by mouth daily before breakfast. Dettinger, Elige Radon, MD Taking Active   famotidine (  PEPCID) 20 MG tablet [id number] No Take 1 tablet (20 mg total) by mouth 2 (two) times daily. Dettinger, Elige Radon, MD Taking Active   glucose blood (ACCU-CHEK AVIVA PLUS) test strip [id number] No TEST BLOOD SUGAR 3 times DAILY DX E11.69 Dettinger, Elige Radon, MD Taking Active   insulin degludec (TRESIBA  FLEXTOUCH) 100 UNIT/ML FlexTouch Pen [id number] No Inject 10-20 Units into the skin at bedtime. Dettinger, Elige Radon, MD Taking Active   irbesartan (AVAPRO) 300 MG tablet [id number] No Take 1 tablet (300 mg total) by mouth daily. Dettinger, Elige Radon, MD Taking Active   meclizine (ANTIVERT) 25 MG tablet [id number] No Take 1 tablet (25 mg total) by mouth 3 (three) times daily as needed for dizziness. Dettinger, Elige Radon, MD Taking Active Spouse/Significant Other  Omega-3 Fatty Acids (FISH OIL) 1000 MG CAPS [id number] No Take 1,000 mg by mouth in the morning and at bedtime. [provider] Taking Active Spouse/Significant Other  pravastatin (PRAVACHOL) 20 MG tablet [id number] No Take 1 tablet (20 mg total) by mouth daily. Dettinger, Elige Radon, MD Taking Active   Probiotic Product (PROBIOTIC-10 PO) [id number] No Take 1 capsule by mouth daily. [provider] Taking Active Spouse/Significant Other  Psyllium (METAMUCIL) 28.3 % POWD [id number] No Take 3 Scoops by mouth daily. [provider] Taking Active Spouse/Significant Other  tamsulosin (FLOMAX) 0.4 MG CAPS capsule [id number] No Take 1 capsule (0.4 mg total) by mouth daily. Dettinger, Elige Radon, MD Taking Active   TRUEplus Lancets 28G MISC [id number] No Use to check blood sugar once daily and PRN Dx. E11.9 Dettinger, Elige Radon, MD Taking Active Spouse/Significant Other              Assessment/Plan:   Diabetes: - Currently controlled-A1c 7.1 - Reviewed long term cardiovascular and renal outcomes of uncontrolled blood sugar - Reviewed goal A1c, goal fasting, and goal 2 hour post prandial glucose - Reviewed dietary modifications including FOLLOWING A HEART HEALTHY DIET/HEALTHY PLATE METHOD - Reviewed lifestyle modifications including: - Recommend to  Continue Tresiba 18-20 units daily Continue Farxiga (now >$100/month-has enough for 1 month)  Apply for PAP via [name] PAP-- message routed to CPhT - Recommend to check glucose  daily (fasting) or if symptomatic - libre 3 plus CGM order sent to advanced diabetes supply (DME mail order; patient given phone number to call to check on order status    Follow Up Plan: 4-6 weeks 30 min of patient care was provided to the patient during this visit time    Kieth Brightly, PharmD, BCACP Clinical Pharmacist, [facility]

## 2022-11-17 NOTE — Telephone Encounter (Signed)
New farxiga PAP Can you email me PAP for farxiga 10mg  daily? Escribed RX to [facility]  Thank you!

## 2022-11-20 NOTE — Telephone Encounter (Signed)
App emailed :)

## 2022-11-24 NOTE — Telephone Encounter (Signed)
error 

## 2022-11-28 NOTE — Telephone Encounter (Signed)
Submitted application for FARXIGA to [name] for patient assistance.   Phone: [phone]  

## 2022-12-01 NOTE — Telephone Encounter (Signed)
Received notification from [name] regarding approval for [facility]. Patient assistance approved from 11/29/22 to 04/03/23.  Medication will ship to pt's home  Phone: [phone]

## 2022-12-06 NOTE — Progress Notes (Signed)
12/07/2022 Name: Jeffrey Rubio MRN: [id number] DOB: [birth year]  Chief Complaint  Patient presents with   Diabetes    Subjective:  Jeffrey Rubio is a 77 y.o. year old male who presented for a face to face visit.   They were referred to the pharmacist by their PCP for assistance in managing diabetes. He was recently had [place]& [place] PAP approved for Farxiga 10 mg from 11/29/2022 to 04/03/2023. He is new start on Freestyle Libre 3 CGM (via DME mail order; received 11/23/2022).  He first started using Libre 3 CGM on August 24th and since then he has gone through four sensors. Each sensor did not last more than 72 hours. The reader would run out of battery within a day or lose signal. Jeffrey Rubio and his wife contacted the [employer] customer service but were told the reader should be working fine after going through diagnostic tests with the customer service representative. Despite this the reader would continue to not work correctly. They would like to switch from the reader to the [place] app on an iPhone.   Care Team: Primary Care Provider: Dettinger, Jeffrey Radon, MD  Medication Access/Adherence  [facility]acy[facility]  [phone] [place], [place] - [address] [place] [address] Phone: [phone] Fax: [phone]  [id number] [place], [place] - [address] AT [facility] [address] [place] [address] Phone: [phone] Fax: [phone]  [id number] - [place], [place] - [address] [facility] [address] [place] [address] Phone: [phone] Fax: [phone]  [facility] [place], [place] - [facility] AT Portal to Registered [facility] Sites [address] [place] [address] Phone: [phone] Fax: [phone]  [phone] [place], [place] - [address] [place] [address] Phone: [phone] Fax: [phone]  [facility] - [place], [place] - [address] [place] [address] Phone: [phone] Fax: [phone]   Patient reports affordability concerns with their medications: No  Patient reports access/transportation concerns to their pharmacy: No  Patient reports adherence concerns with their medications:  No     Diabetes:  Current medications:  -Tresiba 19 units each morning -Farxiga 10mg  daily  Medications tried in the past:  -Ozempic (pancreatic insufficiency-d/c'd by GI), -Glimepiride -Metformin -Sitagliptin -Pioglitazone  New start on Freestyle Libre 3 CGM. (Will now use iphone as reader--his reader did not seem to connect)  Current glucose readings: He reported when he was able to get his reader to work his BG was typically between 80-150. He did note immediately after eating is BG will rise as high as ~230, but will drop to less than 150 after 2 hours. He has found the CGM especially useful in determining which foods have the greatest impact on his BG. In example, he noticed his BG spiked very high after eating multi-grain cheerios with milk.    Patient denies hypoglycemic s/sx including dizziness, shakiness, sweating. Patient denies hyperglycemic symptoms including polyuria, polydipsia, polyphagia, nocturia, neuropathy, blurred vision.  Current meal patterns:  - Breakfast: 1 egg 1/4 cup of grits, 1 strip of bacon or a sausage, 1 piece of wheat bread, coffee/water/milk  -His wife typically cooks lunch and dinner. They eat plenty of vegetables (green beans).  -He does enjoy ice-cream on occasion, but only a small  portion.   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   Current medication access support: [place]& me PAP approved for Farxiga 10 mg from 11/29/2022 to 04/03/2023.    Objective:  Lab Results  Component Value Date   HGBA1C 7.1 (H) 10/27/2022    Lab  Results  Component Value Date   CREATININE 1.49 (H) 10/27/2022   BUN 32 (H) 10/27/2022   NA 141 10/27/2022   K 4.8 10/27/2022   CL 108 (H) 10/27/2022   CO2 20 10/27/2022    Lab Results  Component Value Date   CHOL 174 10/27/2022   HDL 47 10/27/2022   LDLCALC 100 (H) 10/27/2022   TRIG 153 (H) 10/27/2022   CHOLHDL 3.7 10/27/2022    Medications Reviewed Today     Reviewed by Gwenlyn Found, RPH (Pharmacist) on 12/07/22 at 1723  Med List Status: <None>   Medication Order Taking? Sig Documenting Provider Last Dose Status Informant  amLODipine (NORVASC) 5 MG tablet [id number]  Take 1 tablet (5 mg total) by mouth daily. Rubio, Jeffrey Radon, MD  Active   aspirin 81 MG tablet [id number]  Take 81 mg by mouth daily. [provider]  Active Spouse/Significant Other           Med Note Gunnar Fusi, MELISSA R   Mon Jul 25, 2021 12:16 PM) ON HOLD for procedure 07/29/21  b complex vitamins capsule [id number]  Take 1 capsule by mouth daily. [provider]  Active   Blood Glucose Monitoring Suppl (ACCU-CHEK AVIVA PLUS) w/Device KIT [id number]  USE to check blood sugars daily Rubio, Jeffrey Radon, MD  Active Spouse/Significant Other  Cholecalciferol (VITAMIN D3) 2000 units TABS [id number]  Take 4,000 Units by mouth every morning. [provider]  Active Spouse/Significant Other  Continuous Glucose Sensor (FREESTYLE LIBRE 3 PLUS SENSOR) MISC [id number]  1 each by Does not apply route See admin instructions. Place 1 sensor on back of arm every 15 days. DX: E11.65 Rubio, Jeffrey Radon, MD  Active   dapagliflozin propanediol (FARXIGA) 10 MG TABS tablet [id number] Yes Take 1 tablet (10 mg total) by mouth daily before breakfast. Rubio, Jeffrey Radon, MD Taking Active   famotidine (PEPCID) 20 MG tablet [id number]  Take 1 tablet (20 mg total) by mouth 2 (two) times daily. Rubio, Jeffrey Radon, MD  Active   glucose blood (ACCU-CHEK AVIVA PLUS) test strip [id number]  TEST BLOOD SUGAR 3 times  DAILY DX E11.69 Rubio, Jeffrey Radon, MD  Active   insulin degludec (TRESIBA FLEXTOUCH) 100 UNIT/ML FlexTouch Pen [id number] Yes Inject 10-20 Units into the skin at bedtime. Rubio, Jeffrey Radon, MD Taking Active            Med Note Luz Brazen Dec 07, 2022  5:06 PM) Patient taking differently: 19 units into the skin each morning  irbesartan (AVAPRO) 300 MG tablet [id number]  Take 1 tablet (300 mg total) by mouth daily. Rubio, Jeffrey Radon, MD  Active   meclizine (ANTIVERT) 25 MG tablet [id number]  Take 1 tablet (25 mg total) by mouth 3 (three) times daily as needed for dizziness. Rubio, Jeffrey Radon, MD  Active Spouse/Significant Other  Omega-3 Fatty Acids (FISH OIL) 1000 MG CAPS [id number]  Take 1,000 mg by mouth in the morning and at bedtime. [provider]  Active Spouse/Significant Other  pravastatin (PRAVACHOL) 20 MG tablet [id number]  Take 1 tablet (20 mg total) by mouth daily. Rubio, Jeffrey Radon, MD  Active   Probiotic Product (PROBIOTIC-10 PO)  [id number]  Take 1 capsule by mouth daily. [provider]  Active Spouse/Significant Other  Psyllium (METAMUCIL) 28.3 % POWD [id number]  Take 3 Scoops by mouth daily. [provider]  Active Spouse/Significant Other  tamsulosin (FLOMAX) 0.4 MG CAPS capsule [id number]  Take 1 capsule (0.4 mg total) by mouth daily. Rubio, Jeffrey Radon, MD  Active   TRUEplus Lancets 28G MISC [id number]  Use to check blood sugar once daily and PRN Dx. E11.9 Rubio, Jeffrey Radon, MD  Active Spouse/Significant Other              Assessment/Plan:   Diabetes: - Currently controlled. A1c 7.1.  - Reviewed long term cardiovascular and renal outcomes of uncontrolled blood sugar. - Reviewed goal A1c, goal fasting, and goal 2 hour post prandial glucose. - Reviewed dietary modifications including continue to consume vegetables and eating sweets in moderation. 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  -He will message on MyChart once the [place] sensor completes syncing with his iPhone -Continue current regimen-->will start Farxiga once PAP shipment arrives to patient home    Follow Up Plan: Telephonic visit in 3-4 weeks  Sofie Rower, PharmD, [facility] PGY1   Kieth Brightly, PharmD, BCACP Clinical Pharmacist, [facility]

## 2022-12-07 ENCOUNTER — Other Ambulatory Visit (INDEPENDENT_AMBULATORY_CARE_PROVIDER_SITE_OTHER): Payer: Medicare Other | Admitting: Pharmacist

## 2022-12-07 DIAGNOSIS — E119 Type 2 diabetes mellitus without complications: Secondary | ICD-10-CM | POA: Diagnosis not present

## 2022-12-07 DIAGNOSIS — Z794 Long term (current) use of insulin: Secondary | ICD-10-CM | POA: Diagnosis not present

## 2022-12-13 ENCOUNTER — Encounter: Payer: Self-pay | Admitting: Family Medicine

## 2022-12-18 NOTE — Telephone Encounter (Signed)
Spouse calls in and states she has had to remove both sensors and needs Raynelle Fanning to call them. They are on vacation and only brought two sensors with them and has one left. Still has the finger stick monitor with them. Patient is on [place] and is getting text messages that he has to re enroll right now and thinks it is a scam.

## 2022-12-28 ENCOUNTER — Other Ambulatory Visit: Payer: Medicare Other

## 2023-01-02 NOTE — Progress Notes (Signed)
01/04/2023 Name: Jeffrey Rubio MRN: [id number] DOB: [birth year]  Chief Complaint  Patient presents with   Diabetes   Subjective:  Jeffrey Rubio is a 77 y.o. year old male who presented for a telephone visit.  I connected with  Jeffrey Rubio on 01/04/23 by telephone and verified that I am speaking with the correct person using two identifiers.  I discussed the limitations of evaluation and management by telemedicine. The patient expressed understanding and agreed to proceed.  Patient was located in her home and PharmD in PCP office during this visit.   They were referred to the pharmacist by their PCP for assistance in managing diabetes. Since switching from the [place] reader to the [place] app on his iPhone, he has found it much easier to monitor his blood glucose. He and his wife state after his last visit, his sensor only lasted a few days. They applied a new sensor and went on vacation. That sensor was able to last the full 14 days.   He is doing well on the Farxiga, he has not missed any doses. He has had no reoccurrence of the yeast/rash at his groin.   Care Team: Primary Care Provider: Dettinger, Elige Radon, MD   Medication Access/Adherence  [facility]  [phone] [place], [place] - [address] [place] [address] Phone: [phone] Fax: [phone]  [facility] - [place], [place] - [address] [place] [address] Phone: [phone] Fax: [phone]   Patient reports affordability concerns with their medications: Yes  (farxiga) Patient reports access/transportation concerns to their pharmacy: No  Patient reports adherence concerns with their medications:  No     Diabetes:  Current medications:  -Tresiba 20 units each morning (increased from 19 units) -Farxiga 10 mg daily   Medications tried in the past:  -Ozempic (pancreatic insufficiency-d/c'd by  GI), -Glimepiride -Metformin -Sitagliptin -Pioglitazone  He uses Libre 3 CGM (via DME mail order--[facility] via parachute portal). He does also use the Accu Check Guide meter system when his CGM is not available.      Patient denies hypoglycemic s/sx including dizziness, shakiness, sweating. Patient denies hyperglycemic symptoms including polyuria, polydipsia, polyphagia, nocturia, neuropathy, blurred vision.  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   Current medication access support:  [name] Marcelline [place]; 11/29/2022-04/03/2023)   Objective:  Lab Results  Component Value Date   HGBA1C 7.1 (H) 10/27/2022    Lab Results  Component Value Date   CREATININE 1.49 (H) 10/27/2022   BUN 32 (H) 10/27/2022   NA 141 10/27/2022   K 4.8 10/27/2022   CL 108 (H) 10/27/2022   CO2 20 10/27/2022    Lab Results  Component Value Date   CHOL 174 10/27/2022   HDL 47 10/27/2022   LDLCALC 100 (H) 10/27/2022   TRIG 153 (H) 10/27/2022   CHOLHDL 3.7 10/27/2022    Medications Reviewed Today     Reviewed by Gwenlyn Found, RPH (Pharmacist) on 01/04/23 at 0859  Med List Status: <None>   Medication Order Taking? Sig Documenting Provider Last Dose Status Informant  amLODipine (NORVASC) 5 MG tablet [id number]  Take 1 tablet (5 mg total) by mouth daily. Dettinger, Elige Radon, MD  Active   aspirin 81 MG tablet [id number]  Take 81 mg by mouth daily. [provider]  Active Spouse/Significant Other  Med Note Gunnar Fusi, MELISSA R   Mon Jul 25, 2021 12:16 PM) ON HOLD for procedure 07/29/21  b complex vitamins capsule [id number]  Take 1 capsule by mouth daily. [provider]  Active   Blood Glucose Monitoring Suppl (ACCU-CHEK AVIVA PLUS) w/Device KIT [id number]  USE to check blood sugars daily Dettinger, Elige Radon, MD  Active Spouse/Significant Other  Cholecalciferol (VITAMIN D3) 2000 units  TABS [id number]  Take 4,000 Units by mouth every morning. [provider]  Active Spouse/Significant Other  Continuous Glucose Sensor (FREESTYLE LIBRE 3 PLUS SENSOR) MISC [id number]  1 each by Does not apply route See admin instructions. Place 1 sensor on back of arm every 15 days. DX: E11.65 Dettinger, Elige Radon, MD  Active   dapagliflozin propanediol (FARXIGA) 10 MG TABS tablet [id number] Yes Take 1 tablet (10 mg total) by mouth daily before breakfast. Dettinger, Elige Radon, MD Taking Active   famotidine (PEPCID) 20 MG tablet [id number]  Take 1 tablet (20 mg total) by mouth 2 (two) times daily. Dettinger, Elige Radon, MD  Active   glucose blood (ACCU-CHEK AVIVA PLUS) test strip [id number]  TEST BLOOD SUGAR 3 times DAILY DX E11.69 Dettinger, Elige Radon, MD  Active   insulin degludec (TRESIBA FLEXTOUCH) 100 UNIT/ML FlexTouch Pen [id number] Yes Inject 10-20 Units into the skin at bedtime. Dettinger, Elige Radon, MD Taking Active            Med Note Luz Brazen Jan 04, 2023  8:58 AM) Patient taking differently: 20 units into the skin each morning  irbesartan (AVAPRO) 300 MG tablet [id number]  Take 1 tablet (300 mg total) by mouth daily. Dettinger, Elige Radon, MD  Active   meclizine (ANTIVERT) 25 MG tablet [id number]  Take 1 tablet (25 mg total) by mouth 3 (three) times daily as needed for dizziness. Dettinger, Elige Radon, MD  Active Spouse/Significant Other  Omega-3 Fatty Acids (FISH OIL) 1000 MG CAPS [id number]  Take 1,000 mg by mouth in the morning and at bedtime. [provider]  Active Spouse/Significant Other  pravastatin (PRAVACHOL) 20 MG tablet [id number]  Take 1 tablet (20 mg total) by mouth daily. Dettinger, Elige Radon, MD  Active   Probiotic Product (PROBIOTIC-10 PO) [id number]  Take 1 capsule by mouth daily. [provider]  Active Spouse/Significant Other  Psyllium (METAMUCIL) 28.3 % POWD [id number]  Take 3 Scoops by mouth daily. [provider]  Active Spouse/Significant  Other  tamsulosin (FLOMAX) 0.4 MG CAPS capsule [id number]  Take 1 capsule (0.4 mg total) by mouth daily. Dettinger, Elige Radon, MD  Active   TRUEplus Lancets 28G MISC [id number]  Use to check blood sugar once daily and PRN Dx. E11.9 Dettinger, Elige Radon, MD  Active Spouse/Significant Other              Assessment/Plan:   Diabetes: - Currently uncontrolled. A1c 7.1, but he is very close to goal A1c (<7). His TIR is at goal at 85% (>70%) and GMI is 6.9. He is headed in the right direction in terms of blood sugar control.  - Recommend to continue Farxiga 10 mg daily and Tresiba 20 units each morning.   - Recommend to check glucose using Libre 3 CGM  - Meets financial criteria for [name] patient assistance program through 04/03/2023. He has started the enrollment renewal process for 2025.    Follow Up Plan: Patient will follow up as needed.   Sofie Rower, PharmD [facility] PGY1   Kieth Brightly, PharmD, BCACP,  CPP Clinical Pharmacist, [facility]

## 2023-01-04 ENCOUNTER — Other Ambulatory Visit (INDEPENDENT_AMBULATORY_CARE_PROVIDER_SITE_OTHER): Payer: Medicare Other

## 2023-01-04 DIAGNOSIS — E1169 Type 2 diabetes mellitus with other specified complication: Secondary | ICD-10-CM

## 2023-01-04 DIAGNOSIS — E119 Type 2 diabetes mellitus without complications: Secondary | ICD-10-CM

## 2023-01-04 DIAGNOSIS — Z794 Long term (current) use of insulin: Secondary | ICD-10-CM

## 2023-01-04 MED ORDER — DAPAGLIFLOZIN PROPANEDIOL 10 MG PO TABS
10.0000 mg | ORAL_TABLET | Freq: Every day | ORAL | 4 refills | Status: DC
Start: 2023-01-04 — End: 2023-07-25

## 2023-02-12 ENCOUNTER — Telehealth: Payer: Self-pay

## 2023-02-12 NOTE — Progress Notes (Signed)
Pharmacy Medication Assistance Program Note    02/12/2023  Patient ID: Jeffrey Rubio, male   DOB: 23-Dec-1945, 77 y.o.   MRN: [id number]     02/12/2023  Outreach Medication One  Initial Outreach Date (Medication One) 12/19/2022  Manufacturer Medication One [employer]  [employer] [facility] Farxiga  Dose of Farxiga 10MG   Type of Radiographer, therapeutic Assistance  Patient Assistance Determination Approved  Approval Start Date 04/04/2023  Approval End Date 04/02/2024       Received RE-ENROLLMENT notification from [name] regarding approval for [name] Patient assistance approved from 04/04/23 to 04/02/24.  Medication will ship to PATIENTS HOME  Pt ID: [id number]  Company phone: [phone]

## 2023-02-19 ENCOUNTER — Ambulatory Visit: Payer: Medicare Other | Admitting: Family Medicine

## 2023-03-15 ENCOUNTER — Ambulatory Visit: Payer: Medicare Other | Admitting: Family Medicine

## 2023-03-15 ENCOUNTER — Encounter: Payer: Self-pay | Admitting: Family Medicine

## 2023-03-15 VITALS — BP 162/71 | HR 71 | Temp 97.4°F | Resp 20 | Ht 67.0 in | Wt 180.0 lb

## 2023-03-15 DIAGNOSIS — E1159 Type 2 diabetes mellitus with other circulatory complications: Secondary | ICD-10-CM

## 2023-03-15 DIAGNOSIS — N401 Enlarged prostate with lower urinary tract symptoms: Secondary | ICD-10-CM

## 2023-03-15 DIAGNOSIS — R3912 Poor urinary stream: Secondary | ICD-10-CM | POA: Diagnosis not present

## 2023-03-15 DIAGNOSIS — E785 Hyperlipidemia, unspecified: Secondary | ICD-10-CM

## 2023-03-15 DIAGNOSIS — E1169 Type 2 diabetes mellitus with other specified complication: Secondary | ICD-10-CM | POA: Diagnosis not present

## 2023-03-15 DIAGNOSIS — I152 Hypertension secondary to endocrine disorders: Secondary | ICD-10-CM

## 2023-03-15 DIAGNOSIS — Z23 Encounter for immunization: Secondary | ICD-10-CM | POA: Diagnosis not present

## 2023-03-15 DIAGNOSIS — J0100 Acute maxillary sinusitis, unspecified: Secondary | ICD-10-CM

## 2023-03-15 LAB — BAYER DCA HB A1C WAIVED: HB A1C (BAYER DCA - WAIVED): 6.6 % — ABNORMAL HIGH (ref 4.8–5.6)

## 2023-03-15 MED ORDER — AMLODIPINE BESYLATE 5 MG PO TABS: 5.0000 mg | ORAL_TABLET | Freq: Every day | ORAL | 3 refills | Status: DC

## 2023-03-15 MED ORDER — ACCU-CHEK AVIVA PLUS VI STRP: ORAL_STRIP | 9 refills | Status: DC

## 2023-03-15 MED ORDER — AMOXICILLIN 500 MG PO CAPS: 500.0000 mg | ORAL_CAPSULE | Freq: Two times a day (BID) | ORAL | 0 refills | Status: DC

## 2023-03-15 MED ORDER — TRESIBA FLEXTOUCH 100 UNIT/ML ~~LOC~~ SOPN: 10.0000 [IU] | PEN_INJECTOR | Freq: Every day | SUBCUTANEOUS | 11 refills | Status: DC

## 2023-03-15 NOTE — Progress Notes (Signed)
BP (!) 162/71   Pulse 71   Temp (!) 97.4 F (36.3 C) (Temporal)   Resp 20   Ht 5\' 7"  (1.702 m)   Wt 180 lb (81.6 kg)   SpO2 97%   BMI 28.19 kg/m    Subjective:   Patient ID: [name], male    DOB: [birth year], 77 y.o.   MRN: [id number]  HPI: Danen Nicanor is a 77 y.o. male presenting on 03/15/2023 for Medical Management of Chronic Issues, Diabetes, and Hypertension   HPI Type 2 diabetes mellitus Patient comes in today for recheck of his diabetes. Patient has been currently taking [place]. Patient is currently on an ACE inhibitor/ARB. Patient has seen an ophthalmologist this year. Patient denies any new issues with their feet. The symptom started onset as an adult hypertension and hyperlipidemia ARE RELATED TO DM   Hypertension Patient is currently on irbesartan and amlodipine, and their blood pressure today is 162/71 but not feeling well today, says runs better at home, also did not take his medicine yet this morning. Patient denies any lightheadedness or dizziness. Patient denies headaches, blurred vision, chest pains, shortness of breath, or weakness. Denies any side effects from medication and is content with current medication.   Hyperlipidemia Patient is coming in for recheck of his hyperlipidemia. The patient is currently taking pravastatin and fish oils. They deny any issues with myalgias or history of liver damage from it. They deny any focal numbness or weakness or chest pain.   Patient has been having some cough and congestion and chest congestion that started about 4 weeks ago but has been worsening over the past week where his dog and down his chest and the cough is productive.  He has been trying some over-the-counter [place] and some other medicines and just does not seem to be improving.  He does have where he feels a little short of breath with the chest congestion but denies any major wheezing.  He is having some sinus congestion and drainage  postnasal drainage.  His wife also says that she has been getting sick as well this week with similar.  Relevant past medical, surgical, family and social history reviewed and updated as indicated. Interim medical history since our last visit reviewed. Allergies and medications reviewed and updated.  Review of Systems  Constitutional:  Negative for chills and fever.  HENT:  Positive for congestion, sinus pressure and sinus pain. Negative for sneezing and sore throat.   Eyes:  Negative for visual disturbance.  Respiratory:  Positive for cough and shortness of breath. Negative for wheezing.   Cardiovascular:  Negative for chest pain and leg swelling.  Musculoskeletal:  Negative for back pain and gait problem.  Skin:  Negative for rash.  Neurological:  Negative for dizziness, weakness and light-headedness.  All other systems reviewed and are negative.   Per HPI unless specifically indicated above   Allergies as of 03/15/2023       Reactions   Farxiga [dapagliflozin] Other (See Comments)   Reports yeast in the groin upon initiation, but tolerating Farxiga now & no concerns   Hydrocodone-acetaminophen    Causes Anxiety    Ozempic (0.25 Or 0.5 Mg-dose) [semaglutide(0.25 Or 0.5mg -dos)] Other (See Comments)   Pancreatic insufficiency, GI--diarrhea        Medication List        Accurate as of March 15, 2023 11:51 AM. If you have any questions, ask your nurse or doctor.  STOP taking these medications    famotidine 20 MG tablet Commonly known as: PEPCID Stopped by: Elige Radon Carah Barrientes       TAKE these medications    Accu-Chek Aviva Plus test strip Generic drug: glucose blood TEST BLOOD SUGAR 3 times DAILY DX E11.69   Accu-Chek Aviva Plus w/Device Kit USE to check blood sugars daily   amLODipine 5 MG tablet Commonly known as: NORVASC Take 1 tablet (5 mg total) by mouth daily.   amoxicillin 500 MG capsule Commonly known as: AMOXIL Take 1 capsule (500 mg  total) by mouth 2 (two) times daily. Started by: Elige Radon Jerre Vandrunen   aspirin 81 MG tablet Take 81 mg by mouth daily.   b complex vitamins capsule Take 1 capsule by mouth daily.   dapagliflozin propanediol 10 MG Tabs tablet Commonly known as: Farxiga Take 1 tablet (10 mg total) by mouth daily before breakfast.   Fish Oil 1000 MG Caps Take 1,000 mg by mouth in the morning and at bedtime.   FreeStyle Libre 3 Plus Sensor Misc 1 each by Does not apply route See admin instructions. Place 1 sensor on back of arm every 15 days. DX: E11.65   irbesartan 300 MG tablet Commonly known as: AVAPRO Take 1 tablet (300 mg total) by mouth daily.   meclizine 25 MG tablet Commonly known as: ANTIVERT Take 1 tablet (25 mg total) by mouth 3 (three) times daily as needed for dizziness.   Metamucil 28.3 % Powd Generic drug: Psyllium Take 3 Scoops by mouth daily.   pravastatin 20 MG tablet Commonly known as: PRAVACHOL Take 1 tablet (20 mg total) by mouth daily.   PROBIOTIC-10 PO Take 1 capsule by mouth daily.   tamsulosin 0.4 MG Caps capsule Commonly known as: FLOMAX Take 1 capsule (0.4 mg total) by mouth daily.   Evaristo Bury FlexTouch 100 UNIT/ML FlexTouch Pen Generic drug: insulin degludec Inject 10-20 Units into the skin at bedtime.   TRUEplus Lancets 28G Misc Use to check blood sugar once daily and PRN Dx. E11.9   Vitamin D3 50 MCG (2000 UT) Tabs Take 4,000 Units by mouth every morning.         Objective:   BP (!) 162/71   Pulse 71   Temp (!) 97.4 F (36.3 C) (Temporal)   Resp 20   Ht 5\' 7"  (1.702 m)   Wt 180 lb (81.6 kg)   SpO2 97%   BMI 28.19 kg/m   Wt Readings from Last 3 Encounters:  03/15/23 180 lb (81.6 kg)  07/21/22 180 lb (81.6 kg)  04/24/22 175 lb (79.4 kg)    Physical Exam Vitals and nursing note reviewed.  Constitutional:      General: He is not in acute distress.    Appearance: He is well-developed. He is not diaphoretic.  HENT:     Right Ear:  Tympanic membrane, ear canal and external ear normal.     Left Ear: Tympanic membrane, ear canal and external ear normal.     Nose:     Right Sinus: No maxillary sinus tenderness or frontal sinus tenderness.     Left Sinus: No maxillary sinus tenderness or frontal sinus tenderness.     Mouth/Throat:     Pharynx: Uvula midline. No oropharyngeal exudate or posterior oropharyngeal erythema.     Tonsils: No tonsillar abscesses.  Eyes:     General: No scleral icterus.    Conjunctiva/sclera: Conjunctivae normal.  Neck:     Thyroid: No thyromegaly.  Cardiovascular:  Rate and Rhythm: Normal rate and regular rhythm.     Heart sounds: Normal heart sounds. No murmur heard. Pulmonary:     Effort: Pulmonary effort is normal. No respiratory distress.     Breath sounds: Normal breath sounds. No stridor. No wheezing, rhonchi or rales.  Musculoskeletal:        General: No swelling. Normal range of motion.     Cervical back: Neck supple.  Lymphadenopathy:     Cervical: No cervical adenopathy.  Skin:    General: Skin is warm and dry.     Findings: No rash.  Neurological:     Mental Status: He is alert and oriented to person, place, and time.     Coordination: Coordination normal.  Psychiatric:        Behavior: Behavior normal.       Assessment & Plan:   Problem List Items Addressed This Visit       Cardiovascular and Mediastinum   Hypertension associated with diabetes (HCC)   Relevant Medications   amLODipine (NORVASC) 5 MG tablet   insulin degludec (TRESIBA FLEXTOUCH) 100 UNIT/ML FlexTouch Pen   Other Relevant Orders   CBC with Differential/Platelet   CMP14+EGFR   Lipid panel   Bayer DCA Hb A1c Waived     Endocrine   Hyperlipidemia associated with type 2 diabetes mellitus (HCC)   Relevant Medications   amLODipine (NORVASC) 5 MG tablet   insulin degludec (TRESIBA FLEXTOUCH) 100 UNIT/ML FlexTouch Pen   Other Relevant Orders   CBC with Differential/Platelet   CMP14+EGFR    Lipid panel   Bayer DCA Hb A1c Waived   DM (diabetes mellitus) (HCC) - Primary   Relevant Medications   glucose blood (ACCU-CHEK AVIVA PLUS) test strip   insulin degludec (TRESIBA FLEXTOUCH) 100 UNIT/ML FlexTouch Pen   Other Relevant Orders   CBC with Differential/Platelet   CMP14+EGFR   Lipid panel   Bayer DCA Hb A1c Waived     Genitourinary   Benign prostatic hyperplasia   Relevant Orders   PSA, total and free   Other Visit Diagnoses       Encounter for immunization       Relevant Orders   Flu Vaccine Trivalent High Dose (Fluad) (Completed)     Acute non-recurrent maxillary sinusitis       Relevant Medications   amoxicillin (AMOXIL) 500 MG capsule       With his illness going on for more than a few weeks, will do amoxicillin is a big improvement also recommend Mucinex and Benadryl  A1c was good at 6.6, he is doing well there, continue with diet and monitoring. Follow up plan: Return in about 3 months (around 06/13/2023), or if symptoms worsen or fail to improve, for Diabetes recheck.  Counseling provided for all of the vaccine components Orders Placed This Encounter  Procedures   Flu Vaccine Trivalent High Dose (Fluad)   CBC with Differential/Platelet   CMP14+EGFR   Lipid panel   Bayer DCA Hb A1c Waived   PSA, total and free    Arville Care, MD [facility] 03/15/2023, 11:51 AM

## 2023-03-16 LAB — CBC WITH DIFFERENTIAL/PLATELET
Basophils Absolute: 0 10*3/uL (ref 0.0–0.2)
Basos: 1 %
EOS (ABSOLUTE): 0.1 10*3/uL (ref 0.0–0.4)
Eos: 1 %
Hematocrit: 43 % (ref 37.5–51.0)
Hemoglobin: 14.2 g/dL (ref 13.0–17.7)
Immature Grans (Abs): 0 10*3/uL (ref 0.0–0.1)
Immature Granulocytes: 0 %
Lymphocytes Absolute: 2.2 10*3/uL (ref 0.7–3.1)
Lymphs: 26 %
MCH: 29.5 pg (ref 26.6–33.0)
MCHC: 33 g/dL (ref 31.5–35.7)
MCV: 89 fL (ref 79–97)
Monocytes Absolute: 0.8 10*3/uL (ref 0.1–0.9)
Monocytes: 9 %
Neutrophils Absolute: 5.2 10*3/uL (ref 1.4–7.0)
Neutrophils: 63 %
Platelets: 265 10*3/uL (ref 150–450)
RBC: 4.82 x10E6/uL (ref 4.14–5.80)
RDW: 12.7 % (ref 11.6–15.4)
WBC: 8.3 10*3/uL (ref 3.4–10.8)

## 2023-03-16 LAB — CMP14+EGFR
ALT: 14 [IU]/L (ref 0–44)
AST: 13 [IU]/L (ref 0–40)
Albumin: 3.9 g/dL (ref 3.8–4.8)
Alkaline Phosphatase: 110 [IU]/L (ref 44–121)
BUN/Creatinine Ratio: 19 (ref 10–24)
BUN: 25 mg/dL (ref 8–27)
Bilirubin Total: <0.2 mg/dL (ref 0.0–1.2)
CO2: 21 mmol/L (ref 20–29)
Calcium: 9.5 mg/dL (ref 8.6–10.2)
Chloride: 109 mmol/L — ABNORMAL HIGH (ref 96–106)
Creatinine, Ser: 1.33 mg/dL — ABNORMAL HIGH (ref 0.76–1.27)
Globulin, Total: 2.6 g/dL (ref 1.5–4.5)
Glucose: 134 mg/dL — ABNORMAL HIGH (ref 70–99)
Potassium: 4.8 mmol/L (ref 3.5–5.2)
Sodium: 143 mmol/L (ref 134–144)
Total Protein: 6.5 g/dL (ref 6.0–8.5)
eGFR: 55 mL/min/{1.73_m2} — ABNORMAL LOW (ref >59)

## 2023-03-16 LAB — LIPID PANEL
Chol/HDL Ratio: 3.5 {ratio} (ref 0.0–5.0)
Cholesterol, Total: 142 mg/dL (ref 100–199)
HDL: 41 mg/dL (ref >39)
LDL Chol Calc (NIH): 67 mg/dL (ref 0–99)
Triglycerides: 206 mg/dL — ABNORMAL HIGH (ref 0–149)
VLDL Cholesterol Cal: 34 mg/dL (ref 5–40)

## 2023-03-16 LAB — PSA, TOTAL AND FREE
PSA, Free Pct: 23.3 %
PSA, Free: 0.14 ng/mL
Prostate Specific Ag, Serum: 0.6 ng/mL (ref 0.0–4.0)

## 2023-04-26 ENCOUNTER — Ambulatory Visit: Payer: Medicare Other

## 2023-04-26 ENCOUNTER — Telehealth: Payer: Self-pay

## 2023-04-26 VITALS — Ht 67.0 in | Wt 180.0 lb

## 2023-04-26 DIAGNOSIS — Z0000 Encounter for general adult medical examination without abnormal findings: Secondary | ICD-10-CM

## 2023-04-26 NOTE — Patient Instructions (Signed)
Jeffrey Rubio , Thank you for taking time to come for your Medicare Wellness Visit. I appreciate your ongoing commitment to your health goals. Please review the following plan we discussed and let me know if I can assist you in the future.   Referrals/Orders/Follow-Ups/Clinician Recommendations: Aim for 30 minutes of exercise or brisk walking, 6-8 glasses of water, and 5 servings of fruits and vegetables each day.  This is a list of the screening recommended for you and due dates:  Health Maintenance  Topic Date Due   COVID-19 Vaccine (3 - Moderna risk series) 05/26/2020   Yearly kidney health urinalysis for diabetes  04/22/2023   Complete foot exam   07/21/2023   Eye exam for diabetics  08/01/2023   Hemoglobin A1C  09/13/2023   Yearly kidney function blood test for diabetes  03/14/2024   Medicare Annual Wellness Visit  04/25/2024   Colon Cancer Screening  03/24/2027   DTaP/Tdap/Td vaccine (2 - Td or Tdap) 09/28/2030   Pneumonia Vaccine  Completed   Flu Shot  Completed   Hepatitis C Screening  Completed   Zoster (Shingles) Vaccine  Completed   HPV Vaccine  Aged Out    Advanced directives: (ACP Link)Information on Advanced Care Planning can be found at [facility] Advance Health Care Directives Advance Health Care Directives ([email or web address]   Next Medicare Annual Wellness Visit scheduled for next year: Yes

## 2023-04-26 NOTE — Telephone Encounter (Signed)
Error

## 2023-04-26 NOTE — Progress Notes (Signed)
Subjective:   Jeffrey Rubio is a 78 y.o. male who presents for Medicare Annual/Subsequent preventive examination.  Visit Complete: Virtual I connected with  Jeffrey Rubio on 04/26/23 by a audio enabled telemedicine application and verified that I am speaking with the correct person using two identifiers.  Patient Location: Home  Provider Location: Home Office  This patient declined Interactive audio and video telecommunications. Therefore the visit was completed with audio only.  I discussed the limitations of evaluation and management by telemedicine. The patient expressed understanding and agreed to proceed.  Vital Signs: Because this visit was a virtual/telehealth visit, some criteria may be missing or patient reported. Any vitals not documented were not able to be obtained and vitals that have been documented are patient reported.  Patient Medicare AWV questionnaire was completed by the patient on 04/24/23; I have confirmed that all information answered by patient is correct and no changes since this date.  Cardiac Risk Factors include: advanced age (>37men, >40 women);diabetes mellitus;dyslipidemia;male gender;hypertension     Objective:    Today's Vitals   04/26/23 1159  Weight: 180 lb (81.6 kg)  Height: 5\' 7"  (1.702 m)   Body mass index is 28.19 kg/m.     04/26/2023   12:03 PM 04/24/2022   11:39 AM 03/14/2022   10:16 AM 07/28/2021    1:18 PM 04/06/2021   11:10 AM 04/05/2020   12:51 PM 03/21/2017    1:37 PM  Advanced Directives  Does Patient Have a Medical Advance Directive? No No No No No No No  Would patient like information on creating a medical advance directive? Yes (MAU/Ambulatory/Procedural Areas - Information given) No - Patient declined Yes (MAU/Ambulatory/Procedural Areas - Information given) No - Patient declined No - Patient declined Yes (MAU/Ambulatory/Procedural Areas - Information given) Yes (ED - Information included in AVS)    Current  Medications (verified) Outpatient Encounter Medications as of 04/26/2023  Medication Sig   amLODipine (NORVASC) 5 MG tablet Take 1 tablet (5 mg total) by mouth daily.   aspirin 81 MG tablet Take 81 mg by mouth daily.   b complex vitamins capsule Take 1 capsule by mouth daily.   Blood Glucose Monitoring Suppl (ACCU-CHEK AVIVA PLUS) w/Device KIT USE to check blood sugars daily   Cholecalciferol (VITAMIN D3) 2000 units TABS Take 4,000 Units by mouth every morning.   Continuous Glucose Sensor (FREESTYLE LIBRE 3 PLUS SENSOR) MISC 1 each by Does not apply route See admin instructions. Place 1 sensor on back of arm every 15 days. DX: E11.65   dapagliflozin propanediol (FARXIGA) 10 MG TABS tablet Take 1 tablet (10 mg total) by mouth daily before breakfast.   insulin degludec (TRESIBA FLEXTOUCH) 100 UNIT/ML FlexTouch Pen Inject 10-20 Units into the skin at bedtime.   irbesartan (AVAPRO) 300 MG tablet Take 1 tablet (300 mg total) by mouth daily.   meclizine (ANTIVERT) 25 MG tablet Take 1 tablet (25 mg total) by mouth 3 (three) times daily as needed for dizziness.   Omega-3 Fatty Acids (FISH OIL) 1000 MG CAPS Take 1,000 mg by mouth in the morning and at bedtime.   pravastatin (PRAVACHOL) 20 MG tablet Take 1 tablet (20 mg total) by mouth daily.   Probiotic Product (PROBIOTIC-10 PO) Take 1 capsule by mouth daily.   Psyllium (METAMUCIL) 28.3 % POWD Take 3 Scoops by mouth daily.   tamsulosin (FLOMAX) 0.4 MG CAPS capsule Take 1 capsule (0.4 mg total) by mouth daily.   [DISCONTINUED] amoxicillin (AMOXIL) 500 MG capsule  Take 1 capsule (500 mg total) by mouth 2 (two) times daily.   [DISCONTINUED] glucose blood (ACCU-CHEK AVIVA PLUS) test strip TEST BLOOD SUGAR 3 times DAILY DX E11.69   [DISCONTINUED] TRUEplus Lancets 28G MISC Use to check blood sugar once daily and PRN Dx. E11.9   No facility-administered encounter medications on file as of 04/26/2023.    Allergies (verified) Farxiga [dapagliflozin],  Hydrocodone-acetaminophen, and Ozempic (0.25 or 0.5 mg-dose) [semaglutide(0.25 or 0.5mg -dos)]   History: Past Medical History:  Diagnosis Date   Asthmatic bronchitis    BPH (benign prostatic hypertrophy)    Cataracts, bilateral    Colon polyps    Diabetes mellitus    Diverticulosis    Fibromyalgia    GERD (gastroesophageal reflux disease)    Gout    History of nephrectomy, unilateral    left    Hyperlipidemia    Hypertension    Single kidney    Past Surgical History:  Procedure Laterality Date   APPENDECTOMY     CHOLECYSTECTOMY     COLONOSCOPY W/ POLYPECTOMY     EYE SURGERY     catarat - right    hand fracture Left    plate in right hand from when he crushed it in a hay baler   LEFT HEART CATH AND CORONARY ANGIOGRAPHY N/A 01/11/2017   Procedure: LEFT HEART CATH AND CORONARY ANGIOGRAPHY;  Surgeon: Swaziland, Peter M, MD;  Location: [facility];  Service: Cardiovascular;  Laterality: N/A;   LUMBAR LAMINECTOMY N/A 07/29/2021   Procedure: RIGHT L3-4 MICRODISCECTOMY, and removal of fragment;  Surgeon: Eldred Manges, MD;  Location: [facility] OR;  Service: Orthopedics;  Laterality: N/A;   NEPHRECTOMY  1996   left , secondary to nephrolithiasis    ROTATOR CUFF REPAIR Right    Family History  Problem Relation Age of Onset   Coronary artery disease Other        family history    Hypertension Other        family history    Alzheimer's disease Mother 74   Cancer Father        prostate   Stroke Father    CAD Father 15       CABG   Hypertension Sister    Hypercalcemia Brother    Hypertension Brother    Kidney Stones Son    Breast cancer Maternal Grandmother    Diabetes Paternal Grandmother    Other Paternal Grandmother        Crite's disease   Suicidality Paternal Grandfather    Social History   Socioeconomic History   Marital status: Married    Spouse name: [name]   Number of children: 2   Years of education: Not on file   Highest education level: 12th grade   Occupational History   Occupation: retired    Comment: owns a farm  Tobacco Use   Smoking status: Former    Current packs/day: 0.00    Average packs/day: 1 pack/day for 12.0 years (12.0 ttl pk-yrs)    Types: Cigarettes    Start date: 04/03/1958    Quit date: 04/03/1970    Years since quitting: 53.0   Smokeless tobacco: Never  Vaping Use   Vaping status: Never Used  Substance and Sexual Activity   Alcohol use: No   Drug use: No   Sexual activity: Not Currently  Other Topics Concern   Not on file  Social History Narrative   Lives with wife on a farm.     Social  Drivers of Health   Financial Resource Strain: Low Risk  (04/26/2023)   Overall Financial Resource Strain (CARDIA)    Difficulty of Paying Living Expenses: Not hard at all  Food Insecurity: No Food Insecurity (04/26/2023)   Hunger Vital Sign    Worried About Running Out of Food in the Last Year: Never true    Ran Out of Food in the Last Year: Never true  Transportation Needs: No Transportation Needs (04/26/2023)   PRAPARE - Administrator, Civil Service (Medical): No    Lack of Transportation (Non-Medical): No  Physical Activity: Sufficiently Active (04/26/2023)   Exercise Vital Sign    Days of Exercise per Week: 3 days    Minutes of Exercise per Session: 60 min  Stress: No Stress Concern Present (04/26/2023)   [employer] of Occupational Health - Occupational Stress Questionnaire    Feeling of Stress : Not at all  Social Connections: Socially Integrated (04/26/2023)   Social Connection and Isolation Panel [NHANES]    Frequency of Communication with Friends and Family: More than three times a week    Frequency of Social Gatherings with Friends and Family: Three times a week    Attends Religious Services: More than 4 times per year    Active Member of Clubs or Organizations: Yes    Attends Engineer, structural: More than 4 times per year    Marital Status: Married    Tobacco  Counseling Counseling given: Not Answered   Clinical Intake:  Pre-visit preparation completed: Yes  Pain : No/denies pain     Diabetes: Yes CBG done?: No Did pt. bring in CBG monitor from home?: No  How often do you need to have someone help you when you read instructions, pamphlets, or other written materials from your doctor or pharmacy?: 1 - Never  Interpreter Needed?: No  Information entered by :: Kandis Fantasia LPN   Activities of Daily Living    04/24/2023   12:58 PM  In your present state of health, do you have any difficulty performing the following activities:  Hearing? 1  Vision? 0  Difficulty concentrating or making decisions? 0  Walking or climbing stairs? 0  Dressing or bathing? 0  Doing errands, shopping? 0  Preparing Food and eating ? N  Using the Toilet? N  In the past six months, have you accidently leaked urine? N  Do you have problems with loss of bowel control? N  Managing your Medications? N  Managing your Finances? N  Housekeeping or managing your Housekeeping? N    Patient Care Team: Dettinger, Elige Radon, MD as PCP - General (Family Medicine) Theodosia Blender, MD as Referring Physician (Urology) Rollene Rotunda, MD as Consulting Physician (Cardiology) Elwin Mocha, MD as Consulting Physician (Ophthalmology) Charlean Sanfilippo, Feliberto Gottron, MD as Referring Physician (Gastroenterology) Eldred Manges, MD as Consulting Physician (Orthopedic Surgery) Danella Maiers, [facility] as Pharmacist (Family Medicine)  Indicate any recent Medical Services you may have received from other than [facility] providers in the past year (date may be approximate).     Assessment:   This is a routine wellness examination for [place].  Hearing/Vision screen Hearing Screening - Comments:: Denies hearing difficulties   Vision Screening - Comments:: Wears rx glasses - up to date with routine eye exams with [facility]     Goals Addressed             This  Visit's Progress    COMPLETED: AWV  04/05/2020 AWV Goal: Fall Prevention  Over the next year, patient will decrease their risk for falls by: Using assistive devices, such as a cane or walker, as needed Identifying fall risks within their home and correcting them by: Removing throw rugs Adding handrails to stairs or ramps Removing clutter and keeping a clear pathway throughout the home Increasing light, especially at night Adding shower handles/bars Raising toilet seat Identifying potential personal risk factors for falls: Medication side effects Incontinence/urgency Vestibular dysfunction Hearing loss Musculoskeletal disorders Neurological disorders Orthostatic hypotension       Remain active and independent        Depression Screen    04/26/2023   12:02 PM 03/15/2023   11:16 AM 03/15/2023   11:01 AM 03/15/2023   11:00 AM 07/21/2022   10:06 AM 04/24/2022   11:38 AM 04/21/2022   10:25 AM  PHQ 2/9 Scores  PHQ - 2 Score 0 0 0 0 0 0 0  PHQ- 9 Score 0 0 0   0 0    Fall Risk    04/24/2023   12:58 PM 03/15/2023   11:16 AM 03/15/2023   11:00 AM 07/21/2022   10:06 AM 04/24/2022   11:33 AM  Fall Risk   Falls in the past year? 0 0  0 0  Number falls in past yr: 0 0 0  0  Injury with Fall? 0 0 0  0  Risk for fall due to : No Fall Risks History of fall(s) No Fall Risks  No Fall Risks  Follow up Falls prevention discussed;Education provided;Falls evaluation completed Falls evaluation completed Falls evaluation completed  Falls prevention discussed    MEDICARE RISK AT HOME: Medicare Risk at Home Any stairs in or around the home?: (Patient-Rptd) Yes If so, are there any without handrails?: (Patient-Rptd) No Home free of loose throw rugs in walkways, pet beds, electrical cords, etc?: (Patient-Rptd) Yes Adequate lighting in your home to reduce risk of falls?: (Patient-Rptd) Yes Life alert?: (Patient-Rptd) No Use of a cane, walker or w/c?: (Patient-Rptd) No Grab bars in the  bathroom?: (Patient-Rptd) No Shower chair or bench in shower?: (Patient-Rptd) No Elevated toilet seat or a handicapped toilet?: (Patient-Rptd) No  TIMED UP AND GO:  Was the test performed?  No    Cognitive Function:    03/21/2017    5:01 PM 03/08/2016    3:53 PM 01/01/2015   11:11 AM  MMSE - Mini Mental State Exam  Orientation to time 5 5 5   Orientation to Place 5 5 5   Registration 3 3 3   Attention/ Calculation 5 5 5   Recall 3 2 1   Language- name 2 objects 2 2 2   Language- repeat 1 1 1   Language- follow 3 step command 3 3 3   Language- read & follow direction 1 1 1   Write a sentence 1 1 1   Copy design 1 1 1   Total score 30 29 28         04/26/2023   12:04 PM 04/24/2022   11:41 AM 04/06/2021   11:25 AM 04/05/2020   11:44 AM  6CIT Screen  What Year? 0 points 0 points 0 points 0 points  What month? 0 points 0 points 0 points 0 points  What time? 0 points 0 points 0 points 0 points  Count back from 20 0 points 0 points 0 points 0 points  Months in reverse 0 points 0 points 0 points 0 points  Repeat phrase 0 points 0 points 2 points 4 points  Total Score 0 points 0 points 2 points 4 points    Immunizations Immunization History  Administered Date(s) Administered   Fluad Quad(high Dose 65+) 02/02/2019, 12/31/2019, 12/29/2020, 01/16/2022   Fluad Trivalent(High Dose 65+) 03/15/2023   Influenza, High Dose Seasonal PF 01/15/2017, 03/13/2018   Influenza,inj,Quad PF,6+ Mos 01/28/2013, 01/28/2014, 01/11/2015   Moderna SARS-COV2 Booster Vaccination 04/28/2020   Moderna Sars-Covid-2 Vaccination 05/05/2019, 06/02/2019   Pneumococcal Conjugate-13 01/28/2013   Pneumococcal Polysaccharide-23 01/01/2015   Tdap 09/27/2020   Zoster Recombinant(Shingrix) 03/21/2017, 09/06/2017   Zoster, Live 01/09/2013    TDAP status: Up to date  Flu Vaccine status: Up to date  Pneumococcal vaccine status: Up to date  Covid-19 vaccine status: Information provided on how to obtain vaccines.    Qualifies for Shingles Vaccine? Yes   Zostavax completed Yes   Shingrix Completed?: Yes  Screening Tests Health Maintenance  Topic Date Due   COVID-19 Vaccine (3 - Moderna risk series) 05/26/2020   Diabetic kidney evaluation - Urine ACR  04/22/2023   FOOT EXAM  07/21/2023   OPHTHALMOLOGY EXAM  08/01/2023   HEMOGLOBIN A1C  09/13/2023   Diabetic kidney evaluation - eGFR measurement  03/14/2024   Medicare Annual Wellness (AWV)  04/25/2024   Colonoscopy  03/24/2027   DTaP/Tdap/Td (2 - Td or Tdap) 09/28/2030   Pneumonia Vaccine 56+ Years old  Completed   INFLUENZA VACCINE  Completed   Hepatitis C Screening  Completed   Zoster Vaccines- Shingrix  Completed   HPV VACCINES  Aged Out    Health Maintenance  Health Maintenance Due  Topic Date Due   COVID-19 Vaccine (3 - Moderna risk series) 05/26/2020   Diabetic kidney evaluation - Urine ACR  04/22/2023    Colorectal cancer screening: Type of screening: Colonoscopy. Completed 03/23/22. Repeat every 5 years  Lung Cancer Screening: (Low Dose CT Chest recommended if Age 20-80 years, 20 pack-year currently smoking OR have quit w/in 15years.) does not qualify.   Lung Cancer Screening Referral: n/a  Additional Screening:  Hepatitis C Screening: does qualify; Completed 09/29/15  Vision Screening: Recommended annual ophthalmology exams for early detection of glaucoma and other disorders of the eye. Is the patient up to date with their annual eye exam?  Yes  Who is the provider or what is the name of the office in which the patient attends annual eye exams? [facility]  If pt is not established with a provider, would they like to be referred to a provider to establish care? No .   Dental Screening: Recommended annual dental exams for proper oral hygiene  Diabetic Foot Exam: Diabetic Foot Exam: Completed 07/21/22  Community Resource Referral / Chronic Care Management: CRR required this visit?  No   CCM required this  visit?  No     Plan:     I have personally reviewed and noted the following in the patient's chart:   Medical and social history Use of alcohol, tobacco or illicit drugs  Current medications and supplements including opioid prescriptions. Patient is not currently taking opioid prescriptions. Functional ability and status Nutritional status Physical activity Advanced directives List of other physicians Hospitalizations, surgeries, and ER visits in previous 12 months Vitals Screenings to include cognitive, depression, and falls Referrals and appointments  In addition, I have reviewed and discussed with patient certain preventive protocols, quality metrics, and best practice recommendations. A written personalized care plan for preventive services as well as general preventive health recommendations were provided to patient.     Kandis Fantasia  Amie Critchley, LPN   [id number]   After Visit Summary: (MyChart) Due to this being a telephonic visit, the after visit summary with patients personalized plan was offered to patient via MyChart   Nurse Notes: No concerns at this time

## 2023-05-01 ENCOUNTER — Other Ambulatory Visit: Payer: Self-pay | Admitting: Family Medicine

## 2023-05-08 DIAGNOSIS — E113293 Type 2 diabetes mellitus with mild nonproliferative diabetic retinopathy without macular edema, bilateral: Secondary | ICD-10-CM | POA: Diagnosis not present

## 2023-05-08 LAB — OPHTHALMOLOGY REPORT-SCANNED

## 2023-06-19 IMAGING — CT CT L SPINE W/O CM
3 series · 13 of 33 positions shown, 16 images · non-contrast
Comparison: MRI lumbar spine 11/02/2020

CLINICAL DATA: Chronic back pain



[Series 5: l spine 2.0 st · axial · 0.28mm/px · z∈[+880,+1030]mm · 5 of 109 slices shown, 7 images]
[im 17/109  soft-tissue]
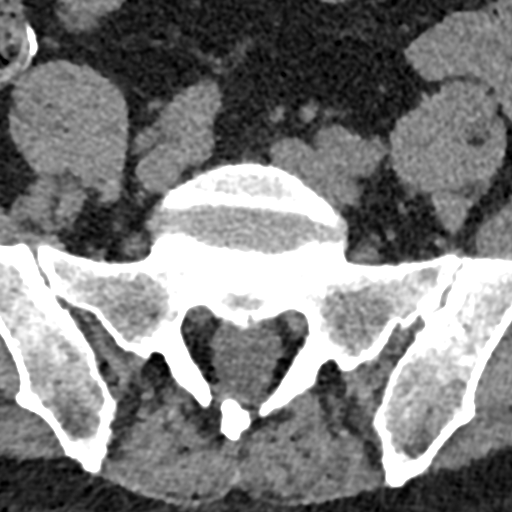
[im 17/109  bone]
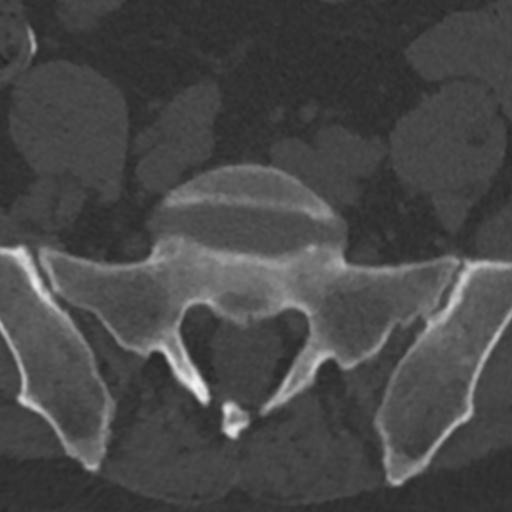
[im 34/109  bone]
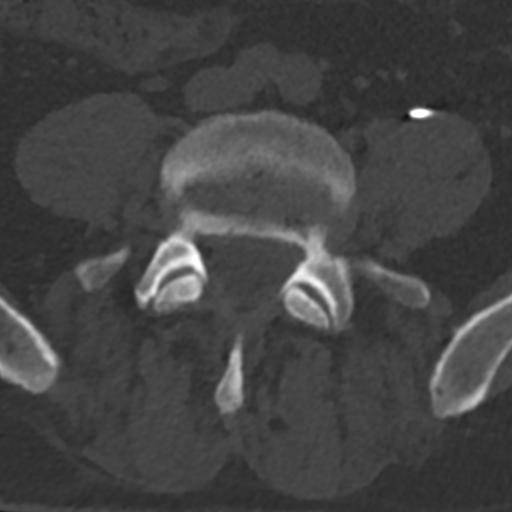
[im 59/109  bone]
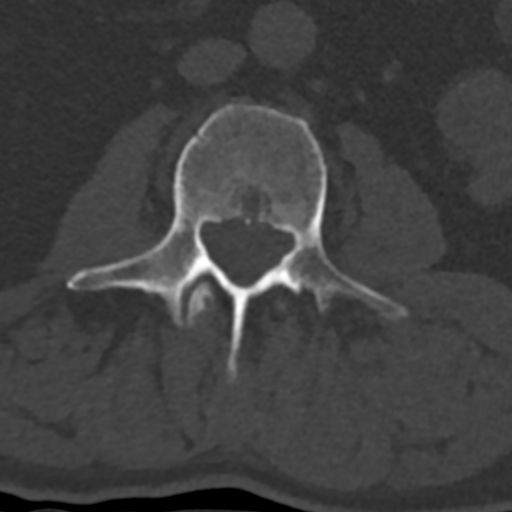
[im 75/109  bone]
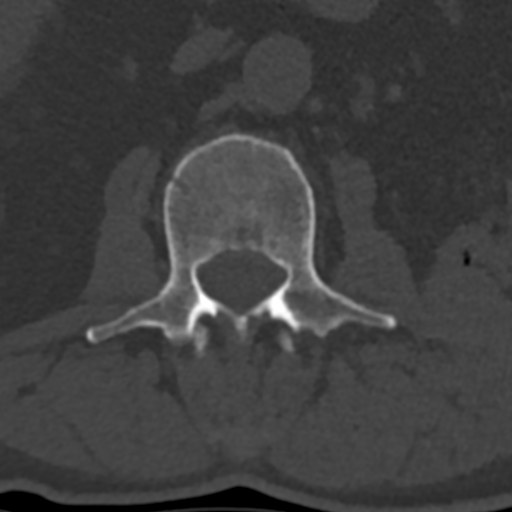
[im 92/109  soft-tissue]
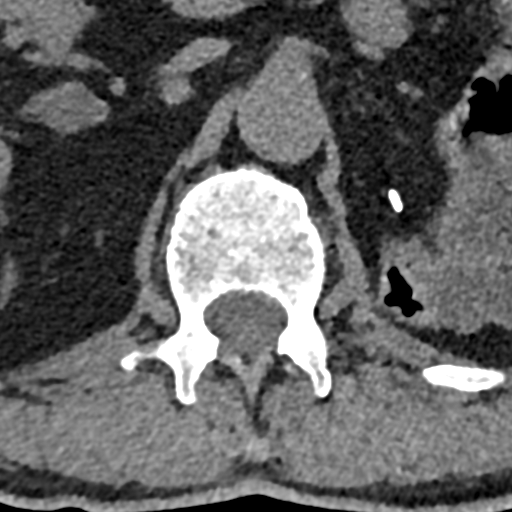
[im 92/109  bone]
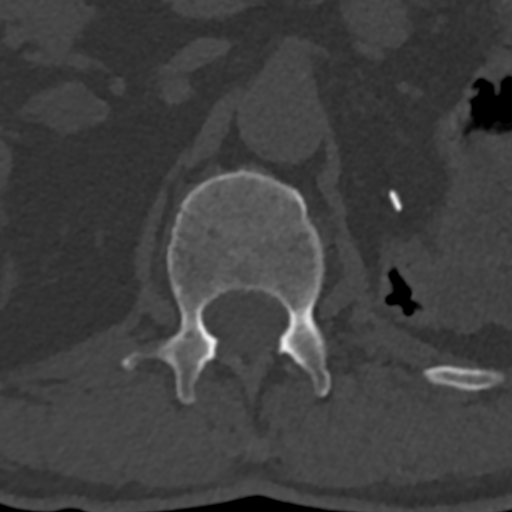

[Series 8: coronal st · coronal · 0.32mm/px · 3 of 67 slices shown]
[im 14/67  bone]
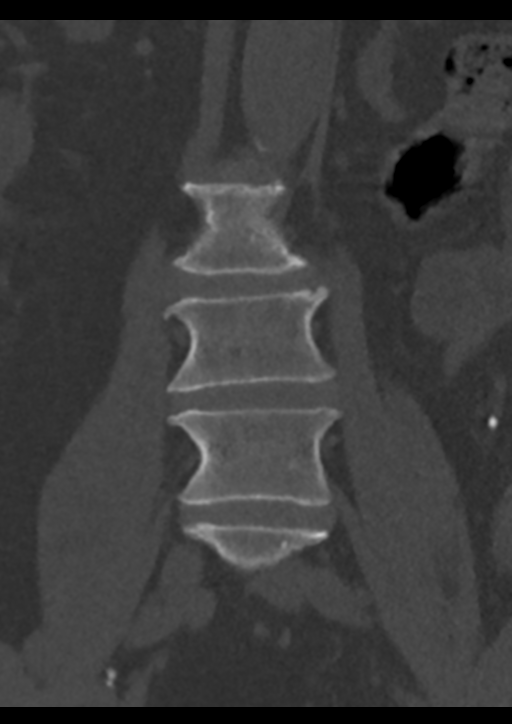
[im 27/67  bone]
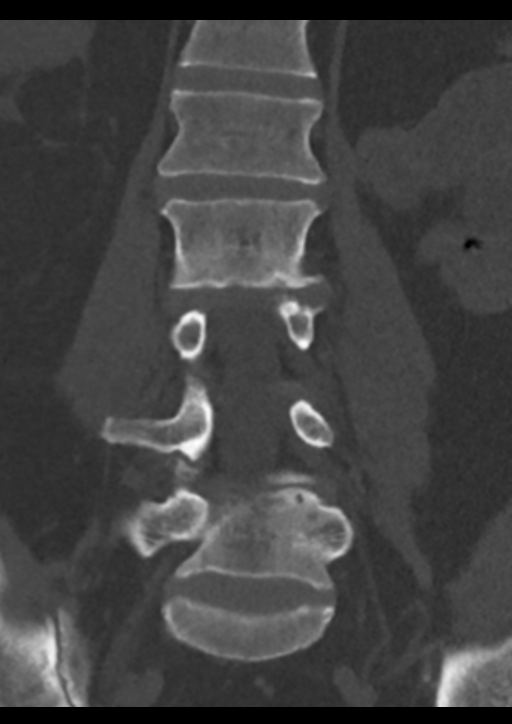
[im 40/67  bone]
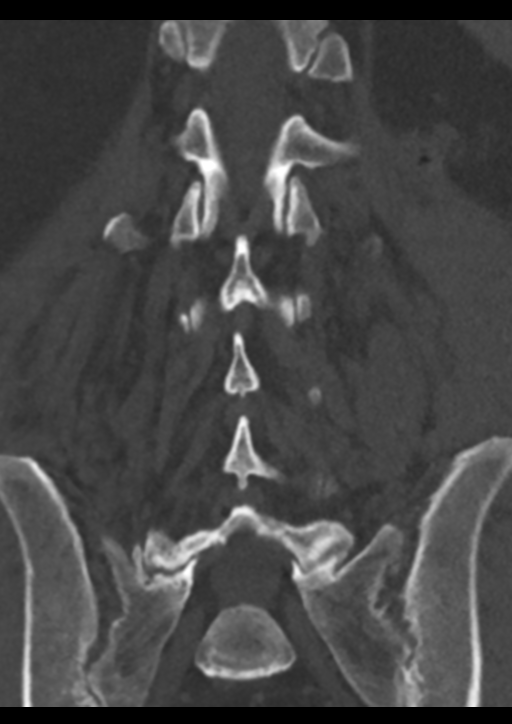

[Series 9: sagittal st · sagittal · 0.32mm/px · 5 of 69 slices shown, 6 images]
[im 23/69  bone]
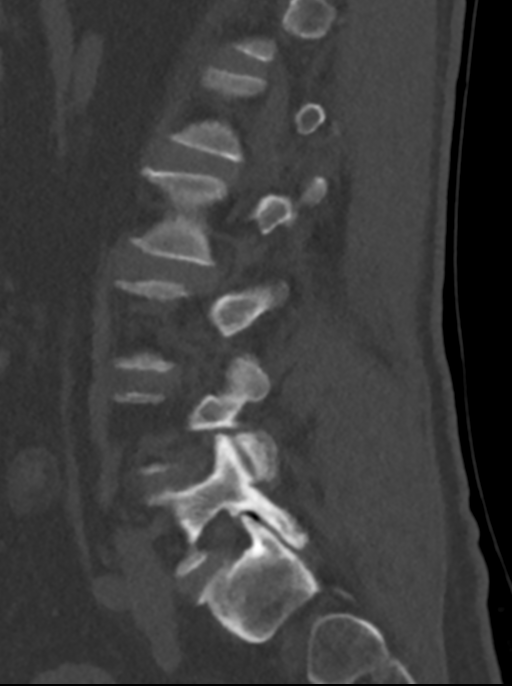
[im 29/69  bone]
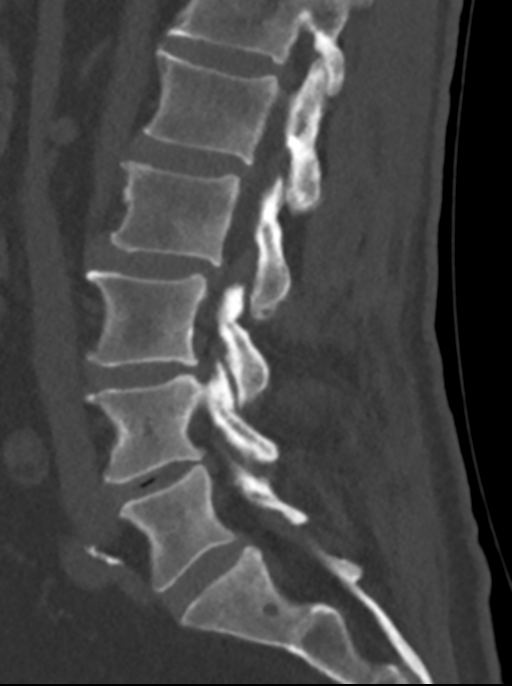
[im 35/69  soft-tissue]
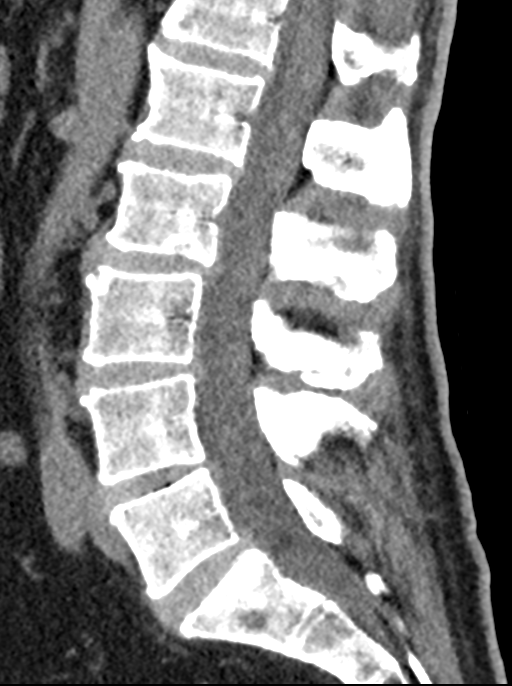
[im 35/69  bone]
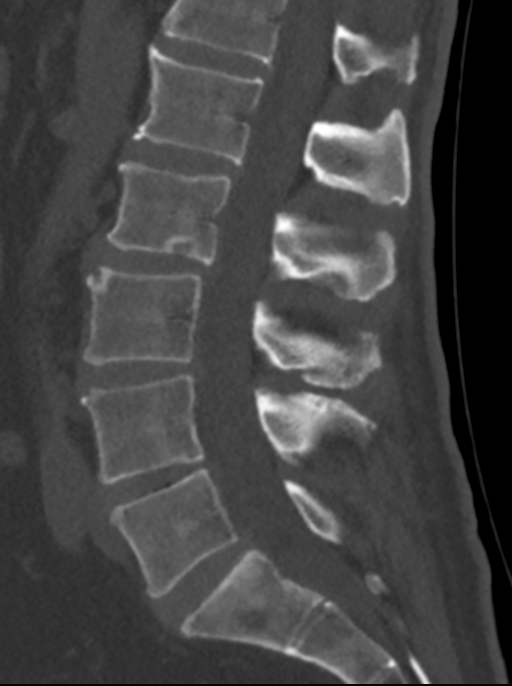
[im 40/69  bone]
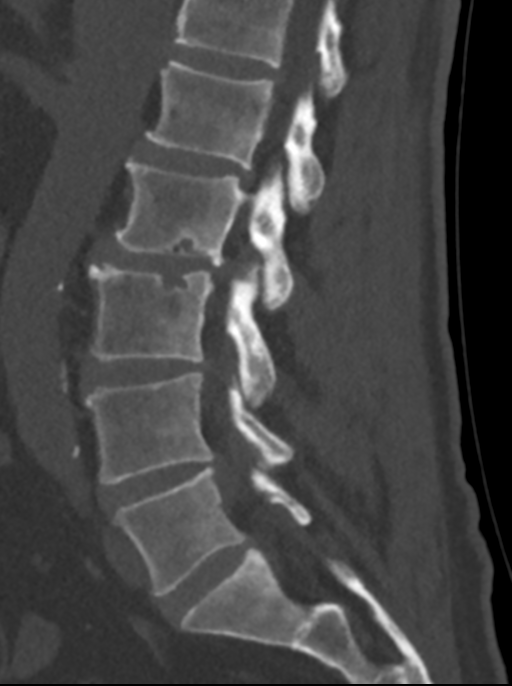
[im 46/69  bone]
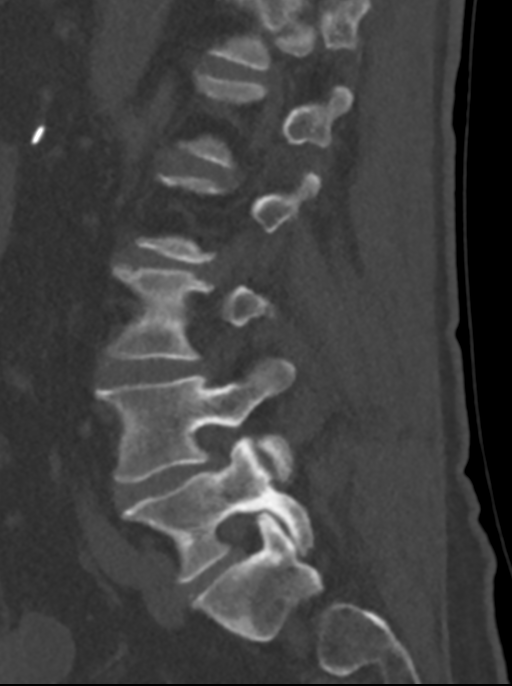

[13 of 33 positions shown; findings below may reference images not displayed]

FINDINGS: Segmentation: 5 lumbar type vertebrae.

Alignment: Stable grade 1 retrolisthesis of L2 on L3.

Vertebrae: No acute fracture or focal pathologic process.

Paraspinal and other soft tissues: No acute process identified. Left
nephrectomy changes partially seen.

Disc levels: Limited evaluation with CT. Mild to moderate disc bulge
at L2-L3 and mild disc bulging at L3-L4 and L4-L5, likely similar to
previous MRI. Facet arthropathy worse on the right and most
significant at L4-L5 and L5-S1.
IMPRESSION: No acute osseous abnormality identified. Grossly stable degenerative
changes.

## 2023-06-20 ENCOUNTER — Encounter: Payer: Self-pay | Admitting: Family Medicine

## 2023-06-20 ENCOUNTER — Ambulatory Visit: Payer: Medicare Other | Admitting: Family Medicine

## 2023-06-20 VITALS — BP 145/65 | HR 60 | Ht 67.0 in | Wt 184.0 lb

## 2023-06-20 DIAGNOSIS — E1159 Type 2 diabetes mellitus with other circulatory complications: Secondary | ICD-10-CM

## 2023-06-20 DIAGNOSIS — I152 Hypertension secondary to endocrine disorders: Secondary | ICD-10-CM | POA: Diagnosis not present

## 2023-06-20 DIAGNOSIS — R3912 Poor urinary stream: Secondary | ICD-10-CM | POA: Diagnosis not present

## 2023-06-20 DIAGNOSIS — E1169 Type 2 diabetes mellitus with other specified complication: Secondary | ICD-10-CM

## 2023-06-20 DIAGNOSIS — Z7984 Long term (current) use of oral hypoglycemic drugs: Secondary | ICD-10-CM | POA: Diagnosis not present

## 2023-06-20 DIAGNOSIS — N401 Enlarged prostate with lower urinary tract symptoms: Secondary | ICD-10-CM | POA: Diagnosis not present

## 2023-06-20 DIAGNOSIS — E785 Hyperlipidemia, unspecified: Secondary | ICD-10-CM | POA: Diagnosis not present

## 2023-06-20 LAB — BAYER DCA HB A1C WAIVED: HB A1C (BAYER DCA - WAIVED): 6.6 % — ABNORMAL HIGH (ref 4.8–5.6)

## 2023-06-20 MED ORDER — IRBESARTAN 300 MG PO TABS: 300.0000 mg | ORAL_TABLET | Freq: Every day | ORAL | 3 refills | Status: AC

## 2023-06-20 MED ORDER — PRAVASTATIN SODIUM 20 MG PO TABS
20.0000 mg | ORAL_TABLET | Freq: Every day | ORAL | 3 refills | Status: AC
Start: 2023-06-20 — End: ?

## 2023-06-20 MED ORDER — TAMSULOSIN HCL 0.4 MG PO CAPS
0.4000 mg | ORAL_CAPSULE | Freq: Every day | ORAL | 3 refills | Status: AC
Start: 2023-06-20 — End: ?

## 2023-06-20 NOTE — Progress Notes (Signed)
 BP (!) 145/65   Pulse 60   Ht 5\' 7"  (1.702 m)   Wt 184 lb (83.5 kg)   SpO2 97%   BMI 28.82 kg/m    Subjective:   Patient ID: Jeffrey Rubio, male    DOB: 05/08/1945, 78 y.o.   MRN: [id number]  HPI: Jeffrey Rubio is a 78 y.o. male presenting on 06/20/2023 for Medical Management of Chronic Issues, Diabetes, and Hypertension   HPI Type 2 diabetes mellitus Patient comes in today for recheck of his diabetes. Patient has been currently taking [place]. Patient is currently on an ACE inhibitor/ARB. Patient has not seen an ophthalmologist this year. Patient denies any new issues with their feet. The symptom started onset as an adult htn and hld ARE RELATED TO DM   Hypertension Patient is currently on amlodipine and irbesartan, and their blood pressure today is 145/65. Patient denies any lightheadedness or dizziness. Patient denies headaches, blurred vision, chest pains, shortness of breath, or weakness. Denies any side effects from medication and is content with current medication.   Hyperlipidemia Patient is coming in for recheck of his hyperlipidemia. The patient is currently taking pravastatin. They deny any issues with myalgias or history of liver damage from it. They deny any focal numbness or weakness or chest pain.   BPH Patient is coming in for recheck on BPH Symptoms: Urinary frequency but no hesitancy or retention Medication: Flomax Last PSA: 3 months ago, normal  Relevant past medical, surgical, family and social history reviewed and updated as indicated. Interim medical history since our last visit reviewed. Allergies and medications reviewed and updated.  Review of Systems  Constitutional:  Negative for chills and fever.  Eyes:  Negative for visual disturbance.  Respiratory:  Negative for shortness of breath and wheezing.   Cardiovascular:  Negative for chest pain and leg swelling.  Musculoskeletal:  Negative for back pain and gait problem.  Skin:   Negative for rash.  Neurological:  Negative for dizziness and light-headedness.  All other systems reviewed and are negative.   Per HPI unless specifically indicated above   Allergies as of 06/20/2023       Reactions   Farxiga [dapagliflozin] Other (See Comments)   Reports yeast in the groin upon initiation, but tolerating Farxiga now & no concerns   Hydrocodone-acetaminophen    Causes Anxiety    Ozempic (0.25 Or 0.5 Mg-dose) [semaglutide(0.25 Or 0.5mg -dos)] Other (See Comments)   Pancreatic insufficiency, GI--diarrhea        Medication List        Accurate as of June 20, 2023  9:53 AM. If you have any questions, ask your nurse or doctor.          Accu-Chek Aviva Plus w/Device Kit USE to check blood sugars daily   amLODipine 5 MG tablet Commonly known as: NORVASC Take 1 tablet (5 mg total) by mouth daily.   aspirin 81 MG tablet Take 81 mg by mouth daily.   b complex vitamins capsule Take 1 capsule by mouth daily.   BD Pen Needle Nano 2nd Gen 32G X 4 MM Misc Generic drug: Insulin Pen Needle Use w/ Evaristo Bury Dx E11.9   dapagliflozin propanediol 10 MG Tabs tablet Commonly known as: Farxiga Take 1 tablet (10 mg total) by mouth daily before breakfast.   Fish Oil 1000 MG Caps Take 1,000 mg by mouth in the morning and at bedtime.   FreeStyle Libre 3 Plus Sensor Misc 1 each by Does not apply  route See admin instructions. Place 1 sensor on back of arm every 15 days. DX: E11.65   irbesartan 300 MG tablet Commonly known as: AVAPRO Take 1 tablet (300 mg total) by mouth daily.   meclizine 25 MG tablet Commonly known as: ANTIVERT Take 1 tablet (25 mg total) by mouth 3 (three) times daily as needed for dizziness.   Metamucil 28.3 % Powd Generic drug: Psyllium Take 3 Scoops by mouth daily.   pravastatin 20 MG tablet Commonly known as: PRAVACHOL Take 1 tablet (20 mg total) by mouth daily.   PROBIOTIC-10 PO Take 1 capsule by mouth daily.   tamsulosin 0.4 MG  Caps capsule Commonly known as: FLOMAX Take 1 capsule (0.4 mg total) by mouth daily.   Evaristo Bury FlexTouch 100 UNIT/ML FlexTouch Pen Generic drug: insulin degludec Inject 10-20 Units into the skin at bedtime.   Vitamin D3 50 MCG (2000 UT) Tabs Take 4,000 Units by mouth every morning.         Objective:   BP (!) 145/65   Pulse 60   Ht 5\' 7"  (1.702 m)   Wt 184 lb (83.5 kg)   SpO2 97%   BMI 28.82 kg/m   Wt Readings from Last 3 Encounters:  06/20/23 184 lb (83.5 kg)  04/26/23 180 lb (81.6 kg)  03/15/23 180 lb (81.6 kg)    Physical Exam Vitals and nursing note reviewed.  Constitutional:      General: He is not in acute distress.    Appearance: He is well-developed. He is not diaphoretic.  Eyes:     General: No scleral icterus.    Conjunctiva/sclera: Conjunctivae normal.  Neck:     Thyroid: No thyromegaly.  Cardiovascular:     Rate and Rhythm: Normal rate and regular rhythm.     Heart sounds: Normal heart sounds. No murmur heard. Pulmonary:     Effort: Pulmonary effort is normal. No respiratory distress.     Breath sounds: Normal breath sounds. No wheezing.  Musculoskeletal:        General: No swelling. Normal range of motion.     Cervical back: Neck supple.  Lymphadenopathy:     Cervical: No cervical adenopathy.  Skin:    General: Skin is warm and dry.     Findings: No rash.  Neurological:     Mental Status: He is alert and oriented to person, place, and time.     Coordination: Coordination normal.  Psychiatric:        Behavior: Behavior normal.       Assessment & Plan:   Problem List Items Addressed This Visit       Cardiovascular and Mediastinum   Hypertension associated with diabetes (HCC)   Relevant Medications   irbesartan (AVAPRO) 300 MG tablet   pravastatin (PRAVACHOL) 20 MG tablet     Endocrine   Hyperlipidemia associated with type 2 diabetes mellitus (HCC)   Relevant Medications   irbesartan (AVAPRO) 300 MG tablet   pravastatin  (PRAVACHOL) 20 MG tablet   DM (diabetes mellitus) (HCC) - Primary   Relevant Medications   irbesartan (AVAPRO) 300 MG tablet   pravastatin (PRAVACHOL) 20 MG tablet   Other Relevant Orders   Bayer DCA Hb A1c Waived     Genitourinary   Benign prostatic hyperplasia   Relevant Medications   tamsulosin (FLOMAX) 0.4 MG CAPS capsule    Continue current medicine, A1c looks good at 6.6.  No changes Follow up plan: Return in about 3 months (around 09/20/2023), or if symptoms  worsen or fail to improve, for diabetes.  Counseling provided for all of the vaccine components Orders Placed This Encounter  Procedures   Bayer DCA Hb A1c Waived    Arville Care, MD [facility] 06/20/2023, 9:53 AM

## 2023-07-25 ENCOUNTER — Telehealth: Payer: Self-pay

## 2023-07-25 DIAGNOSIS — E1169 Type 2 diabetes mellitus with other specified complication: Secondary | ICD-10-CM

## 2023-07-25 MED ORDER — DAPAGLIFLOZIN PROPANEDIOL 10 MG PO TABS: 10.0000 mg | ORAL_TABLET | Freq: Every day | ORAL | 4 refills | Status: DC

## 2023-07-25 NOTE — Telephone Encounter (Signed)
 Rec'd refill request for patients Farxiga  medication for [name] patient assistance.   Please send refills to [facility]. Thanks!

## 2023-07-25 NOTE — Telephone Encounter (Signed)
 done

## 2023-10-04 ENCOUNTER — Other Ambulatory Visit (INDEPENDENT_AMBULATORY_CARE_PROVIDER_SITE_OTHER): Admitting: Pharmacist

## 2023-10-04 ENCOUNTER — Encounter: Payer: Self-pay | Admitting: Family Medicine

## 2023-10-04 ENCOUNTER — Ambulatory Visit: Admitting: Family Medicine

## 2023-10-04 VITALS — BP 131/70 | HR 59 | Ht 67.0 in | Wt 182.0 lb

## 2023-10-04 DIAGNOSIS — Z7984 Long term (current) use of oral hypoglycemic drugs: Secondary | ICD-10-CM | POA: Diagnosis not present

## 2023-10-04 DIAGNOSIS — I152 Hypertension secondary to endocrine disorders: Secondary | ICD-10-CM

## 2023-10-04 DIAGNOSIS — E785 Hyperlipidemia, unspecified: Secondary | ICD-10-CM

## 2023-10-04 DIAGNOSIS — E1169 Type 2 diabetes mellitus with other specified complication: Secondary | ICD-10-CM | POA: Diagnosis not present

## 2023-10-04 DIAGNOSIS — Z7985 Long-term (current) use of injectable non-insulin antidiabetic drugs: Secondary | ICD-10-CM

## 2023-10-04 DIAGNOSIS — E119 Type 2 diabetes mellitus without complications: Secondary | ICD-10-CM

## 2023-10-04 DIAGNOSIS — E1159 Type 2 diabetes mellitus with other circulatory complications: Secondary | ICD-10-CM

## 2023-10-04 LAB — LIPID PANEL

## 2023-10-04 LAB — BAYER DCA HB A1C WAIVED: HB A1C (BAYER DCA - WAIVED): 6.8 % — ABNORMAL HIGH (ref 4.8–5.6)

## 2023-10-04 NOTE — Progress Notes (Signed)
 BP 131/70   Pulse (!) 59   Ht 5' 7 (1.702 m)   Wt 182 lb (82.6 kg)   SpO2 95%   BMI 28.51 kg/m    Subjective:   Patient ID: Jeffrey Rubio, male    DOB: [birth year], 78 y.o.   MRN: [id number]  HPI: Jeffrey Rubio is a 78 y.o. male presenting on 10/04/2023 for Medical Management of Chronic Issues, Diabetes, and Hypertension   HPI Type 2 diabetes mellitus Patient comes in today for recheck of his diabetes. Patient has been currently taking Farxiga  and Tresiba . Patient is currently on an ACE inhibitor/ARB. Patient has not seen an ophthalmologist this year. Patient denies any new issues with their feet. The symptom started onset as an adult hypertension and hyperlipidemia ARE RELATED TO DM   Hypertension Patient is currently on amlodipine  and irbesartan , and their blood pressure today is 131/70. Patient denies any lightheadedness or dizziness. Patient denies headaches, blurred vision, chest pains, shortness of breath, or weakness. Denies any side effects from medication and is content with current medication.   Hyperlipidemia Patient is coming in for recheck of his hyperlipidemia. The patient is currently taking pravastatin  and fish oils. They deny any issues with myalgias or history of liver damage from it. They deny any focal numbness or weakness or chest pain.   Relevant past medical, surgical, family and social history reviewed and updated as indicated. Interim medical history since our last visit reviewed. Allergies and medications reviewed and updated.  Review of Systems  Constitutional:  Negative for chills and fever.  Eyes:  Negative for visual disturbance.  Respiratory:  Negative for shortness of breath and wheezing.   Cardiovascular:  Negative for chest pain and leg swelling.  Musculoskeletal:  Negative for back pain and gait problem.  Skin:  Negative for rash.  Neurological:  Negative for dizziness and light-headedness.  All other systems reviewed and are  negative.   Per HPI unless specifically indicated above   Allergies as of 10/04/2023       Reactions   Farxiga  [dapagliflozin ] Other (See Comments)   Reports yeast in the groin upon initiation, but tolerating Farxiga  now & no concerns   Hydrocodone -acetaminophen     Causes Anxiety    Ozempic  (0.25 Or 0.5 Mg-dose) [semaglutide (0.25 Or 0.5mg -dos)] Other (See Comments)   Pancreatic insufficiency, GI--diarrhea        Medication List        Accurate as of October 04, 2023 10:29 AM. If you have any questions, ask your nurse or doctor.          Accu-Chek Aviva Plus w/Device Kit USE to check blood sugars daily   amLODipine  5 MG tablet Commonly known as: NORVASC  Take 1 tablet (5 mg total) by mouth daily.   aspirin  81 MG tablet Take 81 mg by mouth daily.   b complex vitamins capsule Take 1 capsule by mouth daily.   BD Pen Needle Nano 2nd Gen 32G X 4 MM Misc Generic drug: Insulin  Pen Needle Use w/ Tresiba  Dx E11.9   dapagliflozin  propanediol 10 MG Tabs tablet Commonly known as: Farxiga  Take 1 tablet (10 mg total) by mouth daily before breakfast.   Fish Oil 1000 MG Caps Take 1,000 mg by mouth in the morning and at bedtime.   FreeStyle Libre 3 Plus Sensor Misc 1 each by Does not apply route See admin instructions. Place 1 sensor on back of arm every 15 days. DX: E11.65   irbesartan  300 MG tablet Commonly known  as: AVAPRO  Take 1 tablet (300 mg total) by mouth daily.   meclizine  25 MG tablet Commonly known as: ANTIVERT  Take 1 tablet (25 mg total) by mouth 3 (three) times daily as needed for dizziness.   Metamucil 28.3 % Powd Generic drug: Psyllium Take 3 Scoops by mouth daily.   pravastatin  20 MG tablet Commonly known as: PRAVACHOL  Take 1 tablet (20 mg total) by mouth daily.   PROBIOTIC-10 PO Take 1 capsule by mouth daily.   tamsulosin  0.4 MG Caps capsule Commonly known as: FLOMAX  Take 1 capsule (0.4 mg total) by mouth daily.   Tresiba  FlexTouch 100 UNIT/ML  FlexTouch Pen Generic drug: insulin  degludec Inject 10-20 Units into the skin at bedtime.   Vitamin D3 50 MCG (2000 UT) Tabs Take 4,000 Units by mouth every morning.         Objective:   BP 131/70   Pulse (!) 59   Ht 5' 7 (1.702 m)   Wt 182 lb (82.6 kg)   SpO2 95%   BMI 28.51 kg/m   Wt Readings from Last 3 Encounters:  10/04/23 182 lb (82.6 kg)  06/20/23 184 lb (83.5 kg)  04/26/23 180 lb (81.6 kg)    Physical Exam Vitals and nursing note reviewed.  Constitutional:      General: He is not in acute distress.    Appearance: He is well-developed. He is not diaphoretic.  Eyes:     General: No scleral icterus.       Right eye: No discharge.     Conjunctiva/sclera: Conjunctivae normal.  Neck:     Thyroid : No thyromegaly.  Cardiovascular:     Rate and Rhythm: Normal rate and regular rhythm.     Heart sounds: Normal heart sounds. No murmur heard. Pulmonary:     Effort: Pulmonary effort is normal. No respiratory distress.     Breath sounds: Normal breath sounds. No wheezing.  Musculoskeletal:        General: No swelling. Normal range of motion.     Cervical back: Neck supple.  Lymphadenopathy:     Cervical: No cervical adenopathy.  Skin:    General: Skin is warm and dry.     Findings: No rash.  Neurological:     Mental Status: He is alert and oriented to person, place, and time.     Coordination: Coordination normal.  Psychiatric:        Behavior: Behavior normal.       Assessment & Plan:   Problem List Items Addressed This Visit       Cardiovascular and Mediastinum   Hypertension associated with diabetes (HCC) - Primary   Relevant Orders   Bayer DCA Hb A1c Waived   CBC with Differential/Platelet   CMP14+EGFR   Lipid panel     Endocrine   Hyperlipidemia associated with type 2 diabetes mellitus (HCC)   Relevant Orders   Bayer DCA Hb A1c Waived   CBC with Differential/Platelet   CMP14+EGFR   Lipid panel   DM (diabetes mellitus) (HCC)   Relevant  Orders   Microalbumin/Creatinine Ratio, Urine    Will do blood work and urine on the way out today. He seems to be doing's.  Well, foot exam looks well blood pressure looks good today. Follow up plan: Return in about 3 months (around 01/04/2024), or if symptoms worsen or fail to improve, for Diabetes hypertension and cholesterol recheck.  Counseling provided for all of the vaccine components Orders Placed This Encounter  Procedures   Bayer [facility]  Hb A1c Waived   CBC with Differential/Platelet   CMP14+EGFR   Lipid panel   Microalbumin/Creatinine Ratio, Urine    Fonda Levins, MD [facility] 10/04/2023, 10:29 AM

## 2023-10-04 NOTE — Progress Notes (Signed)
   10/04/2023 Name: Jeffrey Rubio MRN: [id number] DOB: 21-Apr-1945  Chief Complaint  Patient presents with   Diabetes    Discussed libre 3 PLUS CGM with patient.  He has had 3 come off due to excessive sweating.  Discussed ordering over patches and apply skin tac for longer wear times.  Libre 3 Plus CGM sample given to patient today.  [facility] uses [facility] via [facility] for his CGM.  New orders placed today to [facility] for continuation of therapy.  His diabetes remains well controlled and he is actively enrolled in patient assistance.   Lab Results  Component Value Date   HGBA1C 6.6 (H) 06/20/2023    Lab Results  Component Value Date   CREATININE 1.33 (H) 03/15/2023   BUN 25 03/15/2023   NA 143 03/15/2023   K 4.8 03/15/2023   CL 109 (H) 03/15/2023   CO2 21 03/15/2023    Lab Results  Component Value Date   CHOL 142 03/15/2023   HDL 41 03/15/2023   LDLCALC 67 03/15/2023   TRIG 206 (H) 03/15/2023   CHOLHDL 3.5 03/15/2023     Assessment/Plan:   Continue current regimen Follow Up Plan: f/u with PharmD in the fall to ensure re-enrollment and review diabetes regimen  Graig Hessling Dattero Rosalynd Mcwright, PharmD, BCACP, CPP Clinical Pharmacist, [facility]

## 2023-10-05 LAB — CBC WITH DIFFERENTIAL/PLATELET
Basophils Absolute: 0 x10E3/uL (ref 0.0–0.2)
Basos: 1 %
EOS (ABSOLUTE): 0.1 x10E3/uL (ref 0.0–0.4)
Eos: 1 %
Hematocrit: 46.8 % (ref 37.5–51.0)
Hemoglobin: 15.4 g/dL (ref 13.0–17.7)
Immature Grans (Abs): 0 x10E3/uL (ref 0.0–0.1)
Immature Granulocytes: 0 %
Lymphocytes Absolute: 2.2 x10E3/uL (ref 0.7–3.1)
Lymphs: 30 %
MCH: 30.2 pg (ref 26.6–33.0)
MCHC: 32.9 g/dL (ref 31.5–35.7)
MCV: 92 fL (ref 79–97)
Monocytes Absolute: 0.6 x10E3/uL (ref 0.1–0.9)
Monocytes: 8 %
Neutrophils Absolute: 4.5 x10E3/uL (ref 1.4–7.0)
Neutrophils: 60 %
Platelets: 223 x10E3/uL (ref 150–450)
RBC: 5.1 x10E6/uL (ref 4.14–5.80)
RDW: 14 % (ref 11.6–15.4)
WBC: 7.3 x10E3/uL (ref 3.4–10.8)

## 2023-10-05 LAB — LIPID PANEL
Chol/HDL Ratio: 3.8 ratio (ref 0.0–5.0)
Cholesterol, Total: 185 mg/dL (ref 100–199)
HDL: 49 mg/dL (ref >39)
LDL Chol Calc (NIH): 112 mg/dL — ABNORMAL HIGH (ref 0–99)
Triglycerides: 135 mg/dL (ref 0–149)
VLDL Cholesterol Cal: 24 mg/dL (ref 5–40)

## 2023-10-05 LAB — CMP14+EGFR
ALT: 19 IU/L (ref 0–44)
AST: 16 IU/L (ref 0–40)
Albumin: 4.3 g/dL (ref 3.8–4.8)
Alkaline Phosphatase: 108 IU/L (ref 44–121)
BUN/Creatinine Ratio: 18 (ref 10–24)
BUN: 26 mg/dL (ref 8–27)
Bilirubin Total: 0.4 mg/dL (ref 0.0–1.2)
CO2: 19 mmol/L — ABNORMAL LOW (ref 20–29)
Calcium: 9.5 mg/dL (ref 8.6–10.2)
Chloride: 108 mmol/L — ABNORMAL HIGH (ref 96–106)
Creatinine, Ser: 1.47 mg/dL — ABNORMAL HIGH (ref 0.76–1.27)
Globulin, Total: 2.4 g/dL (ref 1.5–4.5)
Glucose: 111 mg/dL — ABNORMAL HIGH (ref 70–99)
Potassium: 4.9 mmol/L (ref 3.5–5.2)
Sodium: 142 mmol/L (ref 134–144)
Total Protein: 6.7 g/dL (ref 6.0–8.5)
eGFR: 49 mL/min/1.73 — ABNORMAL LOW (ref >59)

## 2023-10-06 LAB — MICROALBUMIN / CREATININE URINE RATIO
Creatinine, Urine: 92.3 mg/dL
Microalb/Creat Ratio: 114 mg/g{creat} — ABNORMAL HIGH (ref 0–29)
Microalbumin, Urine: 105.6 ug/mL

## 2023-10-08 ENCOUNTER — Ambulatory Visit: Payer: Self-pay | Admitting: Family Medicine

## 2023-10-12 ENCOUNTER — Telehealth: Payer: Self-pay | Admitting: Family Medicine

## 2023-10-12 NOTE — Telephone Encounter (Signed)
 Please let Mliss know  Copied from CRM [id number]. Topic: General - Call Back - No Documentation >> Oct 12, 2023  1:57 PM Jeffrey Rubio wrote: Reason for CRM: patient saw the pharmacist on 7/3. Wife stated she received a email/text  from [facility] and wanted to make sure the supplies was sent to the right insurance. She stated [employer] was mentioned in the email instead of medicare. Wife would like a call back to make sure everything can be corrected.   [phone]

## 2023-10-16 NOTE — Telephone Encounter (Signed)
My chart message send to patient

## 2023-12-19 ENCOUNTER — Other Ambulatory Visit

## 2023-12-19 DIAGNOSIS — Z794 Long term (current) use of insulin: Secondary | ICD-10-CM

## 2023-12-19 DIAGNOSIS — E1169 Type 2 diabetes mellitus with other specified complication: Secondary | ICD-10-CM

## 2023-12-19 DIAGNOSIS — Z7985 Long-term (current) use of injectable non-insulin antidiabetic drugs: Secondary | ICD-10-CM

## 2023-12-19 DIAGNOSIS — E119 Type 2 diabetes mellitus without complications: Secondary | ICD-10-CM

## 2023-12-19 NOTE — Progress Notes (Signed)
 12/19/2023 Name: Jeffrey Rubio MRN: [id number] DOB: 1946/01/08  Chief Complaint  Patient presents with   Diabetes   Subjective:  Discussed libre 3 PLUS CGM with patient.  In the past he has had several sensors fall off due to excessive sweating, however he has not had a problem lately.  Patient uses [facility] via [facility] for his CGM.   His diabetes remains well controlled and he is actively enrolled in patient assistance.  Medication Access/Adherence   Patient reports affordability concerns with their medications: Yes ; currently enrolled in PAP Patient reports access/transportation concerns to their pharmacy: No  Patient reports adherence concerns with their medications:  No     Diabetes:  Current medications:  -Tresiba  20 units each morning (increased from 19 units) -Farxiga  10 mg daily  Medications tried in the past:  -Ozempic  (pancreatic insufficiency-d/c'd by GI), -Glimepiride  -Metformin  -Sitagliptin  -Pioglitazone   He uses Libre 3 CGM (via DME mail order--[facility]). He does also use the Accu Check Guide meter system when his CGM is not available.   Current glucose readings: mostly FBG<130; his TID is>70%, 15 hypoglycemia or LOW Occasional lows, but due to not eating/over exertion patterns  Patient reports hypoglycemic s/sx including dizziness, shakiness, sweating. Patient denies hyperglycemic symptoms including polyuria, polydipsia, polyphagia, nocturia, neuropathy, blurred vision.  Current meal patterns:  Trying to eat more steady, healthy plate meals Adding more snacks, 15g carbs when working harder/out  Current physical activity: very active during the day  Current medication access support: [name] PAP farxiga   Macrovascular and Microvascular Risk Reduction:  Statin? yes (pravastatin ); ACEi/ARB? yes (irbesartan ) Last urinary albumin /creatinine ratio:  Lab Results  Component Value Date    MICRALBCREAT 114 (H) 10/04/2023   MICRALBCREAT 122 (H) 04/21/2022   MICRALBCREAT 46 (H) 03/20/2019   MICRALBCREAT 30.8 (H) 12/08/2016   MICRALBCREAT 34.2 (H) 09/29/2015   Last eye exam:  Lab Results  Component Value Date   HMDIABEYEEXA Retinopathy (A) 08/01/2022   Last foot exam: 10/04/2023 Tobacco Use:   Lab Results  Component Value Date   HGBA1C 6.8 (H) 10/04/2023    Lab Results  Component Value Date   CREATININE 1.47 (H) 10/04/2023   BUN 26 10/04/2023   NA 142 10/04/2023   K 4.9 10/04/2023   CL 108 (H) 10/04/2023   CO2 19 (L) 10/04/2023    Lab Results  Component Value Date   CHOL 185 10/04/2023   HDL 49 10/04/2023   LDLCALC 112 (H) 10/04/2023   TRIG 135 10/04/2023   CHOLHDL 3.8 10/04/2023       Medications Reviewed Today     Reviewed by Billee Mliss BIRCH, [facility] (Pharmacist) on 12/19/23 at 0859  Med List Status: <None>   Medication Order Taking? Sig Documenting Provider Last Dose Status Informant  amLODipine  (NORVASC ) 5 MG tablet [id number]  Take 1 tablet (5 mg total) by mouth daily. Dettinger, Fonda LABOR, MD  Active   aspirin  81 MG tablet [id number]  Take 81 mg by mouth daily. [provider]  Active Spouse/Significant Other           Med Note KERRIN, MELISSA R   Mon Jul 25, 2021 12:16 PM) ON HOLD for procedure 07/29/21  b complex vitamins capsule [id number]  Take 1 capsule by mouth daily. [provider]  Active   Blood Glucose Monitoring Suppl (ACCU-CHEK AVIVA PLUS) w/Device KIT [id number]  USE to check blood sugars daily Dettinger, Fonda LABOR, MD  Active Spouse/Significant Other  Cholecalciferol (  VITAMIN D3) 2000 units TABS [id number]  Take 4,000 Units by mouth every morning. [provider]  Active Spouse/Significant Other  Continuous Glucose Sensor (FREESTYLE LIBRE 3 PLUS SENSOR) MISC [id number]  1 each by Does not apply route See admin instructions. Place 1 sensor on back of arm every 15 days. DX: E11.65 Dettinger, Fonda LABOR, MD  Active    dapagliflozin  propanediol (FARXIGA ) 10 MG TABS tablet [id number]  Take 1 tablet (10 mg total) by mouth daily before breakfast. Dettinger, Fonda LABOR, MD  Active   insulin  degludec (TRESIBA  FLEXTOUCH) 100 UNIT/ML FlexTouch Pen [id number]  Inject 10-20 Units into the skin at bedtime. Dettinger, Fonda LABOR, MD  Active   Insulin  Pen Needle (BD PEN NEEDLE NANO 2ND GEN) 32G X 4 MM MISC [id number]  Use w/ Tresiba  Dx E11.9 Dettinger, Fonda LABOR, MD  Active   irbesartan  (AVAPRO ) 300 MG tablet [id number]  Take 1 tablet (300 mg total) by mouth daily. Dettinger, Fonda LABOR, MD  Active   meclizine  (ANTIVERT ) 25 MG tablet [id number]  Take 1 tablet (25 mg total) by mouth 3 (three) times daily as needed for dizziness. Dettinger, Fonda LABOR, MD  Active Spouse/Significant Other  Omega-3 Fatty Acids (FISH OIL) 1000 MG CAPS [id number]  Take 1,000 mg by mouth in the morning and at bedtime. [provider]  Active Spouse/Significant Other  pravastatin  (PRAVACHOL ) 20 MG tablet [id number]  Take 1 tablet (20 mg total) by mouth daily. Dettinger, Fonda LABOR, MD  Active   Probiotic Product (PROBIOTIC-10 PO) [id number] No Take 1 capsule by mouth daily. [provider] Taking Active Spouse/Significant Other  Psyllium (METAMUCIL) 28.3 % POWD [id number]  Take 3 Scoops by mouth daily. [provider]  Active Spouse/Significant Other  tamsulosin  (FLOMAX ) 0.4 MG CAPS capsule [id number]  Take 1 capsule (0.4 mg total) by mouth daily. Dettinger, Fonda LABOR, MD  Active             Assessment/Plan:   Diabetes: - Currently controlled; goal A1c <7%. Cardiorenal risk reduction is optimized.. Blood pressure is at goal <130/80. LDL is not at goal.  - Reviewed long term cardiovascular and renal outcomes of uncontrolled blood sugar. and Reviewed goal A1c, goal fasting, and goal 2 hour post prandial glucose. Recommended to check glucose using CGM  - Patient denies personal or family history of multiple endocrine neoplasia type 2,  medullary thyroid  cancer; personal history of pancreatitis or gallbladder disease., Discussed side effects of gastrointestinal upset/nausea; eating smaller meals, avoiding high-fat foods, and remaining upright after eating may reduce nausea. Discussed that overeating is a major trigger of nausea with this class of medications, as often times patients will start to feel full sooner and may need to decrease portion sizes from what they were previously accustomed to.  Reports some lows occurs with over exertion However is working to eat more consistently & eat snacks to avoid those lows Education of CGM arrows provided Continue current regimen - Meets financial criteria for [name] patient assistance program through 04/02/2024.  We will start the renewal for 2026  -revisit statin/LDL still >100 --has always been on pravastatin , would consider rosuvastatin for better control/lower risk of side effects   Follow Up Plan: 2 months  Mliss Tarry Griffin, PharmD, BCACP, CPP Clinical Pharmacist, [facility]

## 2023-12-31 ENCOUNTER — Telehealth: Payer: Self-pay | Admitting: Pharmacist

## 2023-12-31 MED ORDER — DAPAGLIFLOZIN PROPANEDIOL 10 MG PO TABS: 10.0000 mg | ORAL_TABLET | Freq: Every day | ORAL | 4 refills | Status: AC

## 2023-12-31 NOTE — Telephone Encounter (Signed)
 Patient would like to re-enroll in [name] PAP (farxiga ) for next year.  Okay to send me entire PAP New RX sent to [facility].  Thanks!

## 2024-01-04 ENCOUNTER — Ambulatory Visit: Admitting: Family Medicine

## 2024-01-04 ENCOUNTER — Encounter: Payer: Self-pay | Admitting: Family Medicine

## 2024-01-04 VITALS — BP 138/79 | HR 59 | Temp 97.2°F | Ht 67.0 in | Wt 186.0 lb

## 2024-01-04 DIAGNOSIS — E1159 Type 2 diabetes mellitus with other circulatory complications: Secondary | ICD-10-CM | POA: Diagnosis not present

## 2024-01-04 DIAGNOSIS — Z794 Long term (current) use of insulin: Secondary | ICD-10-CM

## 2024-01-04 DIAGNOSIS — E785 Hyperlipidemia, unspecified: Secondary | ICD-10-CM | POA: Diagnosis not present

## 2024-01-04 DIAGNOSIS — Z23 Encounter for immunization: Secondary | ICD-10-CM

## 2024-01-04 DIAGNOSIS — I152 Hypertension secondary to endocrine disorders: Secondary | ICD-10-CM | POA: Diagnosis not present

## 2024-01-04 DIAGNOSIS — E1169 Type 2 diabetes mellitus with other specified complication: Secondary | ICD-10-CM | POA: Diagnosis not present

## 2024-01-04 LAB — BAYER DCA HB A1C WAIVED: HB A1C (BAYER DCA - WAIVED): 7 % — ABNORMAL HIGH (ref 4.8–5.6)

## 2024-01-04 MED ORDER — AMLODIPINE BESYLATE 5 MG PO TABS: 5.0000 mg | ORAL_TABLET | Freq: Every day | ORAL | 3 refills | Status: AC

## 2024-01-04 MED ORDER — FREESTYLE LIBRE 3 PLUS SENSOR MISC: 1.0000 | Status: AC

## 2024-01-04 MED ORDER — TRESIBA FLEXTOUCH 100 UNIT/ML ~~LOC~~ SOPN: 20.0000 [IU] | PEN_INJECTOR | Freq: Every day | SUBCUTANEOUS | 11 refills | Status: DC

## 2024-01-04 NOTE — Progress Notes (Signed)
 BP 138/79   Pulse (!) 59   Temp (!) 97.2 F (36.2 C)   Ht 5' 7 (1.702 m)   Wt 186 lb (84.4 kg)   SpO2 97%   BMI 29.13 kg/m    Subjective:   Patient ID: Jeffrey Rubio Nephew, male    DOB: 05/12/1945, 78 y.o.   MRN: [id number]  HPI: Jeffrey Rubio is a 78 y.o. male presenting on 01/04/2024 for Medical Management of Chronic Issues   Discussed the use of AI scribe software for clinical note transcription with the patient, who gave verbal consent to proceed.  History of Present Illness   Jeffrey Rubio is a 78 year old male who presents for a recheck of his blood sugar management.  Glycemic control - Uses Libre sensor for continuous glucose monitoring. - On Tresiba  22 units daily; increasing above 22 units causes nocturnal hypoglycemia with nighttime awakenings. - Blood glucose remains stable at current dose. - Recent glucose meter reading: 6.8; 90-day average: ~7.0. - Uses Farxiga  without major issues; previously experienced a yeast rash, managed by monitoring. - Eating out, especially at lunchtime, increases blood glucose due to extra sweetness in restaurant foods. - Increased blood glucose over the past 7 days due to more frequent eating out; 30-day and 90-day averages remain stable.  Renal and hepatic function monitoring - History of kidney function abnormality attributed to prior antibiotic use. - History of liver function abnormality with a previous medication. - Currently monitoring kidney function.  Lower urinary tract symptoms - Takes Flomax  for prostate-related symptoms. - Improved urine flow and urgency with Flomax . - No urine leakage or flow issues. - Experiences urinary urgency, especially nocturnally; attributes some urgency to Farxiga  use.  Lipid management and blood pressure control - Takes pravastatin  for hyperlipidemia and fish oil supplements. - Blood pressure is well-controlled at home.  Prostate cancer surveillance - Family history of  prostate cancer (brother affected). - Monitoring PSA levels, which have remained stable since December.  Sleep disturbance and anxiety - Difficulty maintaining sleep; uses Benadryl at night, which improves sleep quality. - Avoids daytime napping to prevent disruption of sleep cycle. - History of anxiety, which occasionally impacts sleep.          Relevant past medical, surgical, family and social history reviewed and updated as indicated. Interim medical history since our last visit reviewed. Allergies and medications reviewed and updated.  Review of Systems  Constitutional:  Negative for chills and fever.  Eyes:  Negative for visual disturbance.  Respiratory:  Negative for shortness of breath and wheezing.   Cardiovascular:  Negative for chest pain and leg swelling.  Musculoskeletal:  Negative for back pain and gait problem.  Skin:  Negative for rash.  Psychiatric/Behavioral:  Positive for sleep disturbance.   All other systems reviewed and are negative.   Per HPI unless specifically indicated above   Allergies as of 01/04/2024       Reactions   Farxiga  [dapagliflozin ] Other (See Comments)   Reports yeast in the groin upon initiation, but tolerating Farxiga  now & no concerns   Hydrocodone -acetaminophen     Causes Anxiety    Ozempic  (0.25 Or 0.5 Mg-dose) [semaglutide (0.25 Or 0.5mg -dos)] Other (See Comments)   Pancreatic insufficiency, GI--diarrhea        Medication List        Accurate as of January 04, 2024 10:27 AM. If you have any questions, ask your nurse or doctor.          STOP taking  these medications    PROBIOTIC-10 PO Stopped by: Fonda LABOR Samaya Boardley       TAKE these medications    Accu-Chek Aviva Plus w/Device Kit USE to check blood sugars daily   amLODipine  5 MG tablet Commonly known as: NORVASC  Take 1 tablet (5 mg total) by mouth daily.   aspirin  81 MG tablet Take 81 mg by mouth daily.   b complex vitamins capsule Take 1 capsule by  mouth daily.   BD Pen Needle Nano 2nd Gen 32G X 4 MM Misc Generic drug: Insulin  Pen Needle Use w/ Tresiba  Dx E11.9   dapagliflozin  propanediol 10 MG Tabs tablet Commonly known as: Farxiga  Take 1 tablet (10 mg total) by mouth daily before breakfast.   Fish Oil 1000 MG Caps Take 1,000 mg by mouth in the morning and at bedtime.   FreeStyle Libre 3 Plus Sensor Misc 1 each by Does not apply route See admin instructions. Place 1 sensor on back of arm every 15 days. DX: E11.65   irbesartan  300 MG tablet Commonly known as: AVAPRO  Take 1 tablet (300 mg total) by mouth daily.   meclizine  25 MG tablet Commonly known as: ANTIVERT  Take 1 tablet (25 mg total) by mouth 3 (three) times daily as needed for dizziness.   Metamucil 28.3 % Powd Generic drug: Psyllium Take 3 Scoops by mouth daily.   pravastatin  20 MG tablet Commonly known as: PRAVACHOL  Take 1 tablet (20 mg total) by mouth daily.   tamsulosin  0.4 MG Caps capsule Commonly known as: FLOMAX  Take 1 capsule (0.4 mg total) by mouth daily.   Tresiba  FlexTouch 100 UNIT/ML FlexTouch Pen Generic drug: insulin  degludec Inject 20-25 Units into the skin at bedtime. What changed: how much to take Changed by: Fonda LABOR Czar Ysaguirre   Vitamin D3 50 MCG (2000 UT) Tabs Take 4,000 Units by mouth every morning.         Objective:   BP 138/79   Pulse (!) 59   Temp (!) 97.2 F (36.2 C)   Ht 5' 7 (1.702 m)   Wt 186 lb (84.4 kg)   SpO2 97%   BMI 29.13 kg/m   Wt Readings from Last 3 Encounters:  01/04/24 186 lb (84.4 kg)  10/04/23 182 lb (82.6 kg)  06/20/23 184 lb (83.5 kg)    Physical Exam Physical Exam   VITALS: BP- 138/79 NECK: Thyroid  without nodules or enlargement. CHEST: Lungs clear to auscultation bilaterally. CARDIOVASCULAR: Regular heart rate and rhythm, no murmurs. EXTREMITIES: No edema in lower extremities. Peripheral pulses intact bilaterally.         Assessment & Plan:   Problem List Items Addressed This  Visit       Cardiovascular and Mediastinum   Hypertension associated with diabetes (HCC)   Relevant Medications   amLODipine  (NORVASC ) 5 MG tablet   insulin  degludec (TRESIBA  FLEXTOUCH) 100 UNIT/ML FlexTouch Pen     Endocrine   Hyperlipidemia associated with type 2 diabetes mellitus (HCC)   Relevant Medications   amLODipine  (NORVASC ) 5 MG tablet   insulin  degludec (TRESIBA  FLEXTOUCH) 100 UNIT/ML FlexTouch Pen   DM (diabetes mellitus) (HCC) - Primary   Relevant Medications   Continuous Glucose Sensor (FREESTYLE LIBRE 3 PLUS SENSOR) MISC   insulin  degludec (TRESIBA  FLEXTOUCH) 100 UNIT/ML FlexTouch Pen   Other Relevant Orders   Bayer DCA Hb A1c Waived   Other Visit Diagnoses       Encounter for immunization       Relevant Orders   Flu vaccine  HIGH DOSE PF(Fluzone Trivalent) (Completed)           Type 2 diabetes mellitus Type 2 diabetes managed with Tresiba  and Farxiga . A1c increased to 7.0%. Dietary influence noted. No urinary issues or yeast infections reported. - Continue Tresiba  at 22 units daily. - Continue Farxiga . - Advise dietary modifications to reduce eating out and monitor carbohydrate intake. - Monitor for any urinary issues or yeast infections.  Benign prostatic hyperplasia with lower urinary tract symptoms Benign prostatic hyperplasia with urgency, managed with Flomax . Symptoms may be exacerbated by Farxiga . - Continue Flomax  for urinary symptoms. - Monitor urinary symptoms and report any changes.  Hyperlipidemia Hyperlipidemia managed with pravastatin  and fish oils. - Continue pravastatin  and fish oils.  Insomnia Insomnia with difficulty staying asleep, possibly related to anxiety. Benadryl effective. Discussed trazodone if insomnia worsens. - Continue Benadryl at night as needed. - Avoid evening naps to improve sleep cycle. - Consider trazodone if insomnia persists or worsens.  Adult Wellness Visit Routine wellness visit. Blood pressure controlled at  138/79 mmHg. - Continue current medications and lifestyle modifications. - Schedule next wellness visit in three months.          Follow up plan: Return in about 3 months (around 04/05/2024), or if symptoms worsen or fail to improve, for Diabetes.  Counseling provided for all of the vaccine components Orders Placed This Encounter  Procedures   Flu vaccine HIGH DOSE PF(Fluzone Trivalent)   Bayer DCA Hb A1c Waived    Fonda Levins, MD [facility] 01/04/2024, 10:27 AM

## 2024-01-29 ENCOUNTER — Encounter: Payer: Self-pay | Admitting: Family Medicine

## 2024-01-30 MED ORDER — TOUJEO SOLOSTAR 300 UNIT/ML ~~LOC~~ SOPN: 20.0000 [IU] | PEN_INJECTOR | Freq: Every day | SUBCUTANEOUS | 3 refills | Status: AC

## 2024-02-11 ENCOUNTER — Other Ambulatory Visit: Payer: Self-pay | Admitting: Family Medicine

## 2024-02-11 DIAGNOSIS — E1169 Type 2 diabetes mellitus with other specified complication: Secondary | ICD-10-CM

## 2024-02-11 NOTE — Telephone Encounter (Signed)
 Copied from CRM [id number]. Topic: Clinical - Prescription Issue >> Feb 11, 2024  9:54 AM Zane F wrote: Reason for CRM:   Caller: Heron  Prescription In Question: Continuous Glucose Sensor (FREESTYLE LIBRE 3 PLUS SENSOR) MISC  Patient's spouse was told by Mliss Griffin, the pharmacist that if needed the above sensor could be left at the office for her to pick up for the patient. The patient's wife, Heron, is reaching out to have that request fulfilled and is asking for a call back to confirm when it is ok for her to pick up the sensor in question. Please confirm information with Mliss Griffin and contact the patient's spouse at the number listed below once the item is ready for pick up.   On November 17th the couple are suppose receive a new shipment but the patient requires a new one before then because they will be going on a trip for two weeks.   Callback Number: [phone]

## 2024-02-14 ENCOUNTER — Telehealth: Payer: Self-pay | Admitting: Family Medicine

## 2024-02-14 NOTE — Telephone Encounter (Signed)
 Copied from CRM [id number]. Topic: Clinical - Prescription Issue >> Feb 11, 2024  9:54 AM Zane F wrote: Reason for CRM:   Caller: Heron  Prescription In Question: Continuous Glucose Sensor (FREESTYLE LIBRE 3 PLUS SENSOR) MISC  Patient's spouse was told by Mliss Griffin, the pharmacist that if needed the above sensor could be left at the office for her to pick up for the patient. The patient's wife, Heron, is reaching out to have that request fulfilled and is asking for a call back to confirm when it is ok for her to pick up the sensor in question. Please confirm information with Mliss Griffin and contact the patient's spouse at the number listed below once the item is ready for pick up.   On November 17th the couple are suppose receive a new shipment but the patient requires a new one before then because they will be going on a trip for two weeks.   Callback Number: [id number] >> Feb 14, 2024 12:07 PM Delon DASEN wrote: Checking to see if the sensor is ready to be picked up- please call when ready- [phone]

## 2024-02-18 NOTE — Telephone Encounter (Signed)
 Sample left up front. Left message making pt aware.

## 2024-04-03 ENCOUNTER — Other Ambulatory Visit: Payer: Self-pay | Admitting: Medical Genetics

## 2024-04-10 ENCOUNTER — Encounter: Payer: Self-pay | Admitting: Family Medicine

## 2024-04-10 ENCOUNTER — Ambulatory Visit: Payer: Self-pay | Admitting: Family Medicine

## 2024-04-10 VITALS — BP 144/75 | HR 64 | Ht 67.0 in | Wt 186.0 lb

## 2024-04-10 DIAGNOSIS — E1169 Type 2 diabetes mellitus with other specified complication: Secondary | ICD-10-CM | POA: Diagnosis not present

## 2024-04-10 DIAGNOSIS — I152 Hypertension secondary to endocrine disorders: Secondary | ICD-10-CM | POA: Diagnosis not present

## 2024-04-10 DIAGNOSIS — N183 Chronic kidney disease, stage 3 unspecified: Secondary | ICD-10-CM | POA: Insufficient documentation

## 2024-04-10 DIAGNOSIS — Z794 Long term (current) use of insulin: Secondary | ICD-10-CM

## 2024-04-10 DIAGNOSIS — E1122 Type 2 diabetes mellitus with diabetic chronic kidney disease: Secondary | ICD-10-CM | POA: Diagnosis not present

## 2024-04-10 DIAGNOSIS — E1159 Type 2 diabetes mellitus with other circulatory complications: Secondary | ICD-10-CM | POA: Diagnosis not present

## 2024-04-10 DIAGNOSIS — E785 Hyperlipidemia, unspecified: Secondary | ICD-10-CM

## 2024-04-10 LAB — CBC WITH DIFFERENTIAL/PLATELET
Basophils Absolute: 0 x10E3/uL (ref 0.0–0.2)
Basos: 0 %
EOS (ABSOLUTE): 0.1 x10E3/uL (ref 0.0–0.4)
Eos: 1 %
Hematocrit: 47.8 % (ref 37.5–51.0)
Hemoglobin: 15.2 g/dL (ref 13.0–17.7)
Immature Grans (Abs): 0 x10E3/uL (ref 0.0–0.1)
Immature Granulocytes: 0 %
Lymphocytes Absolute: 2.3 x10E3/uL (ref 0.7–3.1)
Lymphs: 32 %
MCH: 29.3 pg (ref 26.6–33.0)
MCHC: 31.8 g/dL (ref 31.5–35.7)
MCV: 92 fL (ref 79–97)
Monocytes Absolute: 0.7 x10E3/uL (ref 0.1–0.9)
Monocytes: 9 %
Neutrophils Absolute: 4.3 x10E3/uL (ref 1.4–7.0)
Neutrophils: 58 %
Platelets: 251 x10E3/uL (ref 150–450)
RBC: 5.18 x10E6/uL (ref 4.14–5.80)
RDW: 13.1 % (ref 11.6–15.4)
WBC: 7.4 x10E3/uL (ref 3.4–10.8)

## 2024-04-10 LAB — LIPID PANEL
Chol/HDL Ratio: 3.1 ratio (ref 0.0–5.0)
Cholesterol, Total: 163 mg/dL (ref 100–199)
HDL: 52 mg/dL
LDL Chol Calc (NIH): 84 mg/dL (ref 0–99)
Triglycerides: 155 mg/dL — ABNORMAL HIGH (ref 0–149)
VLDL Cholesterol Cal: 27 mg/dL (ref 5–40)

## 2024-04-10 LAB — CMP14+EGFR
ALT: 21 IU/L (ref 0–44)
AST: 15 IU/L (ref 0–40)
Albumin: 4.2 g/dL (ref 3.8–4.8)
Alkaline Phosphatase: 95 IU/L (ref 47–123)
BUN/Creatinine Ratio: 16 (ref 10–24)
BUN: 23 mg/dL (ref 8–27)
Bilirubin Total: 0.5 mg/dL (ref 0.0–1.2)
CO2: 20 mmol/L (ref 20–29)
Calcium: 9.6 mg/dL (ref 8.6–10.2)
Chloride: 106 mmol/L (ref 96–106)
Creatinine, Ser: 1.41 mg/dL — ABNORMAL HIGH (ref 0.76–1.27)
Globulin, Total: 2.6 g/dL (ref 1.5–4.5)
Glucose: 104 mg/dL — ABNORMAL HIGH (ref 70–99)
Potassium: 4.4 mmol/L (ref 3.5–5.2)
Sodium: 140 mmol/L (ref 134–144)
Total Protein: 6.8 g/dL (ref 6.0–8.5)
eGFR: 51 mL/min/1.73 — ABNORMAL LOW

## 2024-04-10 LAB — TSH: TSH: 1.22 u[IU]/mL (ref 0.450–4.500)

## 2024-04-10 LAB — BAYER DCA HB A1C WAIVED: HB A1C (BAYER DCA - WAIVED): 6.5 % — ABNORMAL HIGH (ref 4.8–5.6)

## 2024-04-10 MED ORDER — GABAPENTIN 100 MG PO CAPS: 100.0000 mg | ORAL_CAPSULE | Freq: Every day | ORAL | 3 refills | Status: AC

## 2024-04-10 MED ORDER — BD PEN NEEDLE NANO 2ND GEN 32G X 4 MM MISC: 11 refills | Status: AC

## 2024-04-10 NOTE — Progress Notes (Signed)
 "  BP (!) 144/75   Pulse 64   Ht 5' 7 (1.702 m)   Wt 186 lb (84.4 kg)   SpO2 95%   BMI 29.13 kg/m    Subjective:   Patient ID: Jeffrey Rubio, male    DOB: 24-Apr-1945, 79 y.o.   MRN: [id number]  HPI: Jeffrey Rubio is a 79 y.o. male presenting on 04/10/2024 for Medical Management of Chronic Issues, Diabetes, and Hypertension   Discussed the use of AI scribe software for clinical note transcription with the patient, who gave verbal consent to proceed.  History of Present Illness   Jeffrey Rubio is a 79 year old male who presents for medication management and dizziness.  Dizziness and vertigo - Experiences intermittent dizziness attributed to vertigo - Practices vertigo exercises when symptoms worsen - Uses meclizine  as needed for symptom relief - Symptoms are exacerbated by looking down while working - No associated nausea or sensation of the room spinning - No episodes of syncope  Neuropathic pain - Considering initiation of gabapentin  for neuropathy pain management - Prefers to avoid daily use of meloxicam due to concerns about kidney function - Occasionally uses Tylenol  for pain relief, which he finds safe for his kidneys  Musculoskeletal pain - Intermittent hip pain attributed to an active lifestyle - Splits 15 mg meloxicam tablets in half and uses them sparingly for pain relief - Uses meloxicam particularly 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 Interim  medical history since our last visit reviewed. Allergies and medications reviewed and updated.  Review of Systems  Constitutional:  Negative for chills and fever.  Eyes:  Negative for visual disturbance.  Respiratory:  Negative for shortness of breath and wheezing.   Cardiovascular:  Negative for chest pain and leg swelling.  Musculoskeletal:  Positive for arthralgias. Negative for gait problem.  Skin:  Negative for rash.  Neurological:  Positive for dizziness and numbness. Negative for light-headedness.  All other systems reviewed and are negative.   Per HPI unless specifically indicated above   Allergies as of 04/10/2024       Reactions   Farxiga  [dapagliflozin ] Other (See Comments)   Reports yeast in the groin upon initiation, but tolerating Farxiga  now & no concerns   Hydrocodone -acetaminophen     Causes Anxiety    Ozempic  (0.25 Or 0.5 Mg-dose) [semaglutide (0.25 Or 0.5mg -dos)] Other (See Comments)   Pancreatic insufficiency, GI--diarrhea        Medication List        Accurate as of April 10, 2024 11:11 AM. If you have any questions, ask your nurse or doctor.          Accu-Chek Aviva Plus w/Device Kit USE to check blood sugars daily   amLODipine  5 MG tablet Commonly known as: NORVASC  Take 1 tablet (5 mg total) by mouth daily.   aspirin  81 MG tablet Take 81 mg by mouth daily.   b complex vitamins capsule Take 1 capsule by mouth daily.   BD Pen Needle Nano 2nd Gen 32G X 4  MM Misc Generic drug: Insulin  Pen Needle Use w/ Tresiba  Dx E11.9   dapagliflozin  propanediol 10 MG Tabs tablet Commonly known as: Farxiga  Take 1 tablet (10 mg total) by mouth daily before breakfast.   Fish Oil 1000 MG Caps Take 1,000 mg by mouth in the morning and at bedtime.   FreeStyle Libre 3 Plus Sensor Misc 1 each by Does not apply route See admin instructions. Place 1 sensor on back of arm every 15 days. DX: E11.65   irbesartan  300 MG tablet Commonly known as: AVAPRO  Take 1  tablet (300 mg total) by mouth daily.   meclizine  25 MG tablet Commonly known as: ANTIVERT  Take 1 tablet (25 mg total) by mouth 3 (three) times daily as needed for dizziness.   Metamucil 28.3 % Powd Generic drug: Psyllium Take 3 Scoops by mouth daily.   pravastatin  20 MG tablet Commonly known as: PRAVACHOL  Take 1 tablet (20 mg total) by mouth daily.   tamsulosin  0.4 MG Caps capsule Commonly known as: FLOMAX  Take 1 capsule (0.4 mg total) by mouth daily.   Toujeo  SoloStar 300 UNIT/ML Solostar Pen Generic drug: insulin  glargine (1 Unit Dial) Inject 20-25 Units into the skin at bedtime.   Vitamin D3 50 MCG (2000 UT) Tabs Take 4,000 Units by mouth every morning.         Objective:   BP (!) 144/75   Pulse 64   Ht 5' 7 (1.702 m)   Wt 186 lb (84.4 kg)   SpO2 95%   BMI 29.13 kg/m   Wt Readings from Last 3 Encounters:  04/10/24 186 lb (84.4 kg)  01/04/24 186 lb (84.4 kg)  10/04/23 182 lb (82.6 kg)    Physical Exam Vitals and nursing note reviewed.  Constitutional:      Appearance: Normal appearance.  Neurological:     Mental Status: He is alert.    Physical Exam   VITALS: BP- 144/75 CHEST: Lungs clear to auscultation. CARDIOVASCULAR: Heart sounds regular, no murmurs.       Results for orders placed or performed in visit on 01/08/24  OPHTHALMOLOGY REPORT-SCANNED   Collection Time: 05/08/23  1:07 PM  Result Value Ref Range   HM Diabetic Eye Exam Retinopathy (A) No Retinopathy    Assessment & Plan:   Problem List Items Addressed This Visit       Cardiovascular and Mediastinum   Hypertension associated with diabetes (HCC)   Relevant Orders   Bayer DCA Hb A1c Waived   CBC with Differential/Platelet   CMP14+EGFR   Lipid panel   TSH     Endocrine   Hyperlipidemia associated with type 2 diabetes mellitus (HCC) - Primary   Relevant Orders   Bayer DCA Hb A1c Waived   CBC with Differential/Platelet   CMP14+EGFR   Lipid panel   TSH   DM (diabetes  mellitus) (HCC)   CKD stage 3 due to type 2 diabetes mellitus (HCC)       Type 2 diabetes mellitus with complications Blood sugar levels well-controlled at 6.5. - Continue current insulin  regimen.  Chronic kidney disease stage 3 due to type 2 diabetes mellitus Gabapentin  preferred for pain management due to lower renal impact. Meloxicam used sparingly at lower dose. - Use gabapentin  as needed for neuropathy pain. - Use meloxicam sparingly at a lower dose if preferred.  Hypertension associated with diabetes Blood pressure slightly elevated at 144/75. Sodium intake a concern for management. - Monitor blood pressure regularly. - Reduce sodium intake, especially from processed foods  and restaurants.  Vertigo Intermittent vertigo likely exacerbated by physical activities. Meclizine  used as needed. Exercises effective in managing symptoms. - Perform vertigo exercises daily to prevent symptoms. - Use meclizine  as needed for vertigo symptoms.          Follow up plan: Return in about 3 months (around 07/09/2024), or if symptoms worsen or fail to improve, for Diabetes recheck.  Counseling provided for all of the vaccine components Orders Placed This Encounter  Procedures   Bayer DCA Hb A1c Waived   CBC with Differential/Platelet   CMP14+EGFR   Lipid panel   TSH    Fonda Levins, MD [facility] 04/10/2024, 11:11 AM     "

## 2024-04-17 ENCOUNTER — Ambulatory Visit: Payer: Self-pay | Admitting: Family Medicine

## 2024-05-01 ENCOUNTER — Ambulatory Visit

## 2024-05-01 VITALS — BP 144/75 | HR 64 | Ht 67.0 in | Wt 186.0 lb

## 2024-05-01 DIAGNOSIS — Z0000 Encounter for general adult medical examination without abnormal findings: Secondary | ICD-10-CM | POA: Diagnosis not present

## 2024-05-01 NOTE — Patient Instructions (Signed)
 Jeffrey Rubio,  Thank you for taking the time for your Medicare Wellness Visit. I appreciate your continued commitment to your health goals. Please review the care plan we discussed, and feel free to reach out if I can assist you further.  Please note that Annual Wellness Visits do not include a physical exam. Some assessments may be limited, especially if the visit was conducted virtually. If needed, we may recommend an in-person follow-up with your provider.  Ongoing Care Seeing your primary care provider every 3 to 6 months helps us  monitor your health and provide consistent, personalized care.   Referrals If a referral was made during today's visit and you haven't received any updates within two weeks, please contact the referred provider directly to check on the status.  Recommended Screenings:  Health Maintenance  Topic Date Due   COVID-19 Vaccine (3 - Moderna risk series) 05/26/2020   Kidney health urinalysis for diabetes  04/05/2024   Medicare Annual Wellness Visit  04/25/2024   Eye exam for diabetics  05/07/2024   Complete foot exam   10/03/2024   Hemoglobin A1C  10/08/2024   Yearly kidney function blood test for diabetes  04/10/2025   Colon Cancer Screening  03/24/2027   DTaP/Tdap/Td vaccine (2 - Td or Tdap) 09/28/2030   Pneumococcal Vaccine for age over 5  Completed   Flu Shot  Completed   Hepatitis C Screening  Completed   Zoster (Shingles) Vaccine  Completed   Meningitis B Vaccine  Aged Out       05/01/2024    1:28 PM  Advanced Directives  Does Patient Have a Medical Advance Directive? No  Would patient like information on creating a medical advance directive? No - Patient declined    Vision: Annual vision screenings are recommended for early detection of glaucoma, cataracts, and diabetic retinopathy. These exams can also reveal signs of chronic conditions such as diabetes and high blood pressure.  Dental: Annual dental screenings help detect early signs of oral  cancer, gum disease, and other conditions linked to overall health, including heart disease and diabetes.  Please see the attached documents for additional preventive care recommendations.

## 2024-05-01 NOTE — Progress Notes (Signed)
 "  Chief Complaint  Patient presents with   Medicare Wellness     Subjective:   Jeffrey Rubio is a 79 y.o. male who presents for a Medicare Annual Wellness Visit.  Visit info / Clinical Intake: Medicare Wellness Visit Type:: Subsequent Annual Wellness Visit Persons participating in visit and providing information:: patient Medicare Wellness Visit Mode:: Telephone If telephone:: video declined Since this visit was completed virtually, some vitals may be partially provided or unavailable. Missing vitals are due to the limitations of the virtual format.: Unable to obtain vitals - no equipment If Telephone or Video please confirm:: I connected with patient using audio/video enable telemedicine. I verified patient identity with two identifiers, discussed telehealth limitations, and patient agreed to proceed. Patient Location:: home Provider Location:: home office Interpreter Needed?: No Pre-visit prep was completed: yes AWV questionnaire completed by patient prior to visit?: no Living arrangements:: lives with spouse/significant other Typical amount of pain: none Does pain affect daily life?: no Are you currently prescribed opioids?: no  Dietary Habits and Nutritional Risks How many meals a day?: 3 Eats fruit and vegetables daily?: yes Most meals are obtained by: preparing own meals In the last 2 weeks, have you had any of the following?: none Diabetic:: (!) yes Any non-healing wounds?: no How often do you check your BS?: continuous glucose monitor Would you like to be referred to a Nutritionist or for Diabetic Management? : no  Functional Status Activities of Daily Living (to include ambulation/medication): Independent Ambulation: Independent Medication Administration: Independent Home Management (perform basic housework or laundry): Independent Manage your own finances?: yes Primary transportation is: driving Concerns about vision?: no *vision screening is required for  WTM* (2025) Concerns about hearing?: (!) yes Uses hearing aids?: no  Fall Screening Falls in the past year?: 1 Number of falls in past year: 0 Was there an injury with Fall?: 0 Fall Risk Category Calculator: 1 Patient Fall Risk Level: Low Fall Risk  Fall Risk Patient at Risk for Falls Due to: Impaired mobility; No Fall Risks Fall risk Follow up: Falls evaluation completed; Education provided  Home and Transportation Safety: All rugs have non-skid backing?: yes All stairs or steps have railings?: yes Grab bars in the bathtub or shower?: yes Have non-skid surface in bathtub or shower?: yes Good home lighting?: yes Regular seat belt use?: yes Hospital stays in the last year:: no  Cognitive Assessment Difficulty concentrating, remembering, or making decisions? : yes Will 6CIT or Mini Cog be Completed: yes What year is it?: 0 points What month is it?: 0 points Give patient an address phrase to remember (5 components): [address] [place] About what time is it?: 0 points Count backwards from 20 to 1: 0 points Say the months of the year in reverse: 0 points Repeat the address phrase from earlier: 0 points 6 CIT Score: 0 points  Advance Directives (For Healthcare) Does Patient Have a Medical Advance Directive?: No Would patient like information on creating a medical advance directive?: No - Patient declined  Reviewed/Updated  Reviewed/Updated: Reviewed All (Medical, Surgical, Family, Medications, Allergies, Care Teams, Patient Goals)    Allergies (verified) Farxiga  [dapagliflozin ], Hydrocodone -acetaminophen , and Ozempic  (0.25 or 0.5 mg-dose) [semaglutide (0.25 or 0.5mg -dos)]   Current Medications (verified) Outpatient Encounter Medications as of 05/01/2024  Medication Sig   amLODipine  (NORVASC ) 5 MG tablet Take 1 tablet (5 mg total) by mouth daily.   aspirin  81 MG tablet Take 81 mg by mouth daily.   b complex vitamins capsule Take 1  capsule by mouth daily.    Blood Glucose Monitoring Suppl (ACCU-CHEK AVIVA PLUS) w/Device KIT USE to check blood sugars daily   Cholecalciferol (VITAMIN D3) 2000 units TABS Take 4,000 Units by mouth every morning.   Continuous Glucose Sensor (FREESTYLE LIBRE 3 PLUS SENSOR) MISC 1 each by Does not apply route See admin instructions. Place 1 sensor on back of arm every 15 days. DX: E11.65   dapagliflozin  propanediol (FARXIGA ) 10 MG TABS tablet Take 1 tablet (10 mg total) by mouth daily before breakfast.   gabapentin  (NEURONTIN ) 100 MG capsule Take 1 capsule (100 mg total) by mouth at bedtime.   insulin  glargine, 1 Unit Dial, (TOUJEO  SOLOSTAR) 300 UNIT/ML Solostar Pen Inject 20-25 Units into the skin at bedtime.   Insulin  Pen Needle (BD PEN NEEDLE NANO 2ND GEN) 32G X 4 MM MISC Use w/ Tresiba  Dx E11.9   irbesartan  (AVAPRO ) 300 MG tablet Take 1 tablet (300 mg total) by mouth daily.   meclizine  (ANTIVERT ) 25 MG tablet Take 1 tablet (25 mg total) by mouth 3 (three) times daily as needed for dizziness.   Omega-3 Fatty Acids (FISH OIL) 1000 MG CAPS Take 1,000 mg by mouth in the morning and at bedtime.   pravastatin  (PRAVACHOL ) 20 MG tablet Take 1 tablet (20 mg total) by mouth daily.   Psyllium (METAMUCIL) 28.3 % POWD Take 3 Scoops by mouth daily.   tamsulosin  (FLOMAX ) 0.4 MG CAPS capsule Take 1 capsule (0.4 mg total) by mouth daily.   No facility-administered encounter medications on file as of 05/01/2024.    History: Past Medical History:  Diagnosis Date   Asthmatic bronchitis    BPH (benign prostatic hypertrophy)    Cataracts, bilateral    Colon polyps    Diabetes mellitus    Diverticulosis    Fibromyalgia    GERD (gastroesophageal reflux disease)    Gout    History of nephrectomy, unilateral    left    Hyperlipidemia    Hypertension    Single kidney    Past Surgical History:  Procedure Laterality Date   APPENDECTOMY     CHOLECYSTECTOMY     COLONOSCOPY W/ POLYPECTOMY     EYE SURGERY     catarat - right     hand fracture Left    plate in right hand from when he crushed it in a hay baler   LEFT HEART CATH AND CORONARY ANGIOGRAPHY N/A 01/11/2017   Procedure: LEFT HEART CATH AND CORONARY ANGIOGRAPHY;  Surgeon: Jordan, Peter M, MD;  Location: [facility];  Service: Cardiovascular;  Laterality: N/A;   LUMBAR LAMINECTOMY N/A 07/29/2021   Procedure: RIGHT L3-4 MICRODISCECTOMY, and removal of fragment;  Surgeon: Barbarann Oneil BROCKS, MD;  Location: [facility];  Service: Orthopedics;  Laterality: N/A;   NEPHRECTOMY  1996   left , secondary to nephrolithiasis    ROTATOR CUFF REPAIR Right    Family History  Problem Relation Age of Onset   Coronary artery disease Other        family history    Hypertension Other        family history    Alzheimer's disease Mother 29   Cancer Father        prostate   Stroke Father    CAD Father 28       CABG   Hypertension Sister    Hypercalcemia Brother    Hypertension Brother    Kidney Stones Son    Breast cancer Maternal Grandmother  Diabetes Paternal Grandmother    Other Paternal Grandmother        Crite's disease   Suicidality Paternal Grandfather    Social History   Occupational History   Occupation: retired    Comment: owns a farm  Tobacco Use   Smoking status: Former    Current packs/day: 0.00    Average packs/day: 1 pack/day for 12.0 years (12.0 ttl pk-yrs)    Types: Cigarettes    Start date: 04/03/1958    Quit date: 04/03/1970    Years since quitting: 54.1   Smokeless tobacco: Never  Vaping Use   Vaping status: Never Used  Substance and Sexual Activity   Alcohol use: No   Drug use: No   Sexual activity: Not Currently   Tobacco Counseling Counseling given: Yes  SDOH Screenings   Food Insecurity: No Food Insecurity (05/01/2024)  Housing: Low Risk (04/09/2024)  Transportation Needs: No Transportation Needs (05/01/2024)  Utilities: Not At Risk (05/01/2024)  Alcohol Screen: Low Risk (04/09/2024)  Depression (PHQ2-9): Low Risk (05/01/2024)   Financial Resource Strain: Low Risk (04/09/2024)  Physical Activity: Insufficiently Active (05/01/2024)  Social Connections: Socially Integrated (05/01/2024)  Stress: No Stress Concern Present (05/01/2024)  Tobacco Use: Medium Risk (05/01/2024)  Health Literacy: Adequate Health Literacy (05/01/2024)   See flowsheets for full screening details  Depression Screen PHQ 2 & 9 Depression Scale- Over the past 2 weeks, how often have you been bothered by any of the following problems? Little interest or pleasure in doing things: 0 Feeling down, depressed, or hopeless (PHQ Adolescent also includes...irritable): 0 PHQ-2 Total Score: 0 Trouble falling or staying asleep, or sleeping too much: 0 Feeling tired or having little energy: 0 Poor appetite or overeating (PHQ Adolescent also includes...weight loss): 0 Feeling bad about yourself - or that you are a failure or have let yourself or your family down: 0 Trouble concentrating on things, such as reading the newspaper or watching television (PHQ Adolescent also includes...like school work): 0 Moving or speaking so slowly that other people could have noticed. Or the opposite - being so fidgety or restless that you have been moving around a lot more than usual: 0 Thoughts that you would be better off dead, or of hurting yourself in some way: 0 PHQ-9 Total Score: 0 If you checked off any problems, how difficult have these problems made it for you to do your work, take care of things at home, or get along with other people?: Not difficult at all     Goals Addressed             This Visit's Progress    Remain active and independent   On track            Objective:    Today's Vitals   05/01/24 1407  BP: (!) 144/75  Pulse: 64  Weight: 186 lb (84.4 kg)  Height: 5' 7 (1.702 m)   Body mass index is 29.13 kg/m.  Hearing/Vision screen No results found. Immunizations and Health Maintenance Health Maintenance  Topic Date Due   COVID-19  Vaccine (3 - Moderna risk series) 05/26/2020   Diabetic kidney evaluation - Urine ACR  04/05/2024   OPHTHALMOLOGY EXAM  05/07/2024   FOOT EXAM  10/03/2024   HEMOGLOBIN A1C  10/08/2024   Diabetic kidney evaluation - eGFR measurement  04/10/2025   Medicare Annual Wellness (AWV)  05/01/2025   Colonoscopy  03/24/2027   DTaP/Tdap/Td (2 - Td or Tdap) 09/28/2030   Pneumococcal Vaccine: 50+ Years  Completed   Influenza Vaccine  Completed   Hepatitis C Screening  Completed   Zoster Vaccines- Shingrix   Completed   Meningococcal B Vaccine  Aged Out        Assessment/Plan:  This is a routine wellness examination for Jeffrey Rubio.  Patient Care Team: Dettinger, Fonda LABOR, MD as PCP - General (Family Medicine) Tanda Norleen HERO, MD as Referring Physician (Urology) Maree Lonni Inks, MD as Consulting Physician (Ophthalmology) Willy, Dempsey PARAS, MD as Referring Physician (Gastroenterology) Billee Mliss BIRCH, RPH-CPP as Pharmacist (Family Medicine)  I have personally reviewed and noted the following in the patients chart:   Medical and social history Use of alcohol, tobacco or illicit drugs  Current medications and supplements including opioid prescriptions. Functional ability and status Nutritional status Physical activity Advanced directives List of other physicians Hospitalizations, surgeries, and ER visits in previous 12 months Vitals Screenings to include cognitive, depression, and falls Referrals and appointments  No orders of the defined types were placed in this encounter.  In addition, I have reviewed and discussed with patient certain preventive protocols, quality metrics, and best practice recommendations. A written personalized care plan for preventive services as well as general preventive health recommendations were provided to patient.   Jeffrey Rubio, CMA   05/01/2024   Return in 1 year (on 05/01/2025).  After Visit Summary: (MyChart) Due to this being a telephonic visit,  the after visit summary with patients personalized plan was offered to patient via MyChart   Nurse Notes: Pt is due UrineACR, Covid vaccine--pt declined "

## 2024-07-10 ENCOUNTER — Ambulatory Visit: Admitting: Family Medicine
